# Patient Record
Sex: Female | Born: 1935 | Race: Black or African American | Hispanic: No | Smoking: Former smoker
Health system: Southern US, Community
[De-identification: ages and names within clinical notes are randomized; demographics above are authoritative.]

## PROBLEM LIST (undated history)

## (undated) DIAGNOSIS — D496 Neoplasm of unspecified behavior of brain: Secondary | ICD-10-CM

## (undated) DIAGNOSIS — K635 Polyp of colon: Secondary | ICD-10-CM

## (undated) DIAGNOSIS — J45909 Unspecified asthma, uncomplicated: Secondary | ICD-10-CM

## (undated) DIAGNOSIS — F0390 Unspecified dementia without behavioral disturbance: Secondary | ICD-10-CM

## (undated) DIAGNOSIS — M1990 Unspecified osteoarthritis, unspecified site: Secondary | ICD-10-CM

## (undated) DIAGNOSIS — K5900 Constipation, unspecified: Secondary | ICD-10-CM

## (undated) DIAGNOSIS — I10 Essential (primary) hypertension: Secondary | ICD-10-CM

## (undated) DIAGNOSIS — F1010 Alcohol abuse, uncomplicated: Secondary | ICD-10-CM

## (undated) DIAGNOSIS — Z531 Procedure and treatment not carried out because of patient's decision for reasons of belief and group pressure: Secondary | ICD-10-CM

## (undated) DIAGNOSIS — I671 Cerebral aneurysm, nonruptured: Secondary | ICD-10-CM

## (undated) DIAGNOSIS — IMO0001 Reserved for inherently not codable concepts without codable children: Secondary | ICD-10-CM

## (undated) HISTORY — DX: Unspecified osteoarthritis, unspecified site: M19.90

## (undated) HISTORY — PX: ABDOMINAL HYSTERECTOMY: SHX81

## (undated) HISTORY — DX: Constipation, unspecified: K59.00

## (undated) HISTORY — DX: Essential (primary) hypertension: I10

## (undated) HISTORY — DX: Unspecified dementia, unspecified severity, without behavioral disturbance, psychotic disturbance, mood disturbance, and anxiety: F03.90

## (undated) HISTORY — PX: BRAIN SURGERY: SHX531

## (undated) HISTORY — DX: Polyp of colon: K63.5

---

## 2008-02-14 ENCOUNTER — Ambulatory Visit: Payer: Self-pay | Admitting: Gastroenterology

## 2008-02-14 DIAGNOSIS — R933 Abnormal findings on diagnostic imaging of other parts of digestive tract: Secondary | ICD-10-CM | POA: Insufficient documentation

## 2008-02-14 DIAGNOSIS — D649 Anemia, unspecified: Secondary | ICD-10-CM | POA: Insufficient documentation

## 2008-02-19 ENCOUNTER — Encounter: Payer: Self-pay | Admitting: Gastroenterology

## 2008-02-19 ENCOUNTER — Ambulatory Visit: Payer: Self-pay | Admitting: Gastroenterology

## 2008-02-20 ENCOUNTER — Encounter: Payer: Self-pay | Admitting: Gastroenterology

## 2008-03-04 ENCOUNTER — Encounter: Payer: Self-pay | Admitting: Gastroenterology

## 2009-06-07 DIAGNOSIS — K635 Polyp of colon: Secondary | ICD-10-CM

## 2009-06-07 HISTORY — DX: Polyp of colon: K63.5

## 2011-02-05 ENCOUNTER — Encounter: Payer: Self-pay | Admitting: Gastroenterology

## 2012-02-25 ENCOUNTER — Encounter: Payer: Self-pay | Admitting: Gastroenterology

## 2013-09-06 ENCOUNTER — Emergency Department (HOSPITAL_COMMUNITY)
Admission: EM | Admit: 2013-09-06 | Discharge: 2013-09-06 | Disposition: A | Discharge status: Home / Self Care | Payer: Medicare Other | Attending: Emergency Medicine | Admitting: Emergency Medicine

## 2013-09-06 ENCOUNTER — Encounter (HOSPITAL_COMMUNITY): Payer: Self-pay | Admitting: Emergency Medicine

## 2013-09-06 ENCOUNTER — Emergency Department (HOSPITAL_COMMUNITY): Payer: Medicare Other

## 2013-09-06 DIAGNOSIS — Z86011 Personal history of benign neoplasm of the brain: Secondary | ICD-10-CM | POA: Insufficient documentation

## 2013-09-06 DIAGNOSIS — Z79899 Other long term (current) drug therapy: Secondary | ICD-10-CM | POA: Insufficient documentation

## 2013-09-06 DIAGNOSIS — Z8669 Personal history of other diseases of the nervous system and sense organs: Secondary | ICD-10-CM | POA: Insufficient documentation

## 2013-09-06 DIAGNOSIS — F10229 Alcohol dependence with intoxication, unspecified: Secondary | ICD-10-CM | POA: Insufficient documentation

## 2013-09-06 DIAGNOSIS — E876 Hypokalemia: Secondary | ICD-10-CM | POA: Insufficient documentation

## 2013-09-06 DIAGNOSIS — F17200 Nicotine dependence, unspecified, uncomplicated: Secondary | ICD-10-CM | POA: Insufficient documentation

## 2013-09-06 HISTORY — DX: Neoplasm of unspecified behavior of brain: D49.6

## 2013-09-06 HISTORY — DX: Alcohol abuse, uncomplicated: F10.10

## 2013-09-06 HISTORY — DX: Cerebral aneurysm, nonruptured: I67.1

## 2013-09-06 LAB — COMPREHENSIVE METABOLIC PANEL
ALBUMIN: 3.1 g/dL — AB (ref 3.5–5.2)
ALK PHOS: 94 U/L (ref 39–117)
ALT: 9 U/L (ref 0–35)
AST: 25 U/L (ref 0–37)
BUN: 8 mg/dL (ref 6–23)
CO2: 22 mEq/L (ref 19–32)
Calcium: 8.8 mg/dL (ref 8.4–10.5)
Chloride: 105 mEq/L (ref 96–112)
Creatinine, Ser: 0.69 mg/dL (ref 0.50–1.10)
GFR calc Af Amer: >90 mL/min (ref >90)
GFR calc non Af Amer: 81 mL/min — ABNORMAL LOW (ref >90)
Glucose, Bld: 118 mg/dL — ABNORMAL HIGH (ref 70–99)
POTASSIUM: 2.6 meq/L — AB (ref 3.7–5.3)
SODIUM: 149 meq/L — AB (ref 137–147)
TOTAL PROTEIN: 7.4 g/dL (ref 6.0–8.3)
Total Bilirubin: 0.4 mg/dL (ref 0.3–1.2)

## 2013-09-06 LAB — URINALYSIS, ROUTINE W REFLEX MICROSCOPIC
BILIRUBIN URINE: NEGATIVE
Glucose, UA: NEGATIVE mg/dL
Ketones, ur: NEGATIVE mg/dL
NITRITE: NEGATIVE
PH: 5.5 (ref 5.0–8.0)
Protein, ur: 100 mg/dL — AB
Urobilinogen, UA: 0.2 mg/dL (ref 0.0–1.0)

## 2013-09-06 LAB — URINE MICROSCOPIC-ADD ON

## 2013-09-06 LAB — CBC WITH DIFFERENTIAL/PLATELET
BASOS ABS: 0.1 10*3/uL (ref 0.0–0.1)
BASOS PCT: 1 % (ref 0–1)
EOS ABS: 0.4 10*3/uL (ref 0.0–0.7)
Eosinophils Relative: 8 % — ABNORMAL HIGH (ref 0–5)
HCT: 29.4 % — ABNORMAL LOW (ref 36.0–46.0)
Hemoglobin: 9.6 g/dL — ABNORMAL LOW (ref 12.0–15.0)
Lymphocytes Relative: 55 % — ABNORMAL HIGH (ref 12–46)
Lymphs Abs: 2.6 10*3/uL (ref 0.7–4.0)
MCH: 24.4 pg — AB (ref 26.0–34.0)
MCHC: 32.7 g/dL (ref 30.0–36.0)
MCV: 74.6 fL — ABNORMAL LOW (ref 78.0–100.0)
MONOS PCT: 9 % (ref 3–12)
Monocytes Absolute: 0.4 10*3/uL (ref 0.1–1.0)
NEUTROS PCT: 27 % — AB (ref 43–77)
Neutro Abs: 1.3 10*3/uL — ABNORMAL LOW (ref 1.7–7.7)
PLATELETS: 317 10*3/uL (ref 150–400)
RBC: 3.94 MIL/uL (ref 3.87–5.11)
RDW: 17.7 % — AB (ref 11.5–15.5)
WBC: 4.8 10*3/uL (ref 4.0–10.5)

## 2013-09-06 LAB — ETHANOL: ALCOHOL ETHYL (B): 319 mg/dL — AB (ref 0–11)

## 2013-09-06 MED ORDER — POTASSIUM CHLORIDE 10 MEQ/100ML IV SOLN: 10.0000 meq | INTRAVENOUS | Status: DC

## 2013-09-06 MED ORDER — POTASSIUM CHLORIDE CRYS ER 20 MEQ PO TBCR
40.0000 meq | EXTENDED_RELEASE_TABLET | Freq: Once | ORAL | Status: AC
  Administered 2013-09-06: 40 meq via ORAL
  Filled 2013-09-06: qty 2

## 2013-09-06 MED ORDER — SODIUM CHLORIDE 0.9 % IV BOLUS (SEPSIS): 1000.0000 mL | Freq: Once | INTRAVENOUS | Status: DC

## 2013-09-06 MED ORDER — POTASSIUM CHLORIDE CRYS ER 20 MEQ PO TBCR: 20.0000 meq | EXTENDED_RELEASE_TABLET | Freq: Every day | ORAL | Status: DC

## 2013-09-06 NOTE — ED Notes (Signed)
Critical Lab Value called to NF RN. Acuity changed to 2

## 2013-09-06 NOTE — ED Notes (Signed)
Pt. reports left sided headache onset yesterday , denies nausea or vomitting , no head injury or blurred vision . History of brain tumor , drank 1/2 pint of brandy today ( drinks everyday ) , disoriented to time / speech clear / no facial asymmetry / no arm drift.

## 2013-09-06 NOTE — ED Notes (Signed)
IV team paged to start IV.   

## 2013-09-06 NOTE — ED Notes (Signed)
Urine was clean catch

## 2013-09-06 NOTE — ED Provider Notes (Signed)
CSN: [id number]     Arrival date & time 09/06/13  1934 History   First MD Initiated Contact with Patient 09/06/13 2126     Chief Complaint  Patient presents with  . Headache     (Consider location/radiation/quality/duration/timing/severity/associated sxs/prior Treatment) HPI Comments: 80 y.o. AAF w/ PMHx of Brain tumor, Aneurysm s/p, EtOhism w/ cc: of HA. Pt comes in with daughter. Complaining of L sided HA for 2 days, now asymptomatic.. States its a dull pain on L side of head. Has hx of similar. Denies n/v, abd pain, chest pain, weakness, numbness. Has hx of aneurysm many years ago that was operated on. Also states she was in hospital admitted in [place] and was diagnosed with a "brain tumor" but was not to worry about it. Currently just moved here 1 month aago and not followed by anyone. Pt is an alcoholic and drank a pint and a half today.  Patient is a 80 y.o. female presenting with headaches. The history is provided by the patient and a relative.  Headache Pain location:  L temporal Quality:  Unable to specify Radiates to:  Does not radiate Pain severity now: none currently. Severity at highest:  Unable to specify Onset quality:  Gradual Duration:  2 days Timing:  Constant Progression:  Resolved Chronicity:  New Similar to prior headaches: yes   Context: not activity, not exposure to bright light, not eating, not loud noise and not straining   Relieved by:  Nothing Worsened by:  Nothing tried Ineffective treatments:  None tried Associated symptoms: no abdominal pain, no back pain, no congestion, no cough, no fever, no loss of balance, no nausea, no neck stiffness, no numbness, no photophobia, no seizures, no visual change, no vomiting and no weakness   Risk factors comment:  ? hx of brain tumor   Past Medical History  Diagnosis Date  . Alcohol abuse   . Brain tumor   . Brain aneurysm    History reviewed. No pertinent past surgical history. No family history on file. History   Substance Use Topics  . Smoking status: Never Smoker   . Smokeless tobacco: Current User    Types: Snuff  . Alcohol Use: Yes   OB History   Grav Para Term Preterm Abortions TAB SAB Ect Mult Living                 Review of Systems  Constitutional: Negative for fever, activity change and appetite change.  HENT: Negative for congestion and rhinorrhea.   Eyes: Negative for photophobia, discharge, redness and itching.  Respiratory: Negative for cough, shortness of breath and wheezing.   Cardiovascular: Negative for chest pain and palpitations.  Gastrointestinal: Negative for nausea, vomiting and abdominal pain.  Genitourinary: Negative for dysuria and decreased urine volume.  Musculoskeletal: Negative for back pain and neck stiffness.  Skin: Negative for rash.  Neurological: Positive for headaches. Negative for seizures, syncope, weakness, numbness and loss of balance.      Allergies  Fexofenadine  Home Medications   Current Outpatient Rx  Name  Route  Sig  Dispense  Refill  . acetaminophen (TYLENOL) 500 MG tablet   Oral   Take 500 mg by mouth daily as needed (pain).         Marland Kitchen docusate sodium (COLACE) 100 MG capsule   Oral   Take 100 mg by mouth daily as needed for mild constipation.         . magnesium hydroxide (MILK OF MAGNESIA) 400 MG/5ML suspension  Oral   Take by mouth daily as needed for mild constipation.         Marland Kitchen PRESCRIPTION MEDICATION   Oral   Take 1 tablet by mouth daily. Blood pressure medication         . PRESCRIPTION MEDICATION   Oral   Take 1 tablet by mouth daily. Heart medication         . potassium chloride SA (K-DUR,KLOR-CON) 20 MEQ tablet   Oral   Take 1 tablet (20 mEq total) by mouth daily.   30 tablet   0    BP 150/69  Pulse 90  Temp(Src) 99.1 F (37.3 C) (Oral)  Resp 16  SpO2 99% Physical Exam  Constitutional: She is oriented to person, place, and time. She appears well-developed and well-nourished. No distress.  Pt  appears intoxicated, very pleasant, joking.  HENT:  Head: Normocephalic and atraumatic.  Mouth/Throat: Oropharynx is clear and moist.  Eyes: Conjunctivae and EOM are normal. Pupils are equal, round, and reactive to light. Right eye exhibits no discharge. Left eye exhibits no discharge. No scleral icterus.  Neck: Normal range of motion. Neck supple.  Cardiovascular: Normal rate, regular rhythm and intact distal pulses.  Exam reveals no gallop and no friction rub.   No murmur heard. Pulmonary/Chest: Effort normal and breath sounds normal. No respiratory distress. She has no wheezes. She has no rales.  Abdominal: Soft. She exhibits no distension and no mass. There is no tenderness.  Musculoskeletal: Normal range of motion.  Neurological: She is alert and oriented to person, place, and time. No cranial nerve deficit. She exhibits normal muscle tone. Coordination normal.  5/5 strength in all exts, normal sensation in all exts, 2+ DTRs in patella and brachioradilias b/l, able to ambulate, mild ataxia on F2N b/l c/w EtOh intoxciation, CNs II-XII intcat  Skin: She is not diaphoretic.    ED Course  Procedures (including critical care time) Labs Review Labs Reviewed  CBC WITH DIFFERENTIAL - Abnormal; Notable for the following:    Hemoglobin 9.6 (*)    HCT 29.4 (*)    MCV 74.6 (*)    MCH 24.4 (*)    RDW 17.7 (*)    Neutrophils Relative % 27 (*)    Neutro Abs 1.3 (*)    Lymphocytes Relative 55 (*)    Eosinophils Relative 8 (*)    All other components within normal limits  COMPREHENSIVE METABOLIC PANEL - Abnormal; Notable for the following:    Sodium 149 (*)    Potassium 2.6 (*)    Glucose, Bld 118 (*)    Albumin 3.1 (*)    GFR calc non Af Amer 81 (*)    All other components within normal limits  ETHANOL - Abnormal; Notable for the following:    Alcohol, Ethyl (B) 319 (*)    All other components within normal limits  URINALYSIS, ROUTINE W REFLEX MICROSCOPIC - Abnormal; Notable for the  following:    APPearance CLOUDY (*)    Specific Gravity, Urine >1.030 (*)    Hgb urine dipstick TRACE (*)    Protein, ur 100 (*)    Leukocytes, UA TRACE (*)    All other components within normal limits  URINE MICROSCOPIC-ADD ON - Abnormal; Notable for the following:    Squamous Epithelial / LPF FEW (*)    Bacteria, UA FEW (*)    All other components within normal limits   Imaging Review Ct Head Wo Contrast  09/06/2013   CLINICAL DATA:  Left-sided  headache.  History of brain tumor.  EXAM: CT HEAD WITHOUT CONTRAST  TECHNIQUE: Contiguous axial images were obtained from the base of the skull through the vertex without intravenous contrast.  COMPARISON:  None available for comparison at time of study interpretation.  FINDINGS: No intraparenchymal hemorrhage, mass effect, midline shift. Status post left frontal craniectomy with apparent dural flap. The ventricles and sulci are overall normal for patient's age with mild disproportionate cerebellar volume loss as evidenced by prominent cerebellar folia. Patchy to confluent supratentorial white matter hypodensities.  No abnormal extra-axial fluid collections. Moderate to severe calcific atherosclerosis of the carotid siphons. Status post bilateral ocular lens implants.  Mild bony wall thickening of the maxillary sinuses suggest chronic sinusitis with pan paranasal sinus mucosal thickening. Remote left medial orbital blowout fracture. No skull fracture. Mastoid air cells are well aerated.  IMPRESSION: No acute intracranial process.  Status post left frontal craniectomy for probable extra-axial tumor resection.  Moderate global brain atrophy, with somewhat disproportionate cerebellar volume loss. Moderate to severe white matter changes suggest chronic small vessel ischemic disease.   Electronically Signed   By: Elon Alas   On: 09/06/2013 22:05     EKG Interpretation None      MDM   MDM 80 y.o. AAF w/ PMHx of EtOH abuse, ?brain tumor, hx of  aneurysms, comes in w/ HA. 2 days of L sided dull HA. None currently. Denies weaknes,s numbness, n/v/d, abd pain, chest pain, SOB. Pt AFVSS, intoxicated here, NAD otherwise. No neuro deficits. Labs show mild hypokalemia, dehydrated, hypernatremia. CT scan with post surgical changes. Pt's daughter states they jut moved from [place] 1 month ago and while up there had a hospital stay for a "brain tumor" that they decided to not do anything for. Plan to give fluids, and po K+. Pt alert, oriented, intoxicated, refusing IV. Pt states she feels fine and is ready to leave after CT scan. D/w CT with patient and daughter that shows no obvious lesion but MRI is test of choice. Pt hypokalemia likely incidental, dehydrated likely from EtOH. Pt refusing IV fluids, and daughter wants to bring patient home. Will give KDUr here and instruct to take K daily, rx given. Instructed to drink fluids. Given information for f/u w/ a PCP. Afebrile, no current ha, no neck pain making meningitis unlikely. No HA currently, making ICH unlikely, esp w/ negative Head CT. No obvious malignancy, no signs of increased ICP. Will dishcarge. Care of case d/w my attending.  Final diagnoses:  Hypokalemia    Discharged   Sol Passer, MD 09/06/13 2336

## 2013-09-06 NOTE — Discharge Instructions (Signed)
Hypokalemia Hypokalemia means that the amount of potassium in the blood is lower than normal.Potassium is a chemical, called an electrolyte, that helps regulate the amount of fluid in the body. It also stimulates muscle contraction and helps nerves function properly.Most of the body's potassium is inside of cells, and only a very small amount is in the blood. Because the amount in the blood is so small, minor changes can be life-threatening. CAUSES  Antibiotics.  Diarrhea or vomiting.  Using laxatives too much, which can cause diarrhea.  Chronic kidney disease.  Water pills (diuretics).  Eating disorders (bulimia).  Low magnesium level.  Sweating a lot. SIGNS AND SYMPTOMS  Weakness.  Constipation.  Fatigue.  Muscle cramps.  Mental confusion.  Skipped heartbeats or irregular heartbeat (palpitations).  Tingling or numbness. DIAGNOSIS  Your health care provider can diagnose hypokalemia with blood tests. In addition to checking your potassium level, your health care provider may also check other lab tests. TREATMENT Hypokalemia can be treated with potassium supplements taken by mouth or adjustments in your current medicines. If your potassium level is very low, you may need to get potassium through a vein (IV) and be monitored in the hospital. A diet high in potassium is also helpful. Foods high in potassium are:  Nuts, such as peanuts and pistachios.  Seeds, such as sunflower seeds and pumpkin seeds.  Peas, lentils, and lima beans.  Whole grain and bran cereals and breads.  Fresh fruit and vegetables, such as apricots, avocado, bananas, cantaloupe, kiwi, oranges, tomatoes, asparagus, and potatoes.  Orange and tomato juices.  Red meats.  Fruit yogurt. HOME CARE INSTRUCTIONS  Take all medicines as prescribed by your health care provider.  Maintain a healthy diet by including nutritious food, such as fruits, vegetables, nuts, whole grains, and lean meats.  If  you are taking a laxative, be sure to follow the directions on the label. SEEK MEDICAL CARE IF:  Your weakness gets worse.  You feel your heart pounding or racing.  You are vomiting or having diarrhea.  You are diabetic and having trouble keeping your blood glucose in the normal range. SEEK IMMEDIATE MEDICAL CARE IF:  You have chest pain, shortness of breath, or dizziness.  You are vomiting or having diarrhea for more than 2 days.  You faint. MAKE SURE YOU:   Understand these instructions.  Will watch your condition.  Will get help right away if you are not doing well or get worse. Document Released: 05/24/2005 Document Revised: 03/14/2013 Document Reviewed: 11/24/2012 [facility] Patient Information 2014 [place].   Emergency Department Resource Guide 1) Find a Doctor and Pay Out of Pocket Although you won't have to find out who is covered by your insurance plan, it is a good idea to ask around and get recommendations. You will then need to call the office and see if the doctor you have chosen will accept you as a new patient and what types of options they offer for patients who are self-pay. Some doctors offer discounts or will set up payment plans for their patients who do not have insurance, but you will need to ask so you aren't surprised when you get to your appointment.  2) Contact Your Local Health Department Not all health departments have doctors that can see patients for sick visits, but many do, so it is worth a call to see if yours does. If you don't know where your local health department is, you can check in your phone book. The CDC also  has a tool to help you locate your state's health department, and many state websites also have listings of all of their local health departments.  3) Find a [facility] If your illness is not likely to be very severe or complicated, you may want to try a walk in clinic. These are popping up all over the country in  pharmacies, drugstores, and shopping centers. They're usually staffed by nurse practitioners or physician assistants that have been trained to treat common illnesses and complaints. They're usually fairly quick and inexpensive. However, if you have serious medical issues or chronic medical problems, these are probably not your best option.  No Primary Care Doctor: - Call Health Connect at  [phone] - they can help you locate a primary care doctor that  accepts your insurance, provides certain services, etc. - Physician Referral Service- [phone]  Chronic Pain Problems: Organization         Address  Phone   Notes  [facility]  [phone] Patients need to be referred by their primary care doctor.   Medication Assistance: Organization         Address  Phone   Notes  [facility] Medication [facility] [address] [place], [place] [address] [phone] --Must be a resident of [facility] -- Must have NO insurance coverage whatsoever (no Medicaid/ Medicare, etc.) -- The pt. MUST have a primary care doctor that directs their care regularly and follows them in the community   MedAssist  [phone]   [employer]  [phone]    Agencies that provide inexpensive medical care: Organization         Address  Phone   Notes  [place]  [phone]   [facility]    [phone]   [facility] [place], [place] [address] [phone]   [place] [address] [place] [phone]   Planned Parenthood    [phone]   [facility]    [phone]   [place] [address], [place] Phone:  [phone], Fax:  [phone] Hours of Operation:  9 am - 6 pm, M-F.  Also accepts Medicaid/Medicare and self-pay.  [facility] for [place], [address],  [place] Phone: [phone], Fax: [phone]. Hours of Operation:  8:30 am - 5:30 pm, M-F.  Also accepts Medicaid and self-pay.  [facility] [place] [address] [place] Phone: [phone]   [place], [place], [place] [phone], Ext. [phone] Mondays & Thursdays: 7-9 AM.  First 15 patients are seen on a first come, first serve basis.    [place] Providers:  Organization         Address  Phone   Notes  [facility] [address], [place] [phone] Also accepts self-pay patients.  [facility] [address], [place]  [phone]   [place], [address], [place] [phone]   [facility] [address], [place] [phone]   Lucianne Lei [address], [place]   [phone] Only accepts [place] Access [place] patients after they have their name applied to their card.   Self-Pay (no insurance) in  [facility]:  Organization         Address  Phone   Notes  Sickle Cell Patients, [facility] [place] [phone]   [facility] [place] [phone]   [facility] [place]  [place], [address], [place] [phone]   [facility]/Dr. Osei-Bonsu  [address], [place] or [address], [place] [phone] Phone number for both [place] and [place] locations is the same.  [facility] [address], [place] [phone]   [facility] [address], [place] or [address] [phone]   [facility] [address] [place] [phone], phone; [phone], fax Sees patients 1st and 3rd Saturday of every month.  Must not  qualify for public or private insurance (i.e. Medicaid, Medicare, [place] Health Choice, Veterans' Benefits)  Household income should be no more than 200% of the poverty level The clinic cannot treat you if you are pregnant or think you are pregnant  Sexually transmitted diseases are not treated at the clinic.    Dental Care: Organization         Address  Phone  Notes  [facility] Department of [facility] [place] [phone] Accepts children up to age 37 who are enrolled in [place] or [place]; pregnant women with a Medicaid card; and children who have applied for Medicaid or [place] Health Choice, but were declined, whose parents can pay a reduced fee at time of service.  [facility] of [facility]  [address], [place] [phone] Accepts children up to age 81 who are enrolled in [place] or [place]; pregnant women with a Medicaid card; and children who have applied for Medicaid or  Health Choice, but were declined, whose parents can pay a reduced fee at time of service.  [place] Adult Dental Access PROGRAM  [place] [phone] Patients are seen by appointment only. Walk-ins are not accepted. [place] will see patients 57 years of age and older. Monday - Tuesday (8am-5pm) Most Wednesdays (8:30-5pm) $30 per visit, cash only  [facility] Adult Dental Access PROGRAM  [address], [facility] [phone] Patients are seen by appointment only. Walk-ins are not accepted. [place] will see patients 47 years of age and older. One Wednesday Evening (Monthly: Volunteer Based).  $30 per visit, cash only  [place]  [phone] for adults; Children under age 50, call Graduate Pediatric Dentistry at [phone]. Children aged 45-14, please call [phone] to request a pediatric application.  Dental services are provided  in all areas of dental care including fillings, crowns and bridges, complete and partial dentures, implants, gum treatment, root canals, and extractions. Preventive care is also provided. Treatment is provided to both adults and children. Patients are selected via a lottery and there is often a waiting list.   [facility] [address] [place]  [phone] [email or web address]   [facility] [address] [place], [place] [phone], Ext. [phone] Second and Fourth Thursday of each month, opens at 6:30 AM; Clinic ends at 9 AM.  Patients are seen on a first-come first-served basis, and a limited number are seen during each  clinic.   [facility]  [address] Hillard Danker [place], [place] [phone]   Eligibility Requirements You must have lived in [place], [place], or [place] counties for at least the last three months.   You cannot be eligible for state or federal sponsored [employer], including [employer], [place], or [employer].   You generally cannot be eligible for healthcare insurance through your employer.    How to apply: Eligibility screenings are held every Tuesday and Wednesday afternoon from 1:00 pm until 4:00 pm. You do not need an appointment for the interview!  [facility] [address], [place], [place]   [place]  [place] Department  [place]  [phone]    Behavioral Health Resources in the Community: Intensive Outpatient Programs Organization         Address  Phone  Notes  [place] Carlock. [address] [place], [place] [phone]   [facility]patient [address], [place], [place]   ADS: Alcohol & Drug Svcs [address] [place], [place]   [place] [address],  [place], [place] or [phone]   Substance Abuse Resources Organization         Address  Phone  Notes  Alcohol and Drug Services  [phone]   [place]  [phone]   [place]   Chinita Pester  [phone]   Residential & Outpatient Substance Abuse Program  [phone]   Psychological Services Organization         Address  Phone  Notes  [facility] [place]  [place]  [phone]   [place] [address] [place] or [phone]    Mobile Crisis Teams Organization         Address  Phone  Notes  [facility], Mobile Crisis Care Unit  [phone]   Assertive Psychotherapeutic Services  [address] [place], [employer]   [place] [address] [place] [phone]    Self-Help/Support Groups Organization         Address  Phone             Notes  [place]. of [place] - variety of support groups  [place] Call for more information  Narcotics Anonymous (NA), Caring Services [address], [employer]   2 meetings at this location   Special educational needs teacher         Address  Phone  Notes  ASAP Residential Treatment [place],    [place]  [phone]   [facility]  [address] [place] [id number], [place], [place]   [place], [place] [phone] Admissions: 8am-3pm M-F  Incentives Substance [place] [address]    [place], [place] [phone]   [facility] [address] Jadene Pierini [place], [place]   [facility] [address],  [place], [place]   [facility] Programs - Intensive Outpatient [address], [place], [place]   [facility] ([place].) [place].,  [place], [place] or  [phone]   [facility] (RTS) [address], [place], [place] Accepts Medicaid  [facility] [place] [address]  [place]  [phone] Substance Abuse/Addiction Treatment   [facility] Organization         Address  Phone  Notes  [facility]  [phone]   Domenic Schwab, PhD [address] Arlis Porta [place], [place]   [phone] or [phone]   [place], [place] [phone]   [address], [place], [place] [phone] Insurance/Medicaid/sponsorship through [facility] and Families [address], [address] [place]                                    [place], [place] [phone] [place] [address], [place] [phone]    Dr. Adele Schilder  [phone]   [facility] Dept. 1) [address], [place] 2) [place] 3)  [place] [address], [place] [phone]  [phone]   [place] [phone] or [phone] (After Hours)

## 2013-09-06 NOTE — ED Notes (Signed)
Change of plans, pt does not wish to have an IV, MD aware and OK with change of plans.

## 2013-09-10 NOTE — ED Provider Notes (Signed)
I saw and evaluated the patient, reviewed the resident's note and I agree with the findings and plan.   EKG Interpretation None      Laura May is a 80 y.o. female hx of ETOH abuse, ? Brain tube and hx of aneurysms here with HA. Dull headache for 2 days, still drinking alcohol daily. Nonfocal neuro exam. CT head showed no obvious mass or bleed. K 2.6, likely from alcohol use. Ordered IV and PO potassium but she refused IV. She is slightly intoxicated but daughter is here with patient. Given PO potassium and will d/c home on K 20 meq daily. Recommend recheck K in a week and stop drinking alcohol. I offered admission for K replacement but she refused    Wandra Arthurs, MD 09/10/13 0730

## 2013-09-29 ENCOUNTER — Ambulatory Visit (INDEPENDENT_AMBULATORY_CARE_PROVIDER_SITE_OTHER): Payer: Medicare Other | Admitting: Family Medicine

## 2013-09-29 ENCOUNTER — Ambulatory Visit: Payer: Medicare Other

## 2013-09-29 VITALS — BP 180/92 | HR 114 | Temp 99.5°F | Resp 16 | Ht <= 58 in | Wt 103.0 lb

## 2013-09-29 DIAGNOSIS — M25539 Pain in unspecified wrist: Secondary | ICD-10-CM

## 2013-09-29 DIAGNOSIS — S60229A Contusion of unspecified hand, initial encounter: Secondary | ICD-10-CM

## 2013-09-29 DIAGNOSIS — M79609 Pain in unspecified limb: Secondary | ICD-10-CM

## 2013-09-29 DIAGNOSIS — S60221A Contusion of right hand, initial encounter: Secondary | ICD-10-CM

## 2013-09-29 DIAGNOSIS — M25531 Pain in right wrist: Secondary | ICD-10-CM

## 2013-09-29 DIAGNOSIS — M79643 Pain in unspecified hand: Secondary | ICD-10-CM

## 2013-09-29 NOTE — Patient Instructions (Signed)
1. Take Tylenol extra strength 500mg -- 2 tablets every 6-8 hours.  2.  Ice hand for 15-20 minutes three times daily. 3. Wear sling to elevate hand for the next 72 hours. 4. If R hand is not much better in three days, return to office.

## 2013-09-29 NOTE — Progress Notes (Addendum)
This chart was scribed for Laura Honour, MD by Laura May, ED Scribe. This patient was seen in room 4 and the patient's care was started at 7:48 PM. Subjective:    Patient ID: Laura May, female    DOB: 07-30-1931, 80 y.o.   MRN: [id number]  09/29/2013  Wrist Pain   HPI HPI Comments: Laura May is a 80 y.o. female who presents with her daughter to [facility] complaining of an injury that occurred this morning to her R wrist/hand. Pt states that she was walking up the stairs when she hit her right wrist on the rail of the stairs. She is complaining of associated swelling and pain. Pt's range of motion in her right hand has been limited secondary to pain. She is currently unable to move her fingers or elevate her arm due to pain. Pt states that the pain started worsening after her shower at 7AM. He denies any numbness or tingling in the hand. Denies fingers turning blue.   Review of Systems  Constitutional: Negative for fever, chills, diaphoresis, appetite change and fatigue.  Eyes: Negative for visual disturbance.  Musculoskeletal: Positive for arthralgias (right wrist), joint swelling (right wrist) and myalgias.  Skin: Negative for color change, pallor, rash and wound.  Neurological: Negative for weakness and numbness.  Hematological: Does not bruise/bleed easily.    Past Medical History  Diagnosis Date  . Alcohol abuse   . Brain tumor   . Brain aneurysm   . Arthritis   . Hypertension    Allergies  Allergen Reactions  . Fexofenadine Other (See Comments)    Unknown reaction   Current Outpatient Prescriptions  Medication Sig Dispense Refill  . docusate sodium (COLACE) 100 MG capsule Take 100 mg by mouth daily as needed for mild constipation.      Marland Kitchen losartan (COZAAR) 50 MG tablet Take 50 mg by mouth daily.      . magnesium hydroxide (MILK OF MAGNESIA) 400 MG/5ML suspension Take by mouth daily as needed for mild constipation.      . potassium  chloride SA (K-DUR,KLOR-CON) 20 MEQ tablet Take 1 tablet (20 mEq total) by mouth daily.  30 tablet  0  . acetaminophen (TYLENOL) 500 MG tablet Take 500 mg by mouth daily as needed (pain).      Marland Kitchen PRESCRIPTION MEDICATION Take 1 tablet by mouth daily. Blood pressure medication      . PRESCRIPTION MEDICATION Take 1 tablet by mouth daily. Heart medication       No current facility-administered medications for this visit.       Objective:    BP 180/92  Pulse 114  Temp(Src) 99.5 F (37.5 C) (Oral)  Resp 16  Ht 4\' 7"  (1.397 m)  Wt 103 lb (46.72 kg)  BMI 23.94 kg/m2  SpO2 97%  Physical Exam  Nursing note and vitals reviewed. Constitutional: She appears well-developed and well-nourished. No distress.  HENT:  Head: Normocephalic and atraumatic.  Eyes: Conjunctivae and EOM are normal. Pupils are equal, round, and reactive to light. Right eye exhibits no discharge. Left eye exhibits no discharge.  Cardiovascular: Exam reveals no gallop.   Pulses:      Radial pulses are 2+ on the right side, and 2+ on the left side.  Capillary refill < 3 seconds.  Pulmonary/Chest: Effort normal. No respiratory distress.  Musculoskeletal: She exhibits no edema.       Right shoulder: Normal.       Right elbow: Normal.No radial  head, no medial epicondyle, no lateral epicondyle and no olecranon process tenderness noted.       Right wrist: She exhibits decreased range of motion, tenderness, bony tenderness and swelling. She exhibits no crepitus, no deformity and no laceration.       Right hand: She exhibits decreased range of motion, tenderness, bony tenderness and swelling. She exhibits normal two-point discrimination, normal capillary refill, no deformity and no laceration. Normal sensation noted. Decreased strength noted.  R WRIST/HAND:  +Swelling and tenderness to the right distal forearm to the wrist into the right hand.  Diffuse swelling of distal forearm, wrist, hand, fingers diffusely.  Pain with palpation  along wrist diffusely, hand.  Mild TTP diffusely fingers.  Neurological: She is alert.  Skin: Skin is warm and dry. She is not diaphoretic.  Psychiatric: She has a normal mood and affect. Her behavior is normal. Thought content normal.    [facility] reading (PRIMARY) by  Dr. [place] WRIST AND HAND: diffuse degenerative changes; NAD.  TYLENOL 500MG  TWO TABLETS ADMINISTERED PO DURING VISIT.    Assessment & Plan:   1. Hand pain   2. Wrist pain, right   3. Contusion of right hand    1.  R hand swelling/contusion:  New.  No evidence of acute fracture; recommend rest, elevation, icing.  Sling placed in office to assist with elevation of hand. If no improvement in pain and swelling in upcoming 48 hours, RTC for reevaluation.  Continue Tylenol ES for pain.  Diffuse degenerative changes in hand and wrist.  Meds ordered this encounter  Medications  . losartan (COZAAR) 50 MG tablet    Sig: Take 50 mg by mouth daily.    No Follow-up on file.   I personally performed the services described in this documentation, which was scribed in my presence. The recorded information has been reviewed and is accurate.  Laura May, M.D.  [facility] [address] [place], [place]  [address] [phone] phone [phone] fax

## 2013-10-14 ENCOUNTER — Ambulatory Visit: Payer: Medicare Other

## 2013-10-14 ENCOUNTER — Ambulatory Visit (INDEPENDENT_AMBULATORY_CARE_PROVIDER_SITE_OTHER): Payer: Medicare Other | Admitting: Family Medicine

## 2013-10-14 VITALS — BP 140/80 | HR 78 | Temp 98.7°F | Resp 16 | Ht <= 58 in | Wt 103.4 lb

## 2013-10-14 DIAGNOSIS — M25519 Pain in unspecified shoulder: Secondary | ICD-10-CM

## 2013-10-14 DIAGNOSIS — M25511 Pain in right shoulder: Secondary | ICD-10-CM

## 2013-10-14 DIAGNOSIS — G4700 Insomnia, unspecified: Secondary | ICD-10-CM

## 2013-10-14 DIAGNOSIS — K219 Gastro-esophageal reflux disease without esophagitis: Secondary | ICD-10-CM

## 2013-10-14 DIAGNOSIS — F1020 Alcohol dependence, uncomplicated: Secondary | ICD-10-CM | POA: Insufficient documentation

## 2013-10-14 DIAGNOSIS — F0390 Unspecified dementia without behavioral disturbance: Secondary | ICD-10-CM

## 2013-10-14 DIAGNOSIS — I10 Essential (primary) hypertension: Secondary | ICD-10-CM | POA: Insufficient documentation

## 2013-10-14 DIAGNOSIS — E876 Hypokalemia: Secondary | ICD-10-CM

## 2013-10-14 DIAGNOSIS — M19019 Primary osteoarthritis, unspecified shoulder: Secondary | ICD-10-CM

## 2013-10-14 DIAGNOSIS — D649 Anemia, unspecified: Secondary | ICD-10-CM

## 2013-10-14 LAB — IRON AND TIBC
%SAT: 6 % — ABNORMAL LOW (ref 20–55)
IRON: 23 ug/dL — AB (ref 42–145)
TIBC: 366 ug/dL (ref 250–470)
UIBC: 343 ug/dL (ref 125–400)

## 2013-10-14 LAB — COMPREHENSIVE METABOLIC PANEL
ALK PHOS: 45 U/L (ref 39–117)
ALT: <8 U/L (ref 0–35)
AST: 14 U/L (ref 0–37)
Albumin: 3.5 g/dL (ref 3.5–5.2)
BILIRUBIN TOTAL: 0.3 mg/dL (ref 0.2–1.2)
BUN: 7 mg/dL (ref 6–23)
CO2: 26 meq/L (ref 19–32)
Calcium: 9.3 mg/dL (ref 8.4–10.5)
Chloride: 105 mEq/L (ref 96–112)
Creat: 0.67 mg/dL (ref 0.50–1.10)
Glucose, Bld: 88 mg/dL (ref 70–99)
Potassium: 3.2 mEq/L — ABNORMAL LOW (ref 3.5–5.3)
Sodium: 143 mEq/L (ref 135–145)
TOTAL PROTEIN: 6.5 g/dL (ref 6.0–8.3)

## 2013-10-14 LAB — POCT CBC
Granulocyte percent: 70 %G (ref 37–80)
HEMATOCRIT: 30.9 % — AB (ref 37.7–47.9)
HEMOGLOBIN: 9.9 g/dL — AB (ref 12.2–16.2)
LYMPH, POC: 1.4 (ref 0.6–3.4)
MCH: 22.7 pg — AB (ref 27–31.2)
MCHC: 32 g/dL (ref 31.8–35.4)
MCV: 70.8 fL — AB (ref 80–97)
MID (cbc): 0.5 (ref 0–0.9)
MPV: 7.9 fL (ref 0–99.8)
POC Granulocyte: 4.5 (ref 2–6.9)
POC LYMPH %: 22.6 % (ref 10–50)
POC MID %: 7.4 %M (ref 0–12)
Platelet Count, POC: 662 10*3/uL — AB (ref 142–424)
RBC: 4.36 M/uL (ref 4.04–5.48)
RDW, POC: 17.3 %
WBC: 6.4 10*3/uL (ref 4.6–10.2)

## 2013-10-14 LAB — MAGNESIUM: MAGNESIUM: 1.3 mg/dL — AB (ref 1.5–2.5)

## 2013-10-14 MED ORDER — LOSARTAN POTASSIUM 50 MG PO TABS: 50.0000 mg | ORAL_TABLET | Freq: Every day | ORAL | Status: DC

## 2013-10-14 MED ORDER — MIRTAZAPINE 15 MG PO TABS: 15.0000 mg | ORAL_TABLET | Freq: Every day | ORAL | Status: DC

## 2013-10-14 MED ORDER — PANTOPRAZOLE SODIUM 40 MG PO TBEC: 40.0000 mg | DELAYED_RELEASE_TABLET | Freq: Every day | ORAL | Status: DC

## 2013-10-14 MED ORDER — MELOXICAM 15 MG PO TABS: 15.0000 mg | ORAL_TABLET | Freq: Every day | ORAL | Status: DC

## 2013-10-14 NOTE — Patient Instructions (Signed)
1.  Start multivitamin for seniors one tablet daily. 2.  Start Tylenol 500mg  one tablet every eight hours as needed for pain.

## 2013-10-14 NOTE — Progress Notes (Signed)
Subjective:  This chart was scribed for Reginia Forts, MD by Donato Schultz, Medical Scribe. This patient was seen in Room 10 and the patient's care was started at 8:09 AM.   Patient ID: Laura May, female    DOB: 01-Jan-1932, 80 y.o.   MRN: [id number]  Arthritis She reports no joint swelling. Pertinent negatives include no diarrhea or rash.   HPI Comments: Laura May is a 80 y.o. female with a history of constipation and anemia who presents to the [facility] complaining of worsening arthritic pain located in her shoulders bilaterally and lower back pain that started two weeks ago.  She states that she will experience this pain everyday.  The patient states that the pain she experiences will inhibit her from sleeping.  She lists mild neck pain as an associated symptom.  She states that she will take two OTC pain relief medications (Naproxen) a day with no relief to her pain.  The patient's daughter states that she fell when she came to [place] due to her drinking but has not fallen since then.    She states that she has been anemic for "quite some time" and takes iron for her symptoms.  She states that she has a bowel movement everyday.  The patient denies hematochezia and melena as associated symptoms.  The patient states that she does not have a history of stomach ulcers or heartburn.  The patient states that she wears Depends as she will leak urine throughout the day.  The patient states that she will take a multivitamin everyday.  Colonoscopy in 2009.  She states that she had surgery to remove a blood clot on her brain 8 years ago.  She states that she has a history of hysterectomy and oophorectomy after experiencing menorrhagia.  She states that her last colonoscopy was three years ago which revealed polyps and a hemorrhoid.  Her daughter states that she had the polyps removed and the hemorrhoids treated.   She states that her last mammogram was a year ago and was  normal.    The patient states that she was placed in a nursing home for a month after her potassium levels were low and she was experiencing confusion.  The doctors at the time diagnosed her with dementia. Previous MD recommended that patient not live alone or cook; she has a history of leaving the stove on while cooking.   The patient states that she was widowed when she was 80 years old.  The patient has two daughters, two grandchildren, and seven great grandchildren.  She states that she moved to [place] on March 7th, 2015 from [place] and lives with her daughter.  The patient states that she quit smoking 15 years ago but she currently dips snuff.  The patient states that she used to drink a half a pint of brandy everyday but she has not had an alcoholic beverage since she last came to [facility] to treat right hand pain.  The patient states that she does not drive.  She states that she is able to dress herself but does not do a majority of the cooking because she will forget to turn the stove off.  The patient's daughter states that she notices the patient's dementia symptoms worsen on rainy days and she will frequently ask the same questions over repeatedly and ask to see her dead son.    Upon review of chart, ED visit in 08/2013 for headache.  Reported  brain tumor that was inoperable due to age and dementia.  CT head negative; ED physician recommended MRI to further evaluate.  Previous surgery of brain noted on ED for "clot on the brain".     Past Medical History  Diagnosis Date  . Alcohol abuse   . Brain tumor   . Brain aneurysm   . Arthritis   . Hypertension   . Constipation     last colonoscopy 2011  . Colon polyps 06/07/2009  . Dementia    Past Surgical History  Procedure Laterality Date  . Brain surgery    . Abdominal hysterectomy      DUB; removed B ovaries.    Past Surgical History  Procedure Laterality Date  . Brain surgery    . Abdominal hysterectomy      DUB; removed B  ovaries.   Family History  Problem Relation Age of Onset  . Cancer Mother   . Heart disease Father    History   Social History  . Marital Status: Widowed    Spouse Name: N/A    Number of Children: N/A  . Years of Education: N/A   Occupational History  . Not on file.   Social History Main Topics  . Smoking status: Never Smoker   . Smokeless tobacco: Current User    Types: Snuff  . Alcohol Use: Yes  . Drug Use: No  . Sexual Activity: Not Currently   Other Topics Concern  . Not on file   Social History Narrative   Marital status: widowed since age 50; second husband.      Children: two children; 2 grandchildren; 7 gg      Lives: with daughter; moved from [place].      Employment: retired; previous Land work.      Tobacco: quit 15 years ago; dips snuff.      Alcohol:  Daily alcohol brandy 1/2 pint-1 pint; years.      ADLS:  No driving; never drove. Rare cooking; daughter cooks.     Allergies  Allergen Reactions  . Fexofenadine Other (See Comments)    Unknown reaction    Review of Systems  Constitutional: Positive for appetite change (decreased).  Gastrointestinal: Negative for nausea, vomiting, abdominal pain, diarrhea and constipation.  Musculoskeletal: Positive for arthralgias, arthritis, back pain and neck pain. Negative for joint swelling.  Skin: Negative for rash.  Neurological: Negative for weakness.  Psychiatric/Behavioral: Positive for sleep disturbance.       Memory loss.  All other systems reviewed and are negative.    Objective:  Physical Exam  Nursing note and vitals reviewed. Constitutional: She is oriented to person, place, and time. She appears well-developed and well-nourished. No distress.  HENT:  Head: Normocephalic and atraumatic.  Right Ear: Tympanic membrane, external ear and ear canal normal.  Left Ear: Tympanic membrane, external ear and ear canal normal.  Nose: Nose normal.  Mouth/Throat: Uvula is midline, oropharynx is clear and  moist and mucous membranes are normal. No oropharyngeal exudate, posterior oropharyngeal edema or posterior oropharyngeal erythema.  Eyes: Conjunctivae and EOM are normal. Pupils are equal, round, and reactive to light.  Neck: Normal range of motion. Neck supple. No thyromegaly present.  Cardiovascular: Normal rate, regular rhythm and normal heart sounds.  Exam reveals no gallop and no friction rub.   No murmur heard. Pulmonary/Chest: Effort normal and breath sounds normal. No respiratory distress. She has no wheezes. She has no rales.  Abdominal: Soft. Bowel sounds are normal. She exhibits no  distension. There is no tenderness. There is no rebound and no guarding.  Musculoskeletal:       Right shoulder: She exhibits decreased range of motion.       Left shoulder: She exhibits decreased range of motion.       Cervical back: She exhibits normal range of motion.       Lumbar back: She exhibits normal range of motion.  Able to actively elevate shoulders to 120 degrees.  Full range of motion cervical spine without pain or limitation.  Full range of motion lumbar spine.   Lymphadenopathy:    She has no cervical adenopathy.  Neurological: She is alert and oriented to person, place, and time. No cranial nerve deficit. She exhibits normal muscle tone. Coordination normal.  Skin: Skin is warm and dry. She is not diaphoretic.  Psychiatric: She has a normal mood and affect. Her behavior is normal.    Results for orders placed in visit on 10/14/13  POCT CBC      Result Value Ref Range   WBC 6.4  4.6 - 10.2 K/uL   Lymph, poc 1.4  0.6 - 3.4   POC LYMPH PERCENT 22.6  10 - 50 %L   MID (cbc) 0.5  0 - 0.9   POC MID % 7.4  0 - 12 %M   POC Granulocyte 4.5  2 - 6.9   Granulocyte percent 70.0  37 - 80 %G   RBC 4.36  4.04 - 5.48 M/uL   Hemoglobin 9.9 (*) 12.2 - 16.2 g/dL   HCT, POC 30.9 (*) 37.7 - 47.9 %   MCV 70.8 (*) 80 - 97 fL   MCH, POC 22.7 (*) 27 - 31.2 pg   MCHC 32.0  31.8 - 35.4 g/dL   RDW, POC  17.3     Platelet Count, POC 662 (*) 142 - 424 K/uL   MPV 7.9  0 - 99.8 fL   [facility] reading (PRIMARY) by  Dr. Tamala Julian.  R SHOULDER:  Diffuse degenerative changes R shoulder; NAD   BP 140/80  Pulse 78  Temp(Src) 98.7 F (37.1 C) (Oral)  Resp 16  Ht 4\' 7"  (1.397 m)  Wt 103 lb 6.4 oz (46.902 kg)  BMI 24.03 kg/m2  SpO2 96% Assessment & Plan:   Right shoulder pain - Plan: DG Shoulder Right, Magnesium  Anemia - Plan: POCT CBC, Iron and TIBC, Magnesium  Hypokalemia - Plan: Comprehensive metabolic panel, Magnesium  Unspecified essential hypertension - Plan: Comprehensive metabolic panel, Magnesium  Osteoarthritis, shoulder  GERD (gastroesophageal reflux disease)  Insomnia  Alcoholism  Dementia  1. R shoulder pain/OA shoulders:  New.  Rx for Meloxicam 15mg  daily provided; stop Naproxen.  Recommend Tylenol 500mg  one po tid for pain. 2.  Anemia: chronic per pt; obtain iron studies; likely secondary to bone marrow suppression from chronic alcoholism; colonoscopy UTD.  Recommend MVI daily.   3.  Hypokalemia: chronic issue for patient; likely due to chronic alcoholism and poor po intake; repeat today; has been taking daily supplement since recent ED visit; obtain Mg as well. 4.  HTN: controlled; obtain labs; refill of Losartan sent to [facility]. 5. GERD: controlled; continue Protonix; refill provided. 6.  Insomnia:  New.  Secondary to cessation of alcohol and OA pain; rx for Remeron 15mg  qhs; also suffering with decreased appetite. 7. Alcoholism: chronic with recent cessation; encourage continued cessation.  Likely cause of various health issues. 8. Dementia: stable. Likely secondary to alcoholism and vascular dementia.  S/p CT head in  ED; at next visit, refer to neurology to follow-up on previous brain surgery, question of brain tumor (per ED note), dementia.  Will warrant MRI; obtain records from [place].  Meds ordered this encounter  Medications  . DISCONTD: pantoprazole (PROTONIX) 40 MG  tablet    Sig: Take 40 mg by mouth daily.  . meloxicam (MOBIC) 15 MG tablet    Sig: Take 1 tablet (15 mg total) by mouth daily.    Dispense:  30 tablet    Refill:  2  . mirtazapine (REMERON) 15 MG tablet    Sig: Take 1 tablet (15 mg total) by mouth at bedtime.    Dispense:  30 tablet    Refill:  3  . losartan (COZAAR) 50 MG tablet    Sig: Take 1 tablet (50 mg total) by mouth daily.    Dispense:  90 tablet    Refill:  3  . pantoprazole (PROTONIX) 40 MG tablet    Sig: Take 1 tablet (40 mg total) by mouth daily.    Dispense:  90 tablet    Refill:  3   I personally performed the services described in this documentation, which was scribed in my presence.  The recorded information has been reviewed and is accurate.  Reginia Forts, M.D.  [facility] [address] [place], [place]  [address] [phone] phone [phone] fax

## 2013-10-18 ENCOUNTER — Telehealth: Payer: Self-pay

## 2013-10-18 MED ORDER — POTASSIUM CHLORIDE CRYS ER 20 MEQ PO TBCR: 20.0000 meq | EXTENDED_RELEASE_TABLET | Freq: Every day | ORAL | Status: DC

## 2013-10-18 NOTE — Telephone Encounter (Signed)
RF req from [facility] for potassium, HCTZ and amlodipine. HCTZ and amlodipine are not on pt's med list at 10/14/13 OV. Called pt's daughter (d/t pt's mild dementia) and discussed medications at length. She reported that the nursing home had pt on some medications, but went through all of pt's current medications and neither amlodipine or HCTZ are current meds. I sent denial of these two and sent in RF of potassium.

## 2014-01-16 ENCOUNTER — Other Ambulatory Visit: Payer: Self-pay | Admitting: Family Medicine

## 2014-04-17 ENCOUNTER — Telehealth: Payer: Self-pay

## 2014-04-17 MED ORDER — PANTOPRAZOLE SODIUM 40 MG PO TBEC: 40.0000 mg | DELAYED_RELEASE_TABLET | Freq: Every day | ORAL | Status: DC

## 2014-04-17 NOTE — Telephone Encounter (Signed)
Sent remaining RFs to [facility] and notified pt.

## 2014-04-17 NOTE — Telephone Encounter (Signed)
Patient needs to transfer her script pantoprazole (PROTONIX) 40 MG tablet  to [facility] on [address]   She is completely out.  Pharmacist instructed her to call her for a script.   [phone]

## 2014-05-15 ENCOUNTER — Emergency Department (HOSPITAL_COMMUNITY): Payer: Medicare Other

## 2014-05-15 ENCOUNTER — Encounter (HOSPITAL_COMMUNITY): Payer: Self-pay | Admitting: Emergency Medicine

## 2014-05-15 ENCOUNTER — Emergency Department (HOSPITAL_COMMUNITY)
Admission: EM | Admit: 2014-05-15 | Discharge: 2014-05-16 | Disposition: A | Discharge status: Home / Self Care | Payer: Medicare Other | Attending: Emergency Medicine | Admitting: Emergency Medicine

## 2014-05-15 DIAGNOSIS — Z79899 Other long term (current) drug therapy: Secondary | ICD-10-CM | POA: Diagnosis not present

## 2014-05-15 DIAGNOSIS — Y998 Other external cause status: Secondary | ICD-10-CM | POA: Diagnosis not present

## 2014-05-15 DIAGNOSIS — K5900 Constipation, unspecified: Secondary | ICD-10-CM | POA: Diagnosis not present

## 2014-05-15 DIAGNOSIS — I10 Essential (primary) hypertension: Secondary | ICD-10-CM | POA: Diagnosis not present

## 2014-05-15 DIAGNOSIS — F0390 Unspecified dementia without behavioral disturbance: Secondary | ICD-10-CM | POA: Diagnosis not present

## 2014-05-15 DIAGNOSIS — Z86011 Personal history of benign neoplasm of the brain: Secondary | ICD-10-CM | POA: Insufficient documentation

## 2014-05-15 DIAGNOSIS — Y92002 Bathroom of unspecified non-institutional (private) residence single-family (private) house as the place of occurrence of the external cause: Secondary | ICD-10-CM | POA: Diagnosis not present

## 2014-05-15 DIAGNOSIS — Z86010 Personal history of colonic polyps: Secondary | ICD-10-CM | POA: Diagnosis not present

## 2014-05-15 DIAGNOSIS — M1990 Unspecified osteoarthritis, unspecified site: Secondary | ICD-10-CM | POA: Diagnosis not present

## 2014-05-15 DIAGNOSIS — S63501A Unspecified sprain of right wrist, initial encounter: Secondary | ICD-10-CM | POA: Insufficient documentation

## 2014-05-15 DIAGNOSIS — W182XXA Fall in (into) shower or empty bathtub, initial encounter: Secondary | ICD-10-CM | POA: Diagnosis not present

## 2014-05-15 DIAGNOSIS — Z791 Long term (current) use of non-steroidal anti-inflammatories (NSAID): Secondary | ICD-10-CM | POA: Insufficient documentation

## 2014-05-15 DIAGNOSIS — T1490XA Injury, unspecified, initial encounter: Secondary | ICD-10-CM

## 2014-05-15 DIAGNOSIS — Y9389 Activity, other specified: Secondary | ICD-10-CM | POA: Diagnosis not present

## 2014-05-15 DIAGNOSIS — S6991XA Unspecified injury of right wrist, hand and finger(s), initial encounter: Secondary | ICD-10-CM | POA: Diagnosis present

## 2014-05-15 NOTE — ED Notes (Signed)
Ice pack provided

## 2014-05-15 NOTE — ED Notes (Signed)
Pt from home c/o right hand pain from falling in the tub in Monday. Right hand is red and swollen.

## 2014-05-16 MED ORDER — TRAMADOL HCL 50 MG PO TABS: 50.0000 mg | ORAL_TABLET | Freq: Four times a day (QID) | ORAL | Status: DC | PRN

## 2014-05-16 MED ORDER — TRAMADOL HCL 50 MG PO TABS
50.0000 mg | ORAL_TABLET | Freq: Once | ORAL | Status: AC
  Administered 2014-05-16: 50 mg via ORAL
  Filled 2014-05-16: qty 1

## 2014-05-16 NOTE — Discharge Instructions (Signed)
Cryotherapy Cryotherapy is when you put ice on your injury. Ice helps lessen pain and puffiness (swelling) after an injury. Ice works the best when you start using it in the first 24 to 48 hours after an injury. HOME CARE  Put a dry or damp towel between the ice pack and your skin.  You may press gently on the ice pack.  Leave the ice on for no more than 10 to 20 minutes at a time.  Check your skin after 5 minutes to make sure your skin is okay.  Rest at least 20 minutes between ice pack uses.  Stop using ice when your skin loses feeling (numbness).  Do not use ice on someone who cannot tell you when it hurts. This includes small children and people with memory problems (dementia). GET HELP RIGHT AWAY IF:  You have white spots on your skin.  Your skin turns blue or pale.  Your skin feels waxy or hard.  Your puffiness gets worse. MAKE SURE YOU:   Understand these instructions.  Will watch your condition.  Will get help right away if you are not doing well or get worse. Document Released: 11/10/2007 Document Revised: 08/16/2011 Document Reviewed: 01/14/2011 [facility] Patient Information 2015 [place], [place]. This information is not intended to replace advice given to you by your health care provider. Make sure you discuss any questions you have with your health care provider. Wrist Pain Wrist injuries are frequent in adults and children. A sprain is an injury to the ligaments that hold your bones together. A strain is an injury to muscle or muscle cord-like structures (tendons) from stretching or pulling. Generally, when wrists are moderately tender to touch following a fall or injury, a break in the bone (fracture) may be present. Most wrist sprains or strains are better in 3 to 5 days, but complete healing may take several weeks. HOME CARE INSTRUCTIONS   Put ice on the injured area.  Put ice in a plastic bag.  Place a towel between your skin and the bag.  Leave the ice on  for 15-20 minutes, 3-4 times a day, for the first 2 days, or as directed by your health care provider.  Keep your arm raised above the level of your heart whenever possible to reduce swelling and pain.  Rest the injured area for at least 48 hours or as directed by your health care provider.  If a splint or elastic bandage has been applied, use it for as long as directed by your health care provider or until seen by a health care provider for a follow-up exam.  Only take over-the-counter or prescription medicines for pain, discomfort, or fever as directed by your health care provider.  Keep all follow-up appointments. You may need to follow up with a specialist or have follow-up X-rays. Improvement in pain level is not a guarantee that you did not fracture a bone in your wrist. The only way to determine whether or not you have a broken bone is by X-ray. SEEK IMMEDIATE MEDICAL CARE IF:   Your fingers are swollen, very red, white, or cold and blue.  Your fingers are numb or tingling.  You have increasing pain.  You have difficulty moving your fingers. MAKE SURE YOU:   Understand these instructions.  Will watch your condition.  Will get help right away if you are not doing well or get worse. Document Released: 03/03/2005 Document Revised: 05/29/2013 Document Reviewed: 07/15/2010 [facility] Patient Information 2015 [place], [place]. This information is  not intended to replace advice given to you by your health care provider. Make sure you discuss any questions you have with your health care provider.

## 2014-05-16 NOTE — ED Provider Notes (Signed)
CSN: [id number]     Arrival date & time 05/15/14  2321 History   First MD Initiated Contact with Patient 05/15/14 2328     Chief Complaint  Patient presents with  . right hand injury      (Consider location/radiation/quality/duration/timing/severity/associated sxs/prior Treatment) Patient is a 80 y.o. female presenting with wrist pain. The history is provided by the patient. No language interpreter was used.  Wrist Pain This is a new problem. The current episode started in the past 7 days. Pertinent negatives include no abdominal pain, chest pain, chills, fever or headaches. Associated symptoms comments: She fell after slipping on soap while in the shower 3 days ago and has continued right wrist pain and swelling. She denies other injury, specifically, no head injury, neck/chest/abdominal pain, hip or lower extremity pain or difficulty ambulating. .    Past Medical History  Diagnosis Date  . Alcohol abuse   . Brain tumor   . Brain aneurysm   . Arthritis   . Hypertension   . Constipation     last colonoscopy 2011  . Colon polyps 06/07/2009  . Dementia    Past Surgical History  Procedure Laterality Date  . Brain surgery    . Abdominal hysterectomy      DUB; removed B ovaries.   Family History  Problem Relation Age of Onset  . Cancer Mother   . Heart disease Father    History  Substance Use Topics  . Smoking status: Never Smoker   . Smokeless tobacco: Current User    Types: Snuff  . Alcohol Use: Yes   OB History    No data available     Review of Systems  Constitutional: Negative for fever and chills.  HENT: Negative.   Respiratory: Negative.   Cardiovascular: Negative.  Negative for chest pain.  Gastrointestinal: Negative.  Negative for abdominal pain.  Genitourinary: Negative.  Negative for dysuria.  Musculoskeletal:       See HPI.  Skin: Negative.  Negative for wound.  Neurological: Negative.  Negative for headaches.      Allergies  Fexofenadine  Home  Medications   Prior to Admission medications   Medication Sig Start Date End Date Taking? Authorizing Provider  acetaminophen (TYLENOL) 500 MG tablet Take 500 mg by mouth daily as needed (pain).    Historical Provider, MD  docusate sodium (COLACE) 100 MG capsule Take 100 mg by mouth daily as needed for mild constipation.    Historical Provider, MD  losartan (COZAAR) 50 MG tablet Take 1 tablet (50 mg total) by mouth daily. 10/14/13   [name], MD  magnesium hydroxide (MILK OF MAGNESIA) 400 MG/5ML suspension Take by mouth daily as needed for mild constipation.    Historical Provider, MD  meloxicam (MOBIC) 15 MG tablet TAKE ONE TABLET BY MOUTH ONCE DAILY. 01/17/14   [name], MD  mirtazapine (REMERON) 15 MG tablet Take 1 tablet (15 mg total) by mouth at bedtime. 10/14/13   [name], MD  pantoprazole (PROTONIX) 40 MG tablet Take 1 tablet (40 mg total) by mouth daily. 04/17/14   [name], MD  potassium chloride SA (K-DUR,KLOR-CON) 20 MEQ tablet Take 1 tablet (20 mEq total) by mouth daily. 10/18/13   [name], MD   BP 162/82 mmHg  Pulse 98  Temp(Src) 99.4 F (37.4 C) (Oral)  Resp 16  SpO2 95% Physical Exam  Constitutional: She is oriented to person, place, and time. She appears well-developed and well-nourished.  Neck:  Normal range of motion.  Cardiovascular: Intact distal pulses.   Pulmonary/Chest: Effort normal.  Musculoskeletal:  Right wrist moderately swollen from dorsal wrist to dorsal hand. No significant bony deformities. Tender to swollen area. FROM all joint. Non-tender forearm and elbow.   Neurological: She is alert and oriented to person, place, and time.  Skin: Skin is warm and dry.    ED Course  Procedures (including critical care time) Labs Review Labs Reviewed - No data to display  Imaging Review Dg Hand Complete Right  05/16/2014   CLINICAL DATA:  RIGHT hand pain, fell in tub on a bar soap 2 days ago, hand redness and swelling, pain at thumb  and wrist  EXAM: RIGHT HAND - COMPLETE 3+ VIEW  COMPARISON:  09/29/2013  FINDINGS: Osseous demineralization.  Scattered narrowing of interphalangeal joints.  Additional narrowing at third and fourth MCP joints.  Angular calcific density is seen at the base of the proximal phalanx of the thumb, seen only on single view, but unchanged since 09/29/2013.  No definite acute fracture, dislocation or bone destruction.  Chondrocalcinosis at the triangular fibrocartilage complex and at dorsum of carpus.  Scattered atherosclerotic calcifications.  IMPRESSION: No definite acute bony abnormalities.  Scattered degenerative changes with question CPPD.   Electronically Signed   By: Lavonia Dana M.D.   On: 05/16/2014 00:15     EKG Interpretation None      MDM   Final diagnoses:  Injury    1. Wrist sprain  Imaging negative for bony injury. Wrist splint provided. Refer to orthopedics if pain persists.     Dewaine Oats, PA-C 05/16/14 0028  Wynetta Fines, MD 05/16/14 [phone]

## 2014-05-20 ENCOUNTER — Telehealth: Payer: Self-pay | Admitting: *Deleted

## 2014-05-20 ENCOUNTER — Telehealth: Payer: Self-pay

## 2014-05-20 NOTE — Telephone Encounter (Signed)
Patients daughter Vaughan Basta is calling to ask on behalf of her mother to get something called in for her mothers possible gout. She has not had any fall injuries that daughter knows about. She suspects it is gout- from research online, patient can not bare any weight on it and it is very swollen. She would like something called from Dr. Tamala Julian- who she had seen last this year. I told patients options of coming in for a office visit because we have never diagnosed for gout or treated leg before- per daughter. Patient says her mothers hand had been swollen before and received something from emergency room and that treated it. He hand is fine but is hoping to get something for her mothers leg. Please advise.  Call Humphrey at: [place] is: Public house manager.

## 2014-05-21 ENCOUNTER — Emergency Department (HOSPITAL_COMMUNITY): Payer: Medicare Other

## 2014-05-21 ENCOUNTER — Inpatient Hospital Stay (HOSPITAL_COMMUNITY)
Admission: EM | Admit: 2014-05-21 | Discharge: 2014-05-27 | DRG: 602 | Disposition: A | Discharge status: Skilled Nursing Facility | Payer: Medicare Other | Attending: Internal Medicine | Admitting: Internal Medicine

## 2014-05-21 ENCOUNTER — Encounter (HOSPITAL_COMMUNITY): Payer: Self-pay | Admitting: Emergency Medicine

## 2014-05-21 DIAGNOSIS — D509 Iron deficiency anemia, unspecified: Secondary | ICD-10-CM | POA: Diagnosis present

## 2014-05-21 DIAGNOSIS — Z79899 Other long term (current) drug therapy: Secondary | ICD-10-CM

## 2014-05-21 DIAGNOSIS — G9340 Encephalopathy, unspecified: Secondary | ICD-10-CM | POA: Diagnosis present

## 2014-05-21 DIAGNOSIS — E876 Hypokalemia: Secondary | ICD-10-CM | POA: Diagnosis present

## 2014-05-21 DIAGNOSIS — W182XXA Fall in (into) shower or empty bathtub, initial encounter: Secondary | ICD-10-CM | POA: Diagnosis present

## 2014-05-21 DIAGNOSIS — M1990 Unspecified osteoarthritis, unspecified site: Secondary | ICD-10-CM | POA: Diagnosis present

## 2014-05-21 DIAGNOSIS — R0602 Shortness of breath: Secondary | ICD-10-CM

## 2014-05-21 DIAGNOSIS — I10 Essential (primary) hypertension: Secondary | ICD-10-CM | POA: Diagnosis present

## 2014-05-21 DIAGNOSIS — E871 Hypo-osmolality and hyponatremia: Secondary | ICD-10-CM | POA: Diagnosis present

## 2014-05-21 DIAGNOSIS — F17220 Nicotine dependence, chewing tobacco, uncomplicated: Secondary | ICD-10-CM | POA: Diagnosis present

## 2014-05-21 DIAGNOSIS — K219 Gastro-esophageal reflux disease without esophagitis: Secondary | ICD-10-CM | POA: Diagnosis present

## 2014-05-21 DIAGNOSIS — L03115 Cellulitis of right lower limb: Principal | ICD-10-CM | POA: Diagnosis present

## 2014-05-21 DIAGNOSIS — R0902 Hypoxemia: Secondary | ICD-10-CM | POA: Diagnosis present

## 2014-05-21 DIAGNOSIS — F0390 Unspecified dementia without behavioral disturbance: Secondary | ICD-10-CM | POA: Diagnosis present

## 2014-05-21 DIAGNOSIS — Z6829 Body mass index (BMI) 29.0-29.9, adult: Secondary | ICD-10-CM | POA: Diagnosis not present

## 2014-05-21 DIAGNOSIS — F1020 Alcohol dependence, uncomplicated: Secondary | ICD-10-CM | POA: Diagnosis present

## 2014-05-21 DIAGNOSIS — N390 Urinary tract infection, site not specified: Secondary | ICD-10-CM | POA: Diagnosis present

## 2014-05-21 DIAGNOSIS — M25569 Pain in unspecified knee: Secondary | ICD-10-CM

## 2014-05-21 DIAGNOSIS — D649 Anemia, unspecified: Secondary | ICD-10-CM | POA: Diagnosis present

## 2014-05-21 DIAGNOSIS — S82141A Displaced bicondylar fracture of right tibia, initial encounter for closed fracture: Secondary | ICD-10-CM | POA: Diagnosis present

## 2014-05-21 DIAGNOSIS — E870 Hyperosmolality and hypernatremia: Secondary | ICD-10-CM | POA: Diagnosis not present

## 2014-05-21 DIAGNOSIS — Y92002 Bathroom of unspecified non-institutional (private) residence single-family (private) house as the place of occurrence of the external cause: Secondary | ICD-10-CM | POA: Diagnosis not present

## 2014-05-21 DIAGNOSIS — S82146A Nondisplaced bicondylar fracture of unspecified tibia, initial encounter for closed fracture: Secondary | ICD-10-CM | POA: Diagnosis present

## 2014-05-21 DIAGNOSIS — M79671 Pain in right foot: Secondary | ICD-10-CM | POA: Diagnosis not present

## 2014-05-21 DIAGNOSIS — E43 Unspecified severe protein-calorie malnutrition: Secondary | ICD-10-CM | POA: Diagnosis present

## 2014-05-21 DIAGNOSIS — R4182 Altered mental status, unspecified: Secondary | ICD-10-CM

## 2014-05-21 DIAGNOSIS — B9620 Unspecified Escherichia coli [E. coli] as the cause of diseases classified elsewhere: Secondary | ICD-10-CM | POA: Diagnosis present

## 2014-05-21 LAB — BASIC METABOLIC PANEL
ANION GAP: 18 — AB (ref 5–15)
BUN: 11 mg/dL (ref 6–23)
CALCIUM: 10.1 mg/dL (ref 8.4–10.5)
CO2: 29 mEq/L (ref 19–32)
CREATININE: 0.66 mg/dL (ref 0.50–1.10)
Chloride: 96 mEq/L (ref 96–112)
GFR calc non Af Amer: 81 mL/min — ABNORMAL LOW (ref >90)
Glucose, Bld: 90 mg/dL (ref 70–99)
Potassium: 2.6 mEq/L — CL (ref 3.7–5.3)
Sodium: 143 mEq/L (ref 137–147)

## 2014-05-21 LAB — IRON AND TIBC
Iron: <10 ug/dL — ABNORMAL LOW (ref 42–135)
UIBC: 265 ug/dL (ref 125–400)

## 2014-05-21 LAB — CBC WITH DIFFERENTIAL/PLATELET
BASOS ABS: 0 10*3/uL (ref 0.0–0.1)
Basophils Relative: 0 % (ref 0–1)
Eosinophils Absolute: 0 10*3/uL (ref 0.0–0.7)
Eosinophils Relative: 0 % (ref 0–5)
HEMATOCRIT: 31.7 % — AB (ref 36.0–46.0)
Hemoglobin: 9.7 g/dL — ABNORMAL LOW (ref 12.0–15.0)
LYMPHS ABS: 1.1 10*3/uL (ref 0.7–4.0)
Lymphocytes Relative: 10 % — ABNORMAL LOW (ref 12–46)
MCH: 21.8 pg — ABNORMAL LOW (ref 26.0–34.0)
MCHC: 30.6 g/dL (ref 30.0–36.0)
MCV: 71.2 fL — AB (ref 78.0–100.0)
Monocytes Absolute: 2.1 10*3/uL — ABNORMAL HIGH (ref 0.1–1.0)
Monocytes Relative: 19 % — ABNORMAL HIGH (ref 3–12)
NEUTROS ABS: 7.9 10*3/uL — AB (ref 1.7–7.7)
Neutrophils Relative %: 71 % (ref 43–77)
Platelets: 621 10*3/uL — ABNORMAL HIGH (ref 150–400)
RBC: 4.45 MIL/uL (ref 3.87–5.11)
RDW: 16.7 % — ABNORMAL HIGH (ref 11.5–15.5)
WBC: 11.1 10*3/uL — AB (ref 4.0–10.5)

## 2014-05-21 LAB — MAGNESIUM: MAGNESIUM: 1.5 mg/dL (ref 1.5–2.5)

## 2014-05-21 LAB — TSH: TSH: 1.3 u[IU]/mL (ref 0.350–4.500)

## 2014-05-21 LAB — VITAMIN B12: Vitamin B-12: 527 pg/mL (ref 211–911)

## 2014-05-21 MED ORDER — POTASSIUM CHLORIDE CRYS ER 20 MEQ PO TBCR
40.0000 meq | EXTENDED_RELEASE_TABLET | Freq: Once | ORAL | Status: DC
  Filled 2014-05-21: qty 2

## 2014-05-21 MED ORDER — ACETAMINOPHEN 650 MG RE SUPP: 650.0000 mg | Freq: Four times a day (QID) | RECTAL | Status: DC | PRN

## 2014-05-21 MED ORDER — PANTOPRAZOLE SODIUM 40 MG PO TBEC
40.0000 mg | DELAYED_RELEASE_TABLET | Freq: Every day | ORAL | Status: DC
  Administered 2014-05-23 – 2014-05-27 (×5): 40 mg via ORAL
  Filled 2014-05-21 (×6): qty 1

## 2014-05-21 MED ORDER — VITAMINS A & D EX OINT
TOPICAL_OINTMENT | CUTANEOUS | Status: AC
  Administered 2014-05-21: 1
  Filled 2014-05-21: qty 5

## 2014-05-21 MED ORDER — MAGNESIUM HYDROXIDE 400 MG/5ML PO SUSP: 15.0000 mL | Freq: Every day | ORAL | Status: DC | PRN

## 2014-05-21 MED ORDER — THIAMINE HCL 100 MG/ML IJ SOLN
100.0000 mg | Freq: Every day | INTRAMUSCULAR | Status: DC
  Filled 2014-05-21 (×2): qty 1

## 2014-05-21 MED ORDER — CLINDAMYCIN PHOSPHATE 600 MG/50ML IV SOLN
600.0000 mg | Freq: Once | INTRAVENOUS | Status: AC
  Administered 2014-05-21: 600 mg via INTRAVENOUS
  Filled 2014-05-21: qty 50

## 2014-05-21 MED ORDER — MIRTAZAPINE 15 MG PO TABS
15.0000 mg | ORAL_TABLET | Freq: Every day | ORAL | Status: DC
  Administered 2014-05-21 – 2014-05-26 (×5): 15 mg via ORAL
  Filled 2014-05-21 (×8): qty 1

## 2014-05-21 MED ORDER — FOLIC ACID 1 MG PO TABS
1.0000 mg | ORAL_TABLET | Freq: Every day | ORAL | Status: DC
  Administered 2014-05-21 – 2014-05-27 (×6): 1 mg via ORAL
  Filled 2014-05-21 (×7): qty 1

## 2014-05-21 MED ORDER — ACETAMINOPHEN 325 MG PO TABS: 650.0000 mg | ORAL_TABLET | Freq: Four times a day (QID) | ORAL | Status: DC | PRN

## 2014-05-21 MED ORDER — HYDRALAZINE HCL 20 MG/ML IJ SOLN
10.0000 mg | Freq: Four times a day (QID) | INTRAMUSCULAR | Status: DC | PRN
  Administered 2014-05-22: 10 mg via INTRAVENOUS
  Filled 2014-05-21: qty 1

## 2014-05-21 MED ORDER — HYDROCODONE-ACETAMINOPHEN 5-325 MG PO TABS: 1.0000 | ORAL_TABLET | ORAL | Status: DC | PRN

## 2014-05-21 MED ORDER — POTASSIUM CHLORIDE CRYS ER 20 MEQ PO TBCR
40.0000 meq | EXTENDED_RELEASE_TABLET | Freq: Once | ORAL | Status: AC
  Administered 2014-05-21: 40 meq via ORAL
  Filled 2014-05-21: qty 2

## 2014-05-21 MED ORDER — ACETAMINOPHEN 325 MG PO TABS
650.0000 mg | ORAL_TABLET | Freq: Four times a day (QID) | ORAL | Status: DC | PRN
  Administered 2014-05-21: 650 mg via ORAL
  Filled 2014-05-21: qty 2

## 2014-05-21 MED ORDER — ENOXAPARIN SODIUM 40 MG/0.4ML ~~LOC~~ SOLN
40.0000 mg | SUBCUTANEOUS | Status: DC
  Administered 2014-05-21 – 2014-05-26 (×6): 40 mg via SUBCUTANEOUS
  Filled 2014-05-21 (×7): qty 0.4

## 2014-05-21 MED ORDER — MORPHINE SULFATE 2 MG/ML IJ SOLN
2.0000 mg | INTRAMUSCULAR | Status: DC | PRN
  Administered 2014-05-21 – 2014-05-26 (×8): 2 mg via INTRAVENOUS
  Filled 2014-05-21 (×8): qty 1

## 2014-05-21 MED ORDER — VITAMIN B-1 100 MG PO TABS
100.0000 mg | ORAL_TABLET | Freq: Every day | ORAL | Status: DC
  Administered 2014-05-21: 100 mg via ORAL
  Filled 2014-05-21 (×3): qty 1

## 2014-05-21 MED ORDER — SODIUM CHLORIDE 0.9 % IV SOLN
INTRAVENOUS | Status: DC
  Administered 2014-05-21 – 2014-05-23 (×4): via INTRAVENOUS
  Filled 2014-05-21 (×5): qty 1000

## 2014-05-21 MED ORDER — SENNA 8.6 MG PO TABS
1.0000 | ORAL_TABLET | Freq: Two times a day (BID) | ORAL | Status: DC
Start: 2014-05-21 — End: 2014-05-27
  Administered 2014-05-21 – 2014-05-27 (×6): 8.6 mg via ORAL
  Filled 2014-05-21 (×6): qty 1

## 2014-05-21 MED ORDER — LORAZEPAM 1 MG PO TABS: 1.0000 mg | ORAL_TABLET | Freq: Four times a day (QID) | ORAL | Status: DC | PRN

## 2014-05-21 MED ORDER — MORPHINE SULFATE 2 MG/ML IJ SOLN: 2.0000 mg | INTRAMUSCULAR | Status: DC | PRN

## 2014-05-21 MED ORDER — LOSARTAN POTASSIUM 50 MG PO TABS
50.0000 mg | ORAL_TABLET | Freq: Every day | ORAL | Status: DC
  Administered 2014-05-21 – 2014-05-27 (×7): 50 mg via ORAL
  Filled 2014-05-21 (×7): qty 1

## 2014-05-21 MED ORDER — POTASSIUM CHLORIDE 10 MEQ/100ML IV SOLN
10.0000 meq | INTRAVENOUS | Status: AC
  Administered 2014-05-21: 10 meq via INTRAVENOUS
  Filled 2014-05-21: qty 100

## 2014-05-21 MED ORDER — SODIUM CHLORIDE 0.9 % IV SOLN
INTRAVENOUS | Status: DC
  Filled 2014-05-21: qty 1000

## 2014-05-21 MED ORDER — POTASSIUM CHLORIDE CRYS ER 20 MEQ PO TBCR
40.0000 meq | EXTENDED_RELEASE_TABLET | Freq: Every day | ORAL | Status: DC
  Administered 2014-05-23 – 2014-05-27 (×5): 40 meq via ORAL
  Filled 2014-05-21 (×6): qty 2

## 2014-05-21 MED ORDER — CLINDAMYCIN PHOSPHATE 600 MG/50ML IV SOLN
600.0000 mg | Freq: Three times a day (TID) | INTRAVENOUS | Status: DC
  Administered 2014-05-21 – 2014-05-22 (×3): 600 mg via INTRAVENOUS
  Filled 2014-05-21 (×5): qty 50

## 2014-05-21 MED ORDER — CLINDAMYCIN PHOSPHATE 600 MG/50ML IV SOLN
600.0000 mg | Freq: Three times a day (TID) | INTRAVENOUS | Status: DC
  Filled 2014-05-21: qty 50

## 2014-05-21 MED ORDER — LORAZEPAM 2 MG/ML IJ SOLN
1.0000 mg | Freq: Four times a day (QID) | INTRAMUSCULAR | Status: DC | PRN
  Administered 2014-05-22 (×2): 1 mg via INTRAVENOUS
  Filled 2014-05-21 (×2): qty 1

## 2014-05-21 MED ORDER — ADULT MULTIVITAMIN W/MINERALS CH
1.0000 | ORAL_TABLET | Freq: Every day | ORAL | Status: DC
  Administered 2014-05-21 – 2014-05-27 (×6): 1 via ORAL
  Filled 2014-05-21 (×7): qty 1

## 2014-05-21 NOTE — Telephone Encounter (Signed)
Daughter Vaughan Basta called - upset she did not get a call back last night.  She will have to take her mother by ambulance to ER if she cannot get gout medicine ASAP.  Gordon

## 2014-05-21 NOTE — Telephone Encounter (Signed)
Noted and agree; needs evaluation somewhere.  Cannot treat over the phone.

## 2014-05-21 NOTE — ED Notes (Signed)
Pt's contact---- Vaughan Basta (daughter): tel# [phone]

## 2014-05-21 NOTE — ED Notes (Signed)
Bed: HO88 Expected date:  Expected time:  Means of arrival:  Comments: EMS- foot pain

## 2014-05-21 NOTE — Telephone Encounter (Signed)
Pt is unable to weight bear or ambulate. Calf is swollen and red. It is warm to touch.  Advised daughter to bring pt in. She is unable to transport pt. Advised to call 911 and bring her to ED. Laura May states she is going to call now.

## 2014-05-21 NOTE — Progress Notes (Signed)
  CARE MANAGEMENT ED NOTE 05/21/2014  Patient:  Laura May, Laura May   Account Number:  [id number]  Date Initiated:  05/21/2014  Documentation initiated by:  Livia Snellen  Subjective/Objective Assessment:   Patient presents to Ed with right foot pain post fall at home.     Subjective/Objective Assessment Detail:   Patient with pmhx alcohol abuse, brain tumor, HTN, dementia.  Xray right knee: Evidence of a fracture of the lateral aspect of the medial tibial  plateau. Moderate joint effusion. Multilevel osteoarthritic change.  Meniscal calcification may be seen with osteoarthritis but also may  be indicative of calcium pyrophosphate deposition disease which may  present clinically as pseudogout. There is atherosclerotic  calcification present.     Action/Plan:   Orthopedic consult,  IV antibiotics   Action/Plan Detail:   Anticipated DC Date:       Status Recommendation to Physician:   Result of Recommendation:    Other ED Services  Consult [facility]  CM consult  Other    Choice offered to / List presented to:  C-4 Adult Children          Status of service:  Completed, signed off  ED Comments:   ED Comments Detail:  EDCM spoke to patient and her daughter Vaughan Basta at bedside. Patient lives with her daughter Vaughan Basta.  Patient's daughter reports patient has a walker, bedside commode elevated toilet seat, and shower chair at home.  Patient's daughter reports the patient is usually very independent at home up until now.  Patient does not have home health services currently and never had home health in the past per patient's daughter.  Patient's daughter reports she is interested in finding someone who will have give the patient a bath at home.  Patient with [facility] Medicare and Medicaid insurnace.  EDCM explained to patient's daughter that [facility] sometimes does not pay for a nursing aide.  EDCM provided patient's daughter with list of private duty nursing agencies and list  of home health agencies in [place].  Patient's daughter thankful for resources.  No further EDCM needs at this time.

## 2014-05-21 NOTE — ED Notes (Signed)
Pt states Wednesday of last week, slipped and fell in shower had wrist checked and denied any other pain. starting Saturday starting having swelling to right foot and couldn't walk on it Sunday, woke up this morning to a swollen right foot. swollen and warm to touch but able to move.

## 2014-05-21 NOTE — H&P (Addendum)
[facility] History and Physical  Laura May IOE:[id number] DOB: [birth year] DOA: 05/21/2014  Referring physician: Dr Tamera Punt  PCP: [facility]  Chief Complaint:  Fall with pain over right knee and leg  HPI:  80 year old female with history of hypertension, GERD, active alcohol use brought to the hospital with severe pain in her right knee and foot. Patient had a fall in her shower about a week back and landed on her right knee and her wrist at that time. She came to the ED due to pain and tenderness in her wrist and had an x-ray which was negative for fracture. Patient was discharged home with some pain medications and felt fine however for the past few days she has noticed increase redness and swelling in her right foot along with swelling in her right knee being unable to be at any weight. She denies any pain in her hip. She reports drinking about a pint of liquor every day and her last drink was 4 days back. She denies any shakes or tremors. She denies passing out and she had the fall.  Patient denies headache, dizziness, fever, chills, nausea , vomiting, chest pain, palpitations, SOB, abdominal pain, bowel or urinary symptoms. Denies change in weight or appetite.  Course in the ED  Patient on exam was found to have cellulitis of the right foot. Her blood pressure was mildly elevated. Blood work showed WC 11.1. Hemoglobin of 9.7, hematocrit of 31.7, platelets of 621, potassium of 2.6, normal renal function. EKG showed PACs with borderline QTC prolongation. X-ray of the right foot was negative for any abnormality. An x-ray of the right knee showed fracture of the lateral aspect of the medial tibial plateau with moderate joint effusion. Also showed multilevel osteoarthritic changes.  Patient given IV clindamycin and Hospitalist admission requested. Orthopedic surgery consulted by ED physician. (Dr. Maureen Ralphs will see the patient.)   Review of Systems:  Constitutional: Denies  fever, chills, diaphoresis, appetite change and fatigue.  HEENT: Denies, eye pain,  ear pain, congestion, sore throat, r trouble swallowing, neck pain,  Respiratory: Denies SOB, DOE, cough, chest tightness,  and wheezing.   Cardiovascular: Denies chest pain, palpitations and leg swelling.  Gastrointestinal: Denies nausea, vomiting, abdominal pain, diarrhea,  blood in stool  Genitourinary: Denies dysuria,  hematuria, flank pain and difficulty urinating.  Endocrine: Denies polyuria, polydipsia. Musculoskeletal: Pain and swelling in the right knee and ankle with redness. Unable to be in weight on the right leg. Denies myalgias, back pain, Skin: Denies  rash and wound.  Neurological: Denies dizziness,  syncope, weakness, light-headedness, numbness and headaches.  Psychiatric/Behavioral: Denies confusion. Past Medical History  Diagnosis Date  . Alcohol abuse   . Brain tumor   . Brain aneurysm   . Arthritis   . Hypertension   . Constipation     last colonoscopy 2011  . Colon polyps 06/07/2009  . Dementia    Past Surgical History  Procedure Laterality Date  . Brain surgery    . Abdominal hysterectomy      DUB; removed B ovaries.   Social History:  reports that she has never smoked. Her smokeless tobacco use includes Snuff. She reports that she drinks alcohol. She reports that she does not use illicit drugs.  Allergies  Allergen Reactions  . Fexofenadine Other (See Comments)    Unknown reaction    Family History  Problem Relation Age of Onset  . Cancer Mother   . Heart disease Father     Prior  to Admission medications   Medication Sig Start Date End Date Taking? Authorizing Provider  acetaminophen (TYLENOL) 500 MG tablet Take 500 mg by mouth daily as needed for moderate pain.    Yes Historical Provider, MD  losartan (COZAAR) 50 MG tablet Take 1 tablet (50 mg total) by mouth daily. 10/14/13  Yes [name], MD  pantoprazole (PROTONIX) 40 MG tablet Take 1 tablet (40 mg total) by  mouth daily. 04/17/14  Yes [name], MD  traMADol (ULTRAM) 50 MG tablet Take 1 tablet (50 mg total) by mouth every 6 (six) hours as needed. Patient taking differently: Take 50 mg by mouth every 6 (six) hours as needed for moderate pain.  05/16/14  Yes Shari A Upstill, PA-C  docusate sodium (COLACE) 100 MG capsule Take 100 mg by mouth daily as needed for mild constipation.    Historical Provider, MD  magnesium hydroxide (MILK OF MAGNESIA) 400 MG/5ML suspension Take by mouth daily as needed for mild constipation.    Historical Provider, MD  meloxicam (MOBIC) 15 MG tablet TAKE ONE TABLET BY MOUTH ONCE DAILY. Patient not taking: Reported on 05/21/2014 01/17/14   [name], MD  mirtazapine (REMERON) 15 MG tablet Take 1 tablet (15 mg total) by mouth at bedtime. Patient not taking: Reported on 05/21/2014 10/14/13   [name], MD  potassium chloride SA (K-DUR,KLOR-CON) 20 MEQ tablet Take 1 tablet (20 mEq total) by mouth daily. Patient not taking: Reported on 05/21/2014 10/18/13   [name], MD     Physical Exam:  Filed Vitals:   05/21/14 1230 05/21/14 1513 05/21/14 1600  BP: 180/63 163/72 174/81  Pulse: 94 93 97  Temp: 99.2 F (37.3 C)    TempSrc: Oral    Resp: 16 18   SpO2: 97% 95% 97%    Constitutional: Vital signs reviewed.  Elderly female lying in bed in no acute distress HEENT: no pallor, no icterus, moist oral mucosa, no cervical lymphadenopathy Cardiovascular: S1 and S2 irregular, no murmurs rub or gallop Chest: CTAB, no wheezes, rales, or rhonchi Abdominal: Soft. Non-tender, non-distended, bowel sounds are normal,  Ext: Swelling with erythema and tenderness over right ankle, marked swelling with tenderness over right knee , markedly limited flexion due to pain.  Neurological: Alert and oriented  Labs on Admission:  Basic Metabolic Panel:  Recent Labs Lab 05/21/14 1358  NA 143  K 2.6*  CL 96  CO2 29  GLUCOSE 90  BUN 11  CREATININE 0.66  CALCIUM 10.1    Liver Function Tests: No results for input(s): AST, ALT, ALKPHOS, BILITOT, PROT, ALBUMIN in the last 168 hours. No results for input(s): LIPASE, AMYLASE in the last 168 hours. No results for input(s): AMMONIA in the last 168 hours. CBC:  Recent Labs Lab 05/21/14 1358  WBC 11.1*  NEUTROABS 7.9*  HGB 9.7*  HCT 31.7*  MCV 71.2*  PLT 621*   Cardiac Enzymes: No results for input(s): CKTOTAL, CKMB, CKMBINDEX, TROPONINI in the last 168 hours. BNP: Invalid input(s): POCBNP CBG: No results for input(s): GLUCAP in the last 168 hours.  Radiological Exams on Admission: Dg Knee Complete 4 Views Right  05/21/2014   CLINICAL DATA:  Fall 2 days prior in shower.  Pain and swelling  EXAM: RIGHT KNEE - COMPLETE 4+ VIEW  COMPARISON:  None.  FINDINGS: Frontal, lateral, and bilateral oblique views were obtained. There is a moderate joint effusion. There is an apparent fracture along the lateral aspect of the medial tibial plateau,  apparent only on the frontal view. This fracture does not appear to be significantly displaced on current study. No other fracture apparent. No dislocation. There is moderate narrowing medially and in the patellofemoral joint region. There is spurring medially and in the patellofemoral joint region. There are foci of meniscal calcification. Calcification is noted in the medial aspect of the joint which probably represents residua of old trauma. There are foci of arterial vascular calcification.  IMPRESSION: Evidence of a fracture of the lateral aspect of the medial tibial plateau. Moderate joint effusion. Multilevel osteoarthritic change. Meniscal calcification may be seen with osteoarthritis but also may be indicative of calcium pyrophosphate deposition disease which may present clinically as pseudogout. There is atherosclerotic calcification present.   Electronically Signed   By: Lowella Grip M.D.   On: 05/21/2014 14:48   Dg Foot Complete Right  05/21/2014   CLINICAL  DATA:  Status post fall 1 week ago with right foot pain. Initial encounter. Difficulty walking.  EXAM: RIGHT FOOT COMPLETE - 3+ VIEW  COMPARISON:  None.  FINDINGS: No acute bony or joint abnormality is identified. Notable degenerative change is seen. First MTP osteoarthritis is noted. Vascular calcifications are identified.  IMPRESSION: No acute abnormality or finding to explain the patient's symptoms.   Electronically Signed   By: Inge Rise M.D.   On: 05/21/2014 13:38    EKG: Normal sinus rhythm with PACs, prolonged QTC of 494  Assessment/Plan  Principal Problem:   Cellulitis of right foot Admit to medical floor. Empiric IV clindamycin. Monitor WBC and temperature curve. -Pain control with when necessary Tylenol and Vicodin. Keep leg elevated.  Active Problems:   Hypokalemia Replenish with by mouth and IV KCl. Check magnesium level  Recheck labs in am     Fracture of right tibial plateau Patient has swelling of the right knee with moderate effusion. Pain control with when necessary Vicodin and morphine. Orthopedics (Dr Maureen Ralphs) consulted ED physician. -PT eval. -Patient does not have any cardiac history, and if undergoing surgery for her tibial fracture will be intermediate risk procedure. Her EKG is unremarkable. She does not need further cardiac workup for preoperative clearance. Also does not need perioperative beta blocker.    Anemia Possibly iron deficiency. Check iron panel, TSH and B12.    Essential hypertension Resume home blood pressure medication    GERD (gastroesophageal reflux disease) Continue PPI    Alcoholism Drinks one pint liquor daily. . Last drink was 4 days back. No sign of withdrawal. Monitor on CIWA. Added thiamine, folate and multivitamin. Nutrition consult.    Diet:cardiac  DVT prophylaxis: sq lovenox   Code Status: Full code Family Communication: discussed with daughter at bedside Disposition Plan: Inpatient  Louellen Molder [facility] Pager [phone]  Total time spent on admission :70 minutes  If 7PM-7AM, please contact night-coverage [email or web address] Password [email or web address] 05/21/2014, 4:40 PM

## 2014-05-21 NOTE — ED Provider Notes (Signed)
CSN: [id number]     Arrival date & time 05/21/14  1230 History   First MD Initiated Contact with Patient 05/21/14 1314     Chief Complaint  Patient presents with  . Foot Pain     (Consider location/radiation/quality/duration/timing/severity/associated sxs/prior Treatment) HPI Comments: Patient with a history of alcohol abuse and hypertension presents with right foot pain. She had a slip and fall in a shower about a week ago and had tenderness and her wrist at that time. There is no evidence of fracture. Her wrist is done better although over the last 2 days she's had increasing redness and swelling in her right foot. She's also been complaining of pain in her right knee. She's been unable to bear weight on the right leg. She denies any pain in the hip. She states she hasn't had anything to drink in the last 4 days. She normally drinks a pint a day. She denies any tremors. She denies any wounds or other injuries to the foot.  Patient is a 80 y.o. female presenting with lower extremity pain.  Foot Pain Pertinent negatives include no chest pain, no abdominal pain, no headaches and no shortness of breath.    Past Medical History  Diagnosis Date  . Alcohol abuse   . Brain tumor   . Brain aneurysm   . Arthritis   . Hypertension   . Constipation     last colonoscopy 2011  . Colon polyps 06/07/2009  . Dementia    Past Surgical History  Procedure Laterality Date  . Brain surgery    . Abdominal hysterectomy      DUB; removed B ovaries.   Family History  Problem Relation Age of Onset  . Cancer Mother   . Heart disease Father    History  Substance Use Topics  . Smoking status: Never Smoker   . Smokeless tobacco: Current User    Types: Snuff  . Alcohol Use: Yes     Comment: Pint of bourbon per day - none for 4 days - 05/21/14   OB History    No data available     Review of Systems  Constitutional: Negative for fever, chills, diaphoresis and fatigue.  HENT: Negative for  congestion, rhinorrhea and sneezing.   Eyes: Negative.   Respiratory: Negative for cough, chest tightness and shortness of breath.   Cardiovascular: Negative for chest pain and leg swelling.  Gastrointestinal: Negative for nausea, vomiting, abdominal pain, diarrhea and blood in stool.  Genitourinary: Negative for frequency, hematuria, flank pain and difficulty urinating.  Musculoskeletal: Positive for arthralgias. Negative for back pain.  Skin: Negative for rash.  Neurological: Negative for dizziness, speech difficulty, weakness, numbness and headaches.      Allergies  Fexofenadine  Home Medications   Prior to Admission medications   Medication Sig Start Date End Date Taking? Authorizing Provider  acetaminophen (TYLENOL) 500 MG tablet Take 500 mg by mouth daily as needed for moderate pain.    Yes Historical Provider, MD  losartan (COZAAR) 50 MG tablet Take 1 tablet (50 mg total) by mouth daily. 10/14/13  Yes [name], MD  pantoprazole (PROTONIX) 40 MG tablet Take 1 tablet (40 mg total) by mouth daily. 04/17/14  Yes [name], MD  traMADol (ULTRAM) 50 MG tablet Take 1 tablet (50 mg total) by mouth every 6 (six) hours as needed. Patient taking differently: Take 50 mg by mouth every 6 (six) hours as needed for moderate pain.  05/16/14  Yes Dewaine Oats,  PA-C  docusate sodium (COLACE) 100 MG capsule Take 100 mg by mouth daily as needed for mild constipation.    Historical Provider, MD  magnesium hydroxide (MILK OF MAGNESIA) 400 MG/5ML suspension Take by mouth daily as needed for mild constipation.    Historical Provider, MD  meloxicam (MOBIC) 15 MG tablet TAKE ONE TABLET BY MOUTH ONCE DAILY. Patient not taking: Reported on 05/21/2014 01/17/14   [name], MD  mirtazapine (REMERON) 15 MG tablet Take 1 tablet (15 mg total) by mouth at bedtime. Patient not taking: Reported on 05/21/2014 10/14/13   [name], MD  potassium chloride SA (K-DUR,KLOR-CON) 20 MEQ tablet Take 1  tablet (20 mEq total) by mouth daily. Patient not taking: Reported on 05/21/2014 10/18/13   [name], MD   BP 163/72 mmHg  Pulse 93  Temp(Src) 99.2 F (37.3 C) (Oral)  Resp 18  SpO2 95% Physical Exam  Constitutional: She is oriented to person, place, and time. She appears well-developed and well-nourished.  HENT:  Head: Normocephalic and atraumatic.  Eyes: Pupils are equal, round, and reactive to light.  Neck: Normal range of motion. Neck supple.  Cardiovascular: Normal rate, regular rhythm and normal heart sounds.   Pulmonary/Chest: Effort normal and breath sounds normal. No respiratory distress. She has no wheezes. She has no rales. She exhibits no tenderness.  Abdominal: Soft. Bowel sounds are normal. There is no tenderness. There is no rebound and no guarding.  Musculoskeletal: Normal range of motion. She exhibits no edema.  Patient has redness and swelling diffusely to the right foot. There is diffuse pain on palpation the foot. There is no significant pain to the ankle or lower leg. There is diffuse pain and swelling to the right knee but no signs of overlying cellulitis in this area as noted. There is no wounds around the foot. There is no pain on palpation or range of motion of the right hip. Pedal pulses are intact.  Lymphadenopathy:    She has no cervical adenopathy.  Neurological: She is alert and oriented to person, place, and time.  Skin: Skin is warm and dry. No rash noted.  Psychiatric: She has a normal mood and affect.    ED Course  Procedures (including critical care time) Labs Review Labs Reviewed  CBC WITH DIFFERENTIAL - Abnormal; Notable for the following:    WBC 11.1 (*)    Hemoglobin 9.7 (*)    HCT 31.7 (*)    MCV 71.2 (*)    MCH 21.8 (*)    RDW 16.7 (*)    Platelets 621 (*)    Lymphocytes Relative 10 (*)    Monocytes Relative 19 (*)    Neutro Abs 7.9 (*)    Monocytes Absolute 2.1 (*)    All other components within normal limits  BASIC METABOLIC  PANEL - Abnormal; Notable for the following:    Potassium 2.6 (*)    GFR calc non Af Amer 81 (*)    Anion gap 18 (*)    All other components within normal limits    Imaging Review Dg Knee Complete 4 Views Right  05/21/2014   CLINICAL DATA:  Fall 2 days prior in shower.  Pain and swelling  EXAM: RIGHT KNEE - COMPLETE 4+ VIEW  COMPARISON:  None.  FINDINGS: Frontal, lateral, and bilateral oblique views were obtained. There is a moderate joint effusion. There is an apparent fracture along the lateral aspect of the medial tibial plateau, apparent only on the frontal view.  This fracture does not appear to be significantly displaced on current study. No other fracture apparent. No dislocation. There is moderate narrowing medially and in the patellofemoral joint region. There is spurring medially and in the patellofemoral joint region. There are foci of meniscal calcification. Calcification is noted in the medial aspect of the joint which probably represents residua of old trauma. There are foci of arterial vascular calcification.  IMPRESSION: Evidence of a fracture of the lateral aspect of the medial tibial plateau. Moderate joint effusion. Multilevel osteoarthritic change. Meniscal calcification may be seen with osteoarthritis but also may be indicative of calcium pyrophosphate deposition disease which may present clinically as pseudogout. There is atherosclerotic calcification present.   Electronically Signed   By: Lowella Grip M.D.   On: 05/21/2014 14:48   Dg Foot Complete Right  05/21/2014   CLINICAL DATA:  Status post fall 1 week ago with right foot pain. Initial encounter. Difficulty walking.  EXAM: RIGHT FOOT COMPLETE - 3+ VIEW  COMPARISON:  None.  FINDINGS: No acute bony or joint abnormality is identified. Notable degenerative change is seen. First MTP osteoarthritis is noted. Vascular calcifications are identified.  IMPRESSION: No acute abnormality or finding to explain the patient's symptoms.    Electronically Signed   By: Inge Rise M.D.   On: 05/21/2014 13:38     EKG Interpretation None      MDM   Final diagnoses:  Knee pain    Patient has evident cellulitis of the right foot. I don't see any evidence of underlying bony injury. I spoke with the hospitalist who will admit the patient. She has a markedly low potassium and was given potassium replacement in the ED. Her anemia appears at baseline. She does have evidence of a tibial plateau fracture and I will consult with orthopedics regarding this. It appears to be nondisplaced  15:43  Will place pt in knee immobilizer.  Dr. Maureen Ralphs to see pt.  Malvin Johns, MD 05/21/14 [phone]

## 2014-05-21 NOTE — ED Notes (Signed)
Patient transported to X-ray 

## 2014-05-21 NOTE — Consult Note (Signed)
Reason for Consult:Right tibial plateau fracture Referring Physician: Dr. [name] is an 80 y.o. female.  HPI: Laura May is an 80 yo female who had a fall at home approximately 2 days ago landing on her left knee with immediate pain and swelling but with ability to bear weight. Pain has persisted and today had significant right foot swelling and inability to bear weight . Taken to ED for evaluation and admitted to medical service for cellulitis foot and also discovered to have a non-displaced tibial plateau fracture.  Past Medical History  Diagnosis Date  . Alcohol abuse   . Brain tumor   . Brain aneurysm   . Arthritis   . Hypertension   . Constipation     last colonoscopy 2011  . Colon polyps 06/07/2009  . Dementia     Past Surgical History  Procedure Laterality Date  . Brain surgery    . Abdominal hysterectomy      DUB; removed B ovaries.    Family History  Problem Relation Age of Onset  . Cancer Mother   . Heart disease Father     Social History:  reports that she has never smoked. Her smokeless tobacco use includes Snuff. She reports that she drinks alcohol. She reports that she does not use illicit drugs.  Allergies:  Allergies  Allergen Reactions  . Fexofenadine Other (See Comments)    Unknown reaction    Medications: I have reviewed the patient's current medications.  Results for orders placed or performed during the hospital encounter of 05/21/14 (from the past 48 hour(s))  CBC with Differential     Status: Abnormal   Collection Time: 05/21/14  1:58 PM  Result Value Ref Range   WBC 11.1 (H) 4.0 - 10.5 K/uL   RBC 4.45 3.87 - 5.11 MIL/uL   Hemoglobin 9.7 (L) 12.0 - 15.0 g/dL   HCT 31.7 (L) 36.0 - 46.0 %   MCV 71.2 (L) 78.0 - 100.0 fL   MCH 21.8 (L) 26.0 - 34.0 pg   MCHC 30.6 30.0 - 36.0 g/dL   RDW 16.7 (H) 11.5 - 15.5 %   Platelets 621 (H) 150 - 400 K/uL   Neutrophils Relative % 71 43 - 77 %   Lymphocytes Relative 10 (L) 12 - 46 %   Monocytes Relative 19 (H) 3 - 12 %   Eosinophils Relative 0 0 - 5 %   Basophils Relative 0 0 - 1 %   Neutro Abs 7.9 (H) 1.7 - 7.7 K/uL   Lymphs Abs 1.1 0.7 - 4.0 K/uL   Monocytes Absolute 2.1 (H) 0.1 - 1.0 K/uL   Eosinophils Absolute 0.0 0.0 - 0.7 K/uL   Basophils Absolute 0.0 0.0 - 0.1 K/uL   Smear Review MORPHOLOGY UNREMARKABLE   Basic metabolic panel     Status: Abnormal   Collection Time: 05/21/14  1:58 PM  Result Value Ref Range   Sodium 143 137 - 147 mEq/L   Potassium 2.6 (LL) 3.7 - 5.3 mEq/L    Comment: CRITICAL RESULT CALLED TO, READ BACK BY AND VERIFIED WITHVennie Homans RN [id number] 05/21/14 A NAVARRO    Chloride 96 96 - 112 mEq/L   CO2 29 19 - 32 mEq/L   Glucose, Bld 90 70 - 99 mg/dL   BUN 11 6 - 23 mg/dL   Creatinine, Ser 0.66 0.50 - 1.10 mg/dL   Calcium 10.1 8.4 - 10.5 mg/dL   GFR calc non Af Amer 81 (L) >90 mL/min  GFR calc Af Amer >90 >90 mL/min    Comment: (NOTE) The eGFR has been calculated using the CKD EPI equation. This calculation has not been validated in all clinical situations. eGFR's persistently <90 mL/min signify possible Chronic Kidney Disease.    Anion gap 18 (H) 5 - 15  Magnesium     Status: None   Collection Time: 05/21/14  5:33 PM  Result Value Ref Range   Magnesium 1.5 1.5 - 2.5 mg/dL  Iron and TIBC     Status: Abnormal   Collection Time: 05/21/14  5:44 PM  Result Value Ref Range   Iron <10 (L) 42 - 135 ug/dL   TIBC Not calculated due to Iron <10. 250 - 470 ug/dL   Saturation Ratios Not calculated due to Iron <10. 20 - 55 %   UIBC 265 125 - 400 ug/dL    Comment: Performed at [facility]  TSH     Status: None   Collection Time: 05/21/14  5:44 PM  Result Value Ref Range   TSH 1.300 0.350 - 4.500 uIU/mL    Comment: Performed at [facility]    Dg Knee Complete 4 Views Right  05/21/2014   CLINICAL DATA:  Fall 2 days prior in shower.  Pain and swelling  EXAM: RIGHT KNEE - COMPLETE 4+ VIEW  COMPARISON:  None.  FINDINGS: Frontal,  lateral, and bilateral oblique views were obtained. There is a moderate joint effusion. There is an apparent fracture along the lateral aspect of the medial tibial plateau, apparent only on the frontal view. This fracture does not appear to be significantly displaced on current study. No other fracture apparent. No dislocation. There is moderate narrowing medially and in the patellofemoral joint region. There is spurring medially and in the patellofemoral joint region. There are foci of meniscal calcification. Calcification is noted in the medial aspect of the joint which probably represents residua of old trauma. There are foci of arterial vascular calcification.  IMPRESSION: Evidence of a fracture of the lateral aspect of the medial tibial plateau. Moderate joint effusion. Multilevel osteoarthritic change. Meniscal calcification may be seen with osteoarthritis but also may be indicative of calcium pyrophosphate deposition disease which may present clinically as pseudogout. There is atherosclerotic calcification present.   Electronically Signed   By: Lowella Grip M.D.   On: 05/21/2014 14:48   Dg Foot Complete Right  05/21/2014   CLINICAL DATA:  Status post fall 1 week ago with right foot pain. Initial encounter. Difficulty walking.  EXAM: RIGHT FOOT COMPLETE - 3+ VIEW  COMPARISON:  None.  FINDINGS: No acute bony or joint abnormality is identified. Notable degenerative change is seen. First MTP osteoarthritis is noted. Vascular calcifications are identified.  IMPRESSION: No acute abnormality or finding to explain the patient's symptoms.   Electronically Signed   By: Inge Rise M.D.   On: 05/21/2014 13:38    ROS Blood pressure 125/72, pulse 106, temperature 99.2 F (37.3 C), temperature source Oral, resp. rate 20, SpO2 97 %. Physical Exam Physical Examination: General appearance - alert, well appearing, and in no distress Mental status - alert, oriented to person, place, and time Neurological -  alert, oriented, normal speech, no focal findings or movement disorder noted Musculoskeletal -  Right knee moderate effusion, pain on attempted range of motion; compartments soft; no deformity; tender proximal tibia; no instabilityPulses and sensation intact; motor intact RLE  Radiograph- non-displaced, incomplete tibial plateau fracture adjacent to tibial spine    Assessment/Plan: Tibial plateau  fracture- non displaced and incomplete. Treat non-operatively. Touch Down Weight Bearing RLE. Knee immobilizer for comfort. OK for PT to do range of motion exercises  Deetta Siegmann V 05/21/2014, 10:45 PM

## 2014-05-22 ENCOUNTER — Inpatient Hospital Stay (HOSPITAL_COMMUNITY): Payer: Medicare Other

## 2014-05-22 DIAGNOSIS — F1020 Alcohol dependence, uncomplicated: Secondary | ICD-10-CM

## 2014-05-22 DIAGNOSIS — D509 Iron deficiency anemia, unspecified: Secondary | ICD-10-CM

## 2014-05-22 DIAGNOSIS — N3000 Acute cystitis without hematuria: Secondary | ICD-10-CM

## 2014-05-22 DIAGNOSIS — N390 Urinary tract infection, site not specified: Secondary | ICD-10-CM | POA: Diagnosis present

## 2014-05-22 LAB — BASIC METABOLIC PANEL
Anion gap: 19 — ABNORMAL HIGH (ref 5–15)
BUN: 14 mg/dL (ref 6–23)
CO2: 20 mEq/L (ref 19–32)
CREATININE: 1.24 mg/dL — AB (ref 0.50–1.10)
Calcium: 9.1 mg/dL (ref 8.4–10.5)
Chloride: 102 mEq/L (ref 96–112)
GFR calc Af Amer: 46 mL/min — ABNORMAL LOW (ref >90)
GFR, EST NON AFRICAN AMERICAN: 40 mL/min — AB (ref >90)
GLUCOSE: 182 mg/dL — AB (ref 70–99)
POTASSIUM: 3.5 meq/L — AB (ref 3.7–5.3)
Sodium: 141 mEq/L (ref 137–147)

## 2014-05-22 LAB — URINALYSIS, ROUTINE W REFLEX MICROSCOPIC
Bilirubin Urine: NEGATIVE
Glucose, UA: NEGATIVE mg/dL
Hgb urine dipstick: NEGATIVE
Ketones, ur: NEGATIVE mg/dL
NITRITE: NEGATIVE
PH: 6 (ref 5.0–8.0)
Protein, ur: 30 mg/dL — AB
SPECIFIC GRAVITY, URINE: 1.01 (ref 1.005–1.030)
UROBILINOGEN UA: 0.2 mg/dL (ref 0.0–1.0)

## 2014-05-22 LAB — CBC
HEMATOCRIT: 26.8 % — AB (ref 36.0–46.0)
HEMOGLOBIN: 8.1 g/dL — AB (ref 12.0–15.0)
MCH: 21.3 pg — ABNORMAL LOW (ref 26.0–34.0)
MCHC: 30.2 g/dL (ref 30.0–36.0)
MCV: 70.3 fL — AB (ref 78.0–100.0)
Platelets: 488 10*3/uL — ABNORMAL HIGH (ref 150–400)
RBC: 3.81 MIL/uL — ABNORMAL LOW (ref 3.87–5.11)
RDW: 16.7 % — ABNORMAL HIGH (ref 11.5–15.5)
WBC: 11.6 10*3/uL — ABNORMAL HIGH (ref 4.0–10.5)

## 2014-05-22 LAB — BLOOD GAS, ARTERIAL
Acid-base deficit: 6.5 mmol/L — ABNORMAL HIGH (ref 0.0–2.0)
BICARBONATE: 18.4 meq/L — AB (ref 20.0–24.0)
Drawn by: 232811
O2 Content: 5 L/min
O2 Saturation: 97.7 %
PH ART: 7.306 — AB (ref 7.350–7.450)
PO2 ART: 121 mmHg — AB (ref 80.0–100.0)
Patient temperature: 100.5
TCO2: 17.7 mmol/L (ref 0–100)
pCO2 arterial: 38.6 mmHg (ref 35.0–45.0)

## 2014-05-22 LAB — URINE MICROSCOPIC-ADD ON

## 2014-05-22 LAB — MAGNESIUM: Magnesium: 1.3 mg/dL — ABNORMAL LOW (ref 1.5–2.5)

## 2014-05-22 MED ORDER — LEVALBUTEROL HCL 0.63 MG/3ML IN NEBU: 0.6300 mg | INHALATION_SOLUTION | Freq: Four times a day (QID) | RESPIRATORY_TRACT | Status: DC | PRN

## 2014-05-22 MED ORDER — SODIUM CHLORIDE 0.9 % IV SOLN
510.0000 mg | Freq: Once | INTRAVENOUS | Status: AC
  Administered 2014-05-23: 510 mg via INTRAVENOUS
  Filled 2014-05-22: qty 17

## 2014-05-22 MED ORDER — THIAMINE HCL 100 MG/ML IJ SOLN
100.0000 mg | Freq: Every day | INTRAMUSCULAR | Status: DC
  Filled 2014-05-22 (×2): qty 1

## 2014-05-22 MED ORDER — HALOPERIDOL LACTATE 5 MG/ML IJ SOLN
2.0000 mg | Freq: Once | INTRAMUSCULAR | Status: AC
  Administered 2014-05-22: 2 mg via INTRAVENOUS
  Filled 2014-05-22: qty 1

## 2014-05-22 MED ORDER — LORAZEPAM 2 MG/ML IJ SOLN
0.0000 mg | Freq: Two times a day (BID) | INTRAMUSCULAR | Status: AC
  Administered 2014-05-25 – 2014-05-26 (×2): 1 mg via INTRAVENOUS
  Filled 2014-05-22 (×2): qty 1

## 2014-05-22 MED ORDER — MAGNESIUM SULFATE 4 GM/100ML IV SOLN
4.0000 g | Freq: Once | INTRAVENOUS | Status: AC
  Administered 2014-05-22: 4 g via INTRAVENOUS
  Filled 2014-05-22: qty 100

## 2014-05-22 MED ORDER — LORAZEPAM 1 MG PO TABS
1.0000 mg | ORAL_TABLET | Freq: Four times a day (QID) | ORAL | Status: AC | PRN
  Administered 2014-05-23 – 2014-05-24 (×2): 1 mg via ORAL
  Filled 2014-05-22 (×2): qty 1

## 2014-05-22 MED ORDER — CEFTRIAXONE SODIUM IN DEXTROSE 20 MG/ML IV SOLN
1.0000 g | INTRAVENOUS | Status: DC
  Administered 2014-05-22 – 2014-05-23 (×2): 1 g via INTRAVENOUS
  Filled 2014-05-22 (×3): qty 50

## 2014-05-22 MED ORDER — LORAZEPAM 2 MG/ML IJ SOLN: 1.0000 mg | Freq: Four times a day (QID) | INTRAMUSCULAR | Status: AC | PRN

## 2014-05-22 MED ORDER — VITAMIN B-1 100 MG PO TABS
100.0000 mg | ORAL_TABLET | Freq: Every day | ORAL | Status: DC
  Administered 2014-05-23 – 2014-05-27 (×5): 100 mg via ORAL
  Filled 2014-05-22 (×6): qty 1

## 2014-05-22 MED ORDER — FOLIC ACID 1 MG PO TABS
1.0000 mg | ORAL_TABLET | Freq: Every day | ORAL | Status: DC
  Filled 2014-05-22 (×2): qty 1

## 2014-05-22 MED ORDER — ADULT MULTIVITAMIN W/MINERALS CH
1.0000 | ORAL_TABLET | Freq: Every day | ORAL | Status: DC
  Filled 2014-05-22 (×2): qty 1

## 2014-05-22 MED ORDER — LEVALBUTEROL HCL 0.63 MG/3ML IN NEBU
0.6300 mg | INHALATION_SOLUTION | Freq: Once | RESPIRATORY_TRACT | Status: AC
  Administered 2014-05-22: 0.63 mg via RESPIRATORY_TRACT
  Filled 2014-05-22: qty 3

## 2014-05-22 MED ORDER — ENSURE COMPLETE PO LIQD
237.0000 mL | Freq: Two times a day (BID) | ORAL | Status: DC
  Administered 2014-05-23 – 2014-05-27 (×4): 237 mL via ORAL

## 2014-05-22 MED ORDER — LORAZEPAM 2 MG/ML IJ SOLN
0.0000 mg | Freq: Four times a day (QID) | INTRAMUSCULAR | Status: AC
  Administered 2014-05-22 – 2014-05-24 (×6): 2 mg via INTRAVENOUS
  Filled 2014-05-22 (×6): qty 1

## 2014-05-22 NOTE — Progress Notes (Signed)
MEDICATION RELATED CONSULT NOTE - INITIAL   Pharmacy Consult for IV iron Indication: Iron deficiency anemia  Allergies  Allergen Reactions  . Fexofenadine Other (See Comments)    Unknown reaction    Patient Measurements:    Vital Signs: Temp: 98.4 F (36.9 C) (12/16 1325) Temp Source: Oral (12/16 1325) BP: 139/66 mmHg (12/16 1325) Pulse Rate: 106 (12/16 1325) Intake/Output from previous day: 12/15 0701 - 12/16 0700 In: 1048.8 [I.V.:948.8; IV Piggyback:100] Out: 400 [Urine:400] Intake/Output from this shift:    Labs:  Recent Labs  05/21/14 1358 05/21/14 1733 05/22/14 0510  WBC 11.1*  --  11.6*  HGB 9.7*  --  8.1*  HCT 31.7*  --  26.8*  PLT 621*  --  488*  CREATININE 0.66  --  1.24*  MG  --  1.5 1.3*   CrCl cannot be calculated (Unknown ideal weight.).   Microbiology: No results found for this or any previous visit (from the past 720 hour(s)).  Medical History: Past Medical History  Diagnosis Date  . Alcohol abuse   . Brain tumor   . Brain aneurysm   . Arthritis   . Hypertension   . Constipation     last colonoscopy 2011  . Colon polyps 06/07/2009  . Dementia     Medications:  Scheduled:  . cefTRIAXone (ROCEPHIN)  IV  1 g Intravenous Q24H  . enoxaparin (LOVENOX) injection  40 mg Subcutaneous Q24H  . feeding supplement (ENSURE COMPLETE)  237 mL Oral BID BM  . folic acid  1 mg Oral Daily  . folic acid  1 mg Oral Daily  . LORazepam  0-4 mg Intravenous Q6H   Followed by  . [START ON 05/24/2014] LORazepam  0-4 mg Intravenous Q12H  . losartan  50 mg Oral Daily  . magnesium sulfate 1 - 4 g bolus IVPB  4 g Intravenous Once  . mirtazapine  15 mg Oral QHS  . multivitamin with minerals  1 tablet Oral Daily  . multivitamin with minerals  1 tablet Oral Daily  . pantoprazole  40 mg Oral Daily  . potassium chloride SA  40 mEq Oral Daily  . senna  1 tablet Oral BID  . thiamine  100 mg Oral Daily   Or  . thiamine  100 mg Intravenous Daily  . thiamine  100  mg Oral Daily   Infusions:  . sodium chloride 0.9 % 1,000 mL with potassium chloride 40 mEq infusion 75 mL/hr at 05/22/14 2052   PRN: acetaminophen **OR** acetaminophen, hydrALAZINE, HYDROcodone-acetaminophen, levalbuterol, LORazepam **OR** LORazepam, magnesium hydroxide, morphine injection  Assessment: 80 year old female with history of hypertension, GERD, active alcohol use brought to the hospital with severe pain in her right knee and foot s/p fall in the shower.  She is currently being treated for tibial fracture, cellulitis of the right foot and possible UTI.  Pharmacy is consulted to dose IV iron for severe iron deficiency anemia.  Anemia profile on 12/15: Iron < 10 UIBC 265 TIBC not calculated Sat ratios not calculated  CBC today 12/16: Hgb 8.1 Hct: 26.8  Goal of Therapy:  Iron repletion  Plan:   Feraheme 510mg  IVPB x 1 dose tonight  Monitor for signs/symptoms of hypersensitivity reactions  May consider repeat dose in 3 to 8 days  Peggyann Juba, PharmD, BCPS Pager: [phone] 05/22/2014,9:11 PM

## 2014-05-22 NOTE — Progress Notes (Signed)
INITIAL NUTRITION ASSESSMENT  DOCUMENTATION CODES Per approved criteria  -Severe malnutrition in the context of chronic illness  Pt meets criteria for severe MALNUTRITION in the context of chronic illness as evidenced by severe muscle wasting, likely < 75% PO intake for > one month.   INTERVENTION: -Recommend Ensure Complete po BID, each supplement provides 350 kcal and 13 grams of protein -Recommend updated weight (current weight from 10/2013) -Consider liberalizing diet to encourage PO intake -RD to continue to monitor  NUTRITION DIAGNOSIS: Malnutrition related to chronic illness as evidenced by severe muscle wasting, likely PO intake < 75% for > one month  Goal: Pt to meet >/= 90% of their estimated nutrition needs    Monitor:  Total protein/energy intake, labs, weights, supplement tolerance  Reason for Assessment: Consult to Assess  80 y.o. female  Admitting Dx: Cellulitis of right foot  ASSESSMENT: 80 year old female with history of hypertension, GERD, active alcohol use brought to the hospital with severe pain in her right knee and foot. Patient had a fall in her shower about a week back and landed on her right knee and her wrist at that time. She came to the ED due to pain and tenderness in her wrist and had an x-ray which was negative for fracture. Patient was discharged home with some pain medications and felt fine however for the past few days she has noticed increase redness and swelling in her right foot along with swelling in her right knee being unable to be at any weight  -Pt confused, restless, and trying to get out of bed during time of RD assessment. Unable to provide sufficient food/nutrition; confirmed poor appetite and use of Ensure/Boost supplements -MD noted pt with 1 pint liquor use daily, liking contributing to sub-optimal nutrition intake.  Continue with multivitamin supplement -Current PO intake 0%, declined breakfast when RD offered meal ordering  assistance -Mg/K low; being replaced -NT noted pt restless all morning and had dyspnea episode earlier this morning; staff continues to monitor -Nutrition Focused Physical Exam:  Subcutaneous Fat:  Orbital Region: WDL Upper Arm Region: WDL Thoracic and Lumbar Region: n/a  Muscle:  Temple Region: moderate wasting Clavicle Bone Region: WDL Clavicle and Acromion Bone Region: WDL Scapular Bone Region: WDL Dorsal Hand: WDL Patellar Region: severe wasting Anterior Thigh Region: severe wasting Posterior Calf Region: severe wasting  Edema: None noted     Height: Ht Readings from Last 1 Encounters:  10/14/13 4\' 7"  (1.397 m)    Weight: Wt Readings from Last 1 Encounters:  10/14/13 103 lb 6.4 oz (46.902 kg)    Ideal Body Weight: 87.5 lb  % Ideal Body Weight: 118%  Wt Readings from Last 10 Encounters:  10/14/13 103 lb 6.4 oz (46.902 kg)  09/29/13 103 lb (46.72 kg)  02/14/08 105 lb (47.628 kg)    Usual Body Weight: ~100-105 lb per medical records  % Usual Body Weight: 100% (no new weigt)  BMI:  There is no weight on file to calculate BMI.  Estimated Nutritional Needs: Kcal: 1400-1600 Protein: 55-70 gram Fluid: >/= 1500 ml daily  Skin: cellulitis on right foot  Diet Order: Diet 2 gram sodium  EDUCATION NEEDS: -No education needs identified at this time   Intake/Output Summary (Last 24 hours) at 05/22/14 1010 Last data filed at 05/22/14 0539  Gross per 24 hour  Intake 1048.75 ml  Output    400 ml  Net 648.75 ml    Last BM: PTA   Labs:   Recent Labs Lab  05/21/14 1358 05/21/14 1733 05/22/14 0510  NA 143  --  141  K 2.6*  --  3.5*  CL 96  --  102  CO2 29  --  20  BUN 11  --  14  CREATININE 0.66  --  1.24*  CALCIUM 10.1  --  9.1  MG  --  1.5 1.3*  GLUCOSE 90  --  182*    CBG (last 3)  No results for input(s): GLUCAP in the last 72 hours.  Scheduled Meds: . clindamycin (CLEOCIN) IV  600 mg Intravenous 3 times per day  . enoxaparin  (LOVENOX) injection  40 mg Subcutaneous Q24H  . folic acid  1 mg Oral Daily  . losartan  50 mg Oral Daily  . mirtazapine  15 mg Oral QHS  . multivitamin with minerals  1 tablet Oral Daily  . pantoprazole  40 mg Oral Daily  . potassium chloride SA  40 mEq Oral Daily  . senna  1 tablet Oral BID  . thiamine  100 mg Oral Daily   Or  . thiamine  100 mg Intravenous Daily    Continuous Infusions: . sodium chloride 0.9 % 1,000 mL with potassium chloride 40 mEq infusion 75 mL/hr at 05/22/14 0536    Past Medical History  Diagnosis Date  . Alcohol abuse   . Brain tumor   . Brain aneurysm   . Arthritis   . Hypertension   . Constipation     last colonoscopy 2011  . Colon polyps 06/07/2009  . Dementia     Past Surgical History  Procedure Laterality Date  . Brain surgery    . Abdominal hysterectomy      DUB; removed B ovaries.    Atlee Abide MS RD LDN Clinical Dietitian [phone]

## 2014-05-22 NOTE — Progress Notes (Signed)
PT Cancellation Note  Patient Details Name: Laura May MRN: [id number] DOB: 1931-12-13   Cancelled Treatment:    Reason Eval/Treat Not Completed: Patient declined, no reason specified Per staff, pt attempting to get OOB this morning however upon talking with pt, she declines mobility at this time.  Will check back as schedule permits.   Pike Scantlebury,KATHrine E 05/22/2014, 2:39 PM Carmelia Bake, PT, DPT 05/22/2014 Pager: [phone]

## 2014-05-22 NOTE — Progress Notes (Signed)
[facility] PROGRESS NOTE  Laura May NWG:[id number] DOB: [birth year] DOA: 05/21/2014 PCP: No PCP Per Patient  Assessment/Plan: #1 right foot cellulitis Clinical improvement. Patient on IV clindamycin. Patient currently afebrile. Patient with a leukocytosis. Change IV clindamycin to IV Rocephin. Follow.  #2 probable urinary tract infection Urine cultures pending. Will place on IV Rocephin.  #3 severe deficiency anemia H&H stable. Will give a dose of IV iron. Will need oral iron supplementation. Follow H&H. Outpatient follow-up.  #4 tibial plateau fracture nondisplaced Patient has been seen about the pedis and recommended nonoperative conservative treatment. Knee immobilizer. Touchdown weightbearing right lower extremity. Outpatient follow-up.  #5 hypokalemia Magnesium level at 1.3. Replete magnesium. Replete potassium.  #6 hypertension Stable. Continue Cozaar.  #7 history of alcohol abuse Will place on the Ativan  CIWA withdrawal protocol. Thiamine. Multivitamin. Folic acid.  #8 GERD PPI  #9 Prophylaxis PPI for GI, Lovenox for DVT   Code Status: Full Family Communication: Updated patient no family at bedside. Disposition Plan: Home when medically stable.   Consultants:  Orthopedics. Dr.Aluisio 05/21/2014  Procedures:  Chest x-ray 05/22/2014  X-ray of the right knee 05/21/2014  X-ray of the right foot 05/21/2014  Antibiotics:  IV clindamycin 05/21/2014>>>> 05/22/2014  IV Rocephin 05/21/2014  HPI/Subjective: Patient sleeping however arousable. Patient denies any pain. Events overnight noted.  Objective: Filed Vitals:   05/22/14 0935  BP: 144/65  Pulse: 108  Temp: 99.8 F (37.7 C)  Resp: 20    Intake/Output Summary (Last 24 hours) at 05/22/14 1353 Last data filed at 05/22/14 0539  Gross per 24 hour  Intake 1048.75 ml  Output    400 ml  Net 648.75 ml   There were no vitals filed for this visit.  Exam:   General:   NAD  Cardiovascular: RRR  Respiratory: CTAB. No wheezing, no crackles, rhonchi  Abdomen: Soft, nontender, nondistended, positive bowel sounds.  Musculoskeletal: No clubbing cyanosis or edema. Right lower extremity knee mobilizer. Right ankle with some slight erythema and some slight warmth.  Data Reviewed: Basic Metabolic Panel:  Recent Labs Lab 05/21/14 1358 05/21/14 1733 05/22/14 0510  NA 143  --  141  K 2.6*  --  3.5*  CL 96  --  102  CO2 29  --  20  GLUCOSE 90  --  182*  BUN 11  --  14  CREATININE 0.66  --  1.24*  CALCIUM 10.1  --  9.1  MG  --  1.5 1.3*   Liver Function Tests: No results for input(s): AST, ALT, ALKPHOS, BILITOT, PROT, ALBUMIN in the last 168 hours. No results for input(s): LIPASE, AMYLASE in the last 168 hours. No results for input(s): AMMONIA in the last 168 hours. CBC:  Recent Labs Lab 05/21/14 1358 05/22/14 0510  WBC 11.1* 11.6*  NEUTROABS 7.9*  --   HGB 9.7* 8.1*  HCT 31.7* 26.8*  MCV 71.2* 70.3*  PLT 621* 488*   Cardiac Enzymes: No results for input(s): CKTOTAL, CKMB, CKMBINDEX, TROPONINI in the last 168 hours. BNP (last 3 results) No results for input(s): PROBNP in the last 8760 hours. CBG: No results for input(s): GLUCAP in the last 168 hours.  No results found for this or any previous visit (from the past 240 hour(s)).   Studies: Dg Chest Port 1 View  05/22/2014   CLINICAL DATA:  Increasing shortness of breath with hypoxia  EXAM: PORTABLE CHEST - 1 VIEW  COMPARISON:  None.  FINDINGS: No cardiomegaly. Negative aortic and hilar contours for  age. There is interstitial coarsening but no edema or pneumonia. Equivocal, trace right pleural effusion. No pneumothorax.  IMPRESSION: No edema or pneumonia.   Electronically Signed   By: Jorje Guild M.D.   On: 05/22/2014 05:38   Dg Knee Complete 4 Views Right  05/21/2014   CLINICAL DATA:  Fall 2 days prior in shower.  Pain and swelling  EXAM: RIGHT KNEE - COMPLETE 4+ VIEW  COMPARISON:   None.  FINDINGS: Frontal, lateral, and bilateral oblique views were obtained. There is a moderate joint effusion. There is an apparent fracture along the lateral aspect of the medial tibial plateau, apparent only on the frontal view. This fracture does not appear to be significantly displaced on current study. No other fracture apparent. No dislocation. There is moderate narrowing medially and in the patellofemoral joint region. There is spurring medially and in the patellofemoral joint region. There are foci of meniscal calcification. Calcification is noted in the medial aspect of the joint which probably represents residua of old trauma. There are foci of arterial vascular calcification.  IMPRESSION: Evidence of a fracture of the lateral aspect of the medial tibial plateau. Moderate joint effusion. Multilevel osteoarthritic change. Meniscal calcification may be seen with osteoarthritis but also may be indicative of calcium pyrophosphate deposition disease which may present clinically as pseudogout. There is atherosclerotic calcification present.   Electronically Signed   By: Lowella Grip M.D.   On: 05/21/2014 14:48   Dg Foot Complete Right  05/21/2014   CLINICAL DATA:  Status post fall 1 week ago with right foot pain. Initial encounter. Difficulty walking.  EXAM: RIGHT FOOT COMPLETE - 3+ VIEW  COMPARISON:  None.  FINDINGS: No acute bony or joint abnormality is identified. Notable degenerative change is seen. First MTP osteoarthritis is noted. Vascular calcifications are identified.  IMPRESSION: No acute abnormality or finding to explain the patient's symptoms.   Electronically Signed   By: Inge Rise M.D.   On: 05/21/2014 13:38    Scheduled Meds: . cefTRIAXone (ROCEPHIN)  IV  1 g Intravenous Q24H  . clindamycin (CLEOCIN) IV  600 mg Intravenous 3 times per day  . enoxaparin (LOVENOX) injection  40 mg Subcutaneous Q24H  . feeding supplement (ENSURE COMPLETE)  237 mL Oral BID BM  . folic acid   1 mg Oral Daily  . losartan  50 mg Oral Daily  . mirtazapine  15 mg Oral QHS  . multivitamin with minerals  1 tablet Oral Daily  . pantoprazole  40 mg Oral Daily  . potassium chloride SA  40 mEq Oral Daily  . senna  1 tablet Oral BID  . thiamine  100 mg Oral Daily   Continuous Infusions: . sodium chloride 0.9 % 1,000 mL with potassium chloride 40 mEq infusion 75 mL/hr at 05/22/14 0536    Principal Problem:   Cellulitis of right foot Active Problems:   Anemia   Hypokalemia   Essential hypertension   GERD (gastroesophageal reflux disease)   Alcoholism   Fracture of right tibial plateau   UTI (urinary tract infection)    Time spent: 18 minutes    THOMPSON,DANIEL M.D. [facility] Pager [phone](. If 7PM-7AM, please contact night-coverage at [email or web address], password [facility] 05/22/2014, 1:53 PM  LOS: 1 day

## 2014-05-22 NOTE — Progress Notes (Signed)
Patient attempting to crawl out of bed, noted increased shortness of breath, with audible wheezing, breath sounds per auscultation wheezing with little to no air movement through out all lobes. SpO2 74 on room  Air, oxygen per nasal canal  at 2 liters and rapid response called, K Schorr PA-C notified, blood pressure elevated see medicated recorded.

## 2014-05-22 NOTE — Progress Notes (Signed)
Follow-up:  Notified by RN that pt noted attempting to get OOB. When staff responded pt noted w/ increased WOB and audible wheezing. Sats noted in 70's briefly. Pt was placed on 5L [place] and sats quickly returned to 100%. RRT and RR RN paged and responded to bedside. Xopenex neb was initiated. Pt had an similar episode earlier this morning around midnight when she became quite agitated and attempted to get OOB. RN reported noted mild wheezing at that time that seemed to improve after pt settled back to sleep.  Pt has been very confused and at times restless and agitated. Small dose of IV Haldol given at approx 0130.  Stat PCXR requested. NP to bedside. At bedside pt noted w/ moderately increased WOB (RR-28-34). BBS diminished w/ decreased air movement and expiratory wheezes. BP-127/87, P-120's (has been tachycardic in low 100's) and currently has low grade fever of 100.5. Pt denies CP. After neb pt w/ greatly improved WOB and 02 sats remains 100% now on 2L [place]. ABG reveals pH-7.3, pC02-38.6, p02-121 and bicarb of 18.4. PCXR reveals no edema or pneumonia. At the time of my departure pt resting quietly in NAD.  Assessment/Plan: 1. Dypsnea w/ hypoxia:  Etiology unclear. No documented h/o asthma, COPD or other chronic pulmonary disease. MD notes indicate pt is non-smoker.  ABG/CXR unremarkable. Resolved w/ neb and 2L . Will schedule PRN Xopenex nebs.  2. Fever: Low grade since admission. Likely driven by cellulitis. Continue Clindamycin. Tylenol as indicated.  3. Tachycardia: Likely multifactorial given infectious process, low grade temp, agitation/restlessness, ?ETOH withdrawal. Continue CIWA.  Pt stabilized in room. Will continue to monitor closely on med-surg w/ low threshold to transfer to higher level of care if she deteriorates.   Jeryl Columbia, NP-C [facility] Pager [phone]

## 2014-05-23 ENCOUNTER — Inpatient Hospital Stay (HOSPITAL_COMMUNITY): Payer: Medicare Other

## 2014-05-23 DIAGNOSIS — E43 Unspecified severe protein-calorie malnutrition: Secondary | ICD-10-CM | POA: Diagnosis present

## 2014-05-23 DIAGNOSIS — E870 Hyperosmolality and hypernatremia: Secondary | ICD-10-CM | POA: Diagnosis not present

## 2014-05-23 DIAGNOSIS — I10 Essential (primary) hypertension: Secondary | ICD-10-CM

## 2014-05-23 LAB — BASIC METABOLIC PANEL
ANION GAP: 20 — AB (ref 5–15)
Anion gap: 16 — ABNORMAL HIGH (ref 5–15)
BUN: 18 mg/dL (ref 6–23)
BUN: 16 mg/dL (ref 6–23)
CALCIUM: 9.5 mg/dL (ref 8.4–10.5)
CO2: 19 mEq/L (ref 19–32)
CO2: 21 mEq/L (ref 19–32)
Calcium: 9.6 mg/dL (ref 8.4–10.5)
Chloride: 110 mEq/L (ref 96–112)
Chloride: 109 mEq/L (ref 96–112)
Creatinine, Ser: 1.21 mg/dL — ABNORMAL HIGH (ref 0.50–1.10)
Creatinine, Ser: 1.05 mg/dL (ref 0.50–1.10)
GFR calc Af Amer: 57 mL/min — ABNORMAL LOW (ref >90)
GFR, EST AFRICAN AMERICAN: 48 mL/min — AB (ref >90)
GFR, EST NON AFRICAN AMERICAN: 41 mL/min — AB (ref >90)
GFR, EST NON AFRICAN AMERICAN: 49 mL/min — AB (ref >90)
Glucose, Bld: 100 mg/dL — ABNORMAL HIGH (ref 70–99)
Glucose, Bld: 102 mg/dL — ABNORMAL HIGH (ref 70–99)
POTASSIUM: 4.5 meq/L (ref 3.7–5.3)
Potassium: 4.4 mEq/L (ref 3.7–5.3)
SODIUM: 149 meq/L — AB (ref 137–147)
SODIUM: 146 meq/L (ref 137–147)

## 2014-05-23 LAB — CBC
HCT: 28.1 % — ABNORMAL LOW (ref 36.0–46.0)
Hemoglobin: 8.5 g/dL — ABNORMAL LOW (ref 12.0–15.0)
MCH: 21.5 pg — AB (ref 26.0–34.0)
MCHC: 30.2 g/dL (ref 30.0–36.0)
MCV: 71 fL — ABNORMAL LOW (ref 78.0–100.0)
PLATELETS: 514 10*3/uL — AB (ref 150–400)
RBC: 3.96 MIL/uL (ref 3.87–5.11)
RDW: 17 % — ABNORMAL HIGH (ref 11.5–15.5)
WBC: 8.3 10*3/uL (ref 4.0–10.5)

## 2014-05-23 LAB — MAGNESIUM: Magnesium: 3.1 mg/dL — ABNORMAL HIGH (ref 1.5–2.5)

## 2014-05-23 MED ORDER — DEXTROSE 5 % IV SOLN
INTRAVENOUS | Status: DC
  Filled 2014-05-23: qty 1000

## 2014-05-23 MED ORDER — DEXTROSE 5 % IV SOLN
INTRAVENOUS | Status: DC
  Administered 2014-05-23 (×2): via INTRAVENOUS

## 2014-05-23 NOTE — Evaluation (Signed)
Physical Therapy Evaluation Patient Details Name: Laura May MRN: [id number] DOB: 1931-12-27 Today's Date: 05/23/2014   History of Present Illness  Pt with R tibial plateau fracture after falling at home.  Pt with hx of ETOH abuse contributing to falls.  Clinical Impression  Pt admitted with above diagnosis. Pt currently with functional limitations due to the deficits listed below (see PT Problem List). Pt will benefit from skilled PT to increase their independence and safety with mobility to allow discharge to the venue listed below.  Due to pt's decreased cognitive status and TDWB status and inability to maintain at this time, recommend SNF for further rehab before returning home so she can achieve highest functional level.     Follow Up Recommendations SNF    Equipment Recommendations  None recommended by PT    Recommendations for Other Services       Precautions / Restrictions Precautions Required Braces or Orthoses: Knee Immobilizer - Right Knee Immobilizer - Right:  (ok to do ROM exercises) Restrictions Weight Bearing Restrictions: Yes RLE Weight Bearing: Touchdown weight bearing      Mobility  Bed Mobility Overal bed mobility: Needs Assistance Bed Mobility: Supine to Sit     Supine to sit: Mod assist        Transfers Overall transfer level: Needs assistance   Transfers: Sit to/from Stand;Stand Pivot Transfers Sit to Stand: +2 physical assistance;Max assist Stand pivot transfers: Max assist;+2 physical assistance       General transfer comment: SPT with use of RW with pt unable to maintain TDWB.  Would probably do better with straight SPT of 2 without AD. Flexed posture.  Ambulation/Gait Ambulation/Gait assistance: Total assist              Stairs            Wheelchair Mobility    Modified Rankin (Stroke Patients Only)       Balance Overall balance assessment: Needs assistance           Standing balance-Leahy Scale: Zero                               Pertinent Vitals/Pain Pain Assessment: Faces Faces Pain Scale: Hurts little more Pain Location: R shoulder Pain Descriptors / Indicators: Grimacing Pain Intervention(s): Monitored during session;Repositioned    Home Living Family/patient expects to be discharged to:: Skilled nursing facility                 Additional Comments: Per notes, pt lives with daughter.  Pt states she has no children and lives alone except for her dog "Pluto".    Prior Function Level of Independence: Independent with assistive device(s)         Comments: Pt reports she ambulated with RW.  Questionable historian.     Hand Dominance        Extremity/Trunk Assessment   Upper Extremity Assessment: Defer to OT evaluation           Lower Extremity Assessment: RLE deficits/detail RLE Deficits / Details: Limited AROM, AAROM 50% of normal range       Communication   Communication: Other (comment) (mumbling, hard to understand)  Cognition Arousal/Alertness: Awake/alert   Overall Cognitive Status: Impaired/Different from baseline Area of Impairment: Safety/judgement;Problem solving;Orientation;Memory;Following commands Orientation Level: Person;Disoriented to   Memory: Decreased short-term memory Following Commands: Follows one step commands inconsistently Safety/Judgement: Decreased awareness of safety   Problem Solving: Slow processing General  Comments: Pt with decrease processing and easily distracted by bed pad, gait belt, etc. Pt was re-oriented twice during session and unable to  recall after 2 mins where she is.    General Comments      Exercises General Exercises - Lower Extremity Ankle Circles/Pumps: AROM;Both;10 reps Heel Slides: AAROM;Right;10 reps      Assessment/Plan    PT Assessment Patient needs continued PT services  PT Diagnosis Difficulty walking   PT Problem List Decreased strength;Decreased range of motion;Decreased  activity tolerance;Decreased mobility;Decreased balance;Decreased coordination;Decreased cognition  PT Treatment Interventions Gait training;Functional mobility training;Therapeutic activities;Therapeutic exercise;DME instruction;Balance training   PT Goals (Current goals can be found in the Care Plan section) Acute Rehab PT Goals Patient Stated Goal: none stated PT Goal Formulation: Patient unable to participate in goal setting Time For Goal Achievement: 06/06/14 Potential to Achieve Goals: Good    Frequency Min 3X/week   Barriers to discharge        Co-evaluation               End of Session Equipment Utilized During Treatment: Gait belt;Right knee immobilizer Activity Tolerance: Patient tolerated treatment well Patient left: in chair;with chair alarm set;Other (comment) (with OT) Nurse Communication: Mobility status         Time: 6384-5364 PT Time Calculation (min) (ACUTE ONLY): 28 min   Charges:   PT Evaluation $Initial PT Evaluation Tier I: 1 Procedure PT Treatments $Therapeutic Activity: 8-22 mins   PT G Codes:          Marajade Lei LUBECK 05/23/2014, 10:41 AM

## 2014-05-23 NOTE — Progress Notes (Signed)
Occupational Therapy Evaluation Patient Details Name: Laura May MRN: [id number] DOB: [birth year] Today's Date: 05/23/2014    History of Present Illness Pt with R tibial plateau fracture after falling at home.  Pt with hx of ETOH abuse contributing to falls.   Clinical Impression   Patient presents to OT requiring extensive assistance needed with ADLs and mobility. Patient will benefit from skilled OT to maximize independence and safety with ADLs.    Follow Up Recommendations  SNF;Supervision/Assistance - 24 hour    Equipment Recommendations  Other (comment) (to be determined at next venue of care)    Recommendations for Other Services       Precautions / Restrictions Precautions Precautions: Fall Required Braces or Orthoses: Knee Immobilizer - Right Knee Immobilizer - Right: On at all times Restrictions Weight Bearing Restrictions: Yes RLE Weight Bearing: Touchdown weight bearing      Mobility Bed Mobility Overal bed mobility: Needs Assistance Bed Mobility: Supine to Sit     Supine to sit: Mod assist        Transfers Overall transfer level: Needs assistance   Transfers: Sit to/from Stand;Stand Pivot Transfers Sit to Stand: +2 physical assistance;Max assist Stand pivot transfers: Max assist;+2 physical assistance       General transfer comment: SPT with use of RW with pt unable to maintain TDWB.  Would probably do better with straight SPT of 2 without AD. Flexed posture.    Balance Overall balance assessment: Needs assistance           Standing balance-Leahy Scale: Zero                              ADL Overall ADL's : Needs assistance/impaired Eating/Feeding: Maximal assistance;Sitting Eating/Feeding Details (indicate cue type and reason): able to bring cup to mouth but then cannot process how to tip cup up to get sip in her mouth Grooming: Wash/dry hands;Wash/dry face;Set up;Sitting   Upper Body Bathing: Maximal assistance    Lower Body Bathing: Maximal assistance;Total assistance                       Functional mobility during ADLs: +2 for physical assistance;+2 for safety/equipment;Rolling walker;Cueing for sequencing;Cueing for safety;Maximal assistance General ADL Comments: Patient in process of getting to EOB with physical therapist and was picking at bed linens, gown. Poor attention to task, confused. Transfer bed to recliner with max A +2 and patient not able to follow TDWB status. She was incontinent of urine during the transfer. Patient able to wash face/hands but was falling asleep during task and needed cues to stay awake and on task. Patient had difficulty drinking from a cup. She could get cup to mouth but then could not problem solve how to tip it up to take a sip.      Vision                     Perception     Praxis      Pertinent Vitals/Pain Pain Assessment: Faces Faces Pain Scale: Hurts little more Pain Location: R shoulder Pain Descriptors / Indicators: Grimacing Pain Intervention(s): Monitored during session;Limited activity within patient's tolerance     Hand Dominance Right   Extremity/Trunk Assessment Upper Extremity Assessment Upper Extremity Assessment: Generalized weakness;Difficult to assess due to impaired cognition   Lower Extremity Assessment Lower Extremity Assessment: Defer to PT evaluation RLE Deficits / Details: Limited AROM, AAROM 50% of  normal range       Communication Communication Communication: Other (comment) (mumbles, difficult to understand)   Cognition Arousal/Alertness: Lethargic Behavior During Therapy: Flat affect Overall Cognitive Status: Impaired/Different from baseline Area of Impairment: Safety/judgement;Problem solving;Orientation;Memory;Following commands Orientation Level: Person;Disoriented to   Memory: Decreased short-term memory Following Commands: Follows one step commands inconsistently Safety/Judgement: Decreased  awareness of safety   Problem Solving: Slow processing General Comments: Pt with decrease processing and easily distracted by bed pad, gait belt, etc. Pt was re-oriented twice during session and unable to  recall after 2 mins where she is.   General Comments       Exercises       Shoulder Instructions      Home Living Family/patient expects to be discharged to:: Skilled nursing facility Living Arrangements: Children                               Additional Comments: Per notes, pt lives with daughter.  Pt states she has no children and lives alone except for her dog "Pluto".      Prior Functioning/Environment Level of Independence: Independent with assistive device(s)        Comments: Pt reports she ambulated with RW.  Questionable historian.    OT Diagnosis: Generalized weakness;Acute pain;Cognitive deficits   OT Problem List: Decreased strength;Decreased range of motion;Decreased activity tolerance;Impaired balance (sitting and/or standing);Decreased cognition;Decreased safety awareness;Decreased knowledge of use of DME or AE;Decreased knowledge of precautions;Pain   OT Treatment/Interventions: Self-care/ADL training;DME and/or AE instruction;Therapeutic activities;Patient/family education    OT Goals(Current goals can be found in the care plan section) Acute Rehab OT Goals Patient Stated Goal: none stated OT Goal Formulation: With patient Time For Goal Achievement: 06/06/14 Potential to Achieve Goals: [place]  OT Frequency: Min 2X/week   Barriers to D/C:            Co-evaluation PT/OT/SLP Co-Evaluation/Treatment: Yes Reason for Co-Treatment: For patient/therapist safety;Complexity of the patient's impairments (multi-system involvement) PT goals addressed during session: Mobility/safety with mobility OT goals addressed during session: ADL's and self-care;Proper use of [employer] equipment and DME      End of Session Equipment Utilized During Treatment:  Rolling walker;Gait belt;Right knee immobilizer  Activity Tolerance: Patient tolerated treatment well Patient left: in chair;with call bell/phone within reach;with chair alarm set   Time: 2951-8841 OT Time Calculation (min): 20 min Charges:  OT General Charges $OT Visit: 1 Procedure OT Evaluation $Initial OT Evaluation Tier I: 1 Procedure OT Treatments $Self Care/Home Management : 8-22 mins G-Codes:    Rey Dansby A 2014-06-03, 11:00 AM

## 2014-05-23 NOTE — Progress Notes (Signed)
[facility] PROGRESS NOTE  Laura May TFT:[id number] DOB: 10-01-1931 DOA: 05/21/2014 PCP: No PCP Per Patient  Assessment/Plan: #1 right foot cellulitis Clinical improvement. Patient on IV Rocephin.  Patient currently afebrile. Leukocytosis trending down. Follow.  #2 probable urinary tract infection Urine cultures with 50,000 colonies of Ecoli. Continue IV Rocephin.  #3 severe deficiency anemia H&H stable. Will need oral iron supplementation. Follow H&H. Outpatient follow-up.  #4 tibial plateau fracture nondisplaced Patient has been seen about the pedis and recommended nonoperative conservative treatment. Knee immobilizer. Touchdown weightbearing right lower extremity. Outpatient follow-up.  #5 hypokalemia/Hypernatremia Magnesium level repleted. Potassium repleted. Change IVF to D5W.  #6 hypertension Stable. Continue Cozaar.  #7 history of alcohol abuse Continue Ativan  CIWA withdrawal protocol. Thiamine. Multivitamin. Folic acid.  #8 GERD PPI  #9 Prophylaxis PPI for GI, Lovenox for DVT   Code Status: Full Family Communication: Updated patient no family at bedside. Disposition Plan: Home when medically stable.   Consultants:  Orthopedics. Dr.Aluisio 05/21/2014  Procedures:  Chest x-ray 05/22/2014  X-ray of the right knee 05/21/2014  X-ray of the right foot 05/21/2014  Antibiotics:  IV clindamycin 05/21/2014>>>> 05/22/2014  IV Rocephin 05/21/2014  HPI/Subjective: Patient sleeping.  Objective: Filed Vitals:   05/23/14 1446  BP: 147/85  Pulse: 104  Temp: 99.3 F (37.4 C)  Resp: 18    Intake/Output Summary (Last 24 hours) at 05/23/14 1726 Last data filed at 05/23/14 1300  Gross per 24 hour  Intake    120 ml  Output      0 ml  Net    120 ml   Filed Weights   05/23/14 1100  Weight: 51.1 kg (112 lb 10.5 oz)    Exam:   General:  NAD. Asleep.  Cardiovascular: RRR  Respiratory: CTAB. No wheezing, no crackles, rhonchi  Abdomen:  Soft, nontender, nondistended, positive bowel sounds.  Musculoskeletal: No clubbing cyanosis or edema. Right lower extremity knee mobilizer. Right ankle with some slight erythema and some slight warmth.  Data Reviewed: Basic Metabolic Panel:  Recent Labs Lab 05/21/14 1358 05/21/14 1733 05/22/14 0510 05/23/14 0445 05/23/14 1449  NA 143  --  141 149* 146  K 2.6*  --  3.5* 4.5 4.4  CL 96  --  102 110 109  CO2 29  --  20 19 21   GLUCOSE 90  --  182* 100* 102*  BUN 11  --  14 18 16   CREATININE 0.66  --  1.24* 1.21* 1.05  CALCIUM 10.1  --  9.1 9.6 9.5  MG  --  1.5 1.3* 3.1*  --    Liver Function Tests: No results for input(s): AST, ALT, ALKPHOS, BILITOT, PROT, ALBUMIN in the last 168 hours. No results for input(s): LIPASE, AMYLASE in the last 168 hours. No results for input(s): AMMONIA in the last 168 hours. CBC:  Recent Labs Lab 05/21/14 1358 05/22/14 0510 05/23/14 0445  WBC 11.1* 11.6* 8.3  NEUTROABS 7.9*  --   --   HGB 9.7* 8.1* 8.5*  HCT 31.7* 26.8* 28.1*  MCV 71.2* 70.3* 71.0*  PLT 621* 488* 514*   Cardiac Enzymes: No results for input(s): CKTOTAL, CKMB, CKMBINDEX, TROPONINI in the last 168 hours. BNP (last 3 results) No results for input(s): PROBNP in the last 8760 hours. CBG: No results for input(s): GLUCAP in the last 168 hours.  Recent Results (from the past 240 hour(s))  Culture, Urine     Status: None (Preliminary result)   Collection Time: 05/22/14 10:11 AM  Result  Value Ref Range Status   Specimen Description URINE, RANDOM  Final   Special Requests NONE  Final   Culture  Setup Time   Final    05/22/2014 14:44 Performed at [place]   Final    50,000 COLONIES/ML Performed at [facility]    Culture   Final    ESCHERICHIA COLI Performed at [facility]    Report Status PENDING  Incomplete     Studies: Dg Chest Port 1 View  05/22/2014   CLINICAL DATA:  Increasing shortness of breath with hypoxia   EXAM: PORTABLE CHEST - 1 VIEW  COMPARISON:  None.  FINDINGS: No cardiomegaly. Negative aortic and hilar contours for age. There is interstitial coarsening but no edema or pneumonia. Equivocal, trace right pleural effusion. No pneumothorax.  IMPRESSION: No edema or pneumonia.   Electronically Signed   By: Jorje Guild M.D.   On: 05/22/2014 05:38    Scheduled Meds: . cefTRIAXone (ROCEPHIN)  IV  1 g Intravenous Q24H  . enoxaparin (LOVENOX) injection  40 mg Subcutaneous Q24H  . feeding supplement (ENSURE COMPLETE)  237 mL Oral BID BM  . folic acid  1 mg Oral Daily  . LORazepam  0-4 mg Intravenous Q6H   Followed by  . [START ON 05/24/2014] LORazepam  0-4 mg Intravenous Q12H  . losartan  50 mg Oral Daily  . mirtazapine  15 mg Oral QHS  . multivitamin with minerals  1 tablet Oral Daily  . pantoprazole  40 mg Oral Daily  . potassium chloride SA  40 mEq Oral Daily  . senna  1 tablet Oral BID  . thiamine  100 mg Oral Daily   Continuous Infusions: . dextrose 75 mL/hr at 05/23/14 0830    Principal Problem:   Cellulitis of right foot Active Problems:   Anemia   Hypokalemia   Essential hypertension   GERD (gastroesophageal reflux disease)   Alcoholism   Fracture of right tibial plateau   UTI (urinary tract infection)   Protein-calorie malnutrition, severe   Hypernatremia    Time spent: 97 minutes    THOMPSON,DANIEL M.D. [facility] Pager [phone](. If 7PM-7AM, please contact night-coverage at [email or web address], password [facility] 05/23/2014, 5:26 PM  LOS: 2 days

## 2014-05-23 NOTE — Progress Notes (Signed)
Clinical Social Work Department BRIEF PSYCHOSOCIAL ASSESSMENT 05/23/2014  Patient:  Laura May, Laura May     Account Number:  [id number]     Admit date:  05/21/2014  Clinical Social Worker:  Maryln Manuel  Date/Time:  05/23/2014 03:30 PM  Referred by:  Physician  Date Referred:  05/23/2014 Referred for  SNF Placement   Other Referral:   Interview type:  Patient Other interview type:   and patient daughter via telephone    PSYCHOSOCIAL DATA Living Status:  FAMILY Admitted from facility:   Level of care:   Primary support name:  Laura May/daughter/[phone] Primary support relationship to patient:  CHILD, ADULT Degree of support available:   adequate    CURRENT CONCERNS Current Concerns  Post-Acute Placement   Other Concerns:    SOCIAL WORK ASSESSMENT / PLAN CSW received referral for New SNF.    CSW met with pt at bedside. CSW introduced self and explained role. Pt alert and oriented, but pt speech slurred and pt difficult to understand. Pt provided permission for CSW to contact pt daughter via telephone. CSW contacted pt daughter, Laura Basta via telephone. Pt daughter stated that pt lives with pt daughter. Pt daughter shared that pt was fairly independent prior to admission and pt room is upstairs at pt daughters home. CSW discussed recommendation for ST SNF upon discharge. CSW clarified pt daughter's questions regarding insurance coverage. Pt daughter agreeable to SNF and is hopeful for [facility] as it is close to pt daughters home. CSW expressed understanding and explained process of SNF search.    CSW completed FL2 and initiated SNF search to [facility]. CSW contacted [facility] and notified facility of pt daughter interest in facility. [employer] stated that they would review pt information.    CSW to follow up with pt daughter regarding SNF bed offers. CSW to continue to follow to provide support and assist with pt  disposition needs.   Assessment/plan status:  Psychosocial Support/Ongoing Assessment of Needs Other assessment/ plan:   discharge planning   Information/referral to community resources:   [facility] search    PATIENT'S/FAMILY'S RESPONSE TO PLAN OF CARE: Pt alert and oriented x 4, but pt speech is incomprehesible and very few words are understood. Pt daughter supportive and expressed that she cannot come to the hospital until in the evening. Pt daughter feels SNF will be best plan given the amount of care that pt is requiring at this time. Pt daughter hopeful for [facility].    Alison Murray, MSW, [place] Work [phone]

## 2014-05-23 NOTE — Care Management Note (Signed)
CARE MANAGEMENT NOTE 05/23/2014  Patient:  Laura May, Laura May   Account Number:  [id number]  Date Initiated:  05/23/2014  Documentation initiated by:  Marney Doctor  Subjective/Objective Assessment:   80 yo admitted with cellulitis of the R foot. PMH of hypertension, GERD, active alcohol abuse     Action/Plan:   From home with daughter   Anticipated DC Date:  05/25/2014   Anticipated DC Plan:  [place]  In-house referral  Clinical Social Worker      DC Planning Services  CM consult      Choice offered to / List presented to:             Status of service:  In process, will continue to follow Medicare Important Message given?   (If response is "NO", the following Medicare IM given date fields will be blank) Date Medicare IM given:   Medicare IM given by:   Date Additional Medicare IM given:   Additional Medicare IM given by:    Discharge Disposition:    Per UR Regulation:  Reviewed for med. necessity/level of care/duration of stay  If discussed at [place] of Stay Meetings, dates discussed:    Comments:  05/23/14 Marney Doctor RN,BSN,NCM [phone] Chart reviewed. PT is recommending SNF.  CM will continue to follow and assist as needed.

## 2014-05-23 NOTE — Progress Notes (Addendum)
Clinical Social Work Department CLINICAL SOCIAL WORK PLACEMENT NOTE 05/23/2014  Patient:  Laura May, Laura May  Account Number:  [id number] Admit date:  05/21/2014  Clinical Social Worker:  Maryln Manuel  Date/time:  05/23/2014 03:45 PM  Clinical Social Work is seeking post-discharge placement for this patient at the following level of care:   SKILLED NURSING   (*CSW will update this form in Epic as items are completed)   05/23/2014  Patient/family provided with [place] Department of Clinical Social Work's list of facilities offering this level of care within the geographic area requested by the patient (or if unable, by the patient's family).  05/23/2014  Patient/family informed of their freedom to choose among providers that offer the needed level of care, that participate in Medicare, Medicaid or managed care program needed by the patient, have an available bed and are willing to accept the patient.  05/23/2014  Patient/family informed of [facility]' ownership interest in [facility], as well as of the fact that they are under no obligation to receive care at this facility.  PASARR submitted to EDS on 05/23/2014 PASARR number received on 05/23/2014  FL2 transmitted to all facilities in geographic area requested by pt/family on  05/23/2014 FL2 transmitted to all facilities within larger geographic area on   Patient informed that his/her managed care company has contracts with or will negotiate with  certain facilities, including the following:     Patient/family informed of bed offers received:  05/24/2014 Patient chooses bed at [facility] Physician recommends and patient chooses bed at    Patient to be transferred to  on  [facility] on 05/27/2014 Patient to be transferred to facility by ambulance Corey Harold) Patient and family notified of transfer on 05/27/2014 Name of family member notified:  Pt notified at bedside, pt daughter, Vaughan Basta  notified via telephone  The following physician request were entered in Epic:   Additional Comments:   Alison Murray, MSW, [place] Work [phone]

## 2014-05-24 ENCOUNTER — Inpatient Hospital Stay (HOSPITAL_COMMUNITY): Payer: Medicare Other

## 2014-05-24 DIAGNOSIS — G9340 Encephalopathy, unspecified: Secondary | ICD-10-CM | POA: Diagnosis not present

## 2014-05-24 LAB — CBC
HCT: 26.9 % — ABNORMAL LOW (ref 36.0–46.0)
HEMOGLOBIN: 8.4 g/dL — AB (ref 12.0–15.0)
MCH: 21.8 pg — ABNORMAL LOW (ref 26.0–34.0)
MCHC: 31.2 g/dL (ref 30.0–36.0)
MCV: 69.7 fL — ABNORMAL LOW (ref 78.0–100.0)
PLATELETS: 585 10*3/uL — AB (ref 150–400)
RBC: 3.86 MIL/uL — AB (ref 3.87–5.11)
RDW: 16.7 % — ABNORMAL HIGH (ref 11.5–15.5)
WBC: 9.4 10*3/uL (ref 4.0–10.5)

## 2014-05-24 LAB — BASIC METABOLIC PANEL
ANION GAP: 13 (ref 5–15)
BUN: 13 mg/dL (ref 6–23)
CO2: 24 mEq/L (ref 19–32)
Calcium: 9.3 mg/dL (ref 8.4–10.5)
Chloride: 105 mEq/L (ref 96–112)
Creatinine, Ser: 0.95 mg/dL (ref 0.50–1.10)
GFR calc Af Amer: 64 mL/min — ABNORMAL LOW (ref >90)
GFR, EST NON AFRICAN AMERICAN: 55 mL/min — AB (ref >90)
GLUCOSE: 105 mg/dL — AB (ref 70–99)
Potassium: 4 mEq/L (ref 3.7–5.3)
SODIUM: 142 meq/L (ref 137–147)

## 2014-05-24 LAB — RPR

## 2014-05-24 LAB — FOLATE RBC: RBC Folate: 970 ng/mL — ABNORMAL HIGH (ref >280)

## 2014-05-24 LAB — HIV ANTIBODY (ROUTINE TESTING W REFLEX): HIV 1&2 Ab, 4th Generation: NONREACTIVE

## 2014-05-24 LAB — VITAMIN B12: VITAMIN B 12: 390 pg/mL (ref 211–911)

## 2014-05-24 MED ORDER — CEPHALEXIN 250 MG/5ML PO SUSR
500.0000 mg | Freq: Three times a day (TID) | ORAL | Status: DC
  Administered 2014-05-24 (×2): 500 mg via ORAL
  Filled 2014-05-24 (×3): qty 10

## 2014-05-24 NOTE — Progress Notes (Signed)
[facility] PROGRESS NOTE  Laura May VQM:[id number] DOB: 08/01/1931 DOA: 05/21/2014 PCP: No PCP Per Patient  Assessment/Plan: #1 right foot cellulitis Clinical improvement. Patient currently afebrile. Leukocytosis trending down. Change IV Rocephin to oral Keflex.  #2 acute encephalopathy Likely secondary to Ativan. Decrease Ativan dose by half. CT of the head negative. MRI of the head negative. RPR nonreactive. HIV nonreactive. RBC folate is 970. B-12 levels at 390. Monitor follow.  #3 Bacteruria/ probable urinary tract infection Urine cultures with 50,000 colonies of Ecoli.   #4 severe deficiency anemia H&H stable. Will need oral iron supplementation. Follow H&H. Outpatient follow-up.  #5 tibial plateau fracture nondisplaced Patient has been seen by orthopedics and recommended nonoperative conservative treatment. Knee immobilizer. Touchdown weightbearing right lower extremity. Outpatient follow-up.  #6 hypokalemia/Hypernatremia Magnesium level repleted. Potassium repleted. Continue IVF.  #7 hypertension Stable. Continue Cozaar.  #8 history of alcohol abuse Continue Ativan  CIWA withdrawal protocol, and decrease dose by 1/2. Thiamine. Multivitamin. Folic acid.  #9 GERD PPI  #10 Prophylaxis PPI for GI, Lovenox for DVT   Code Status: Full Family Communication: Updated patient no family at bedside. Disposition Plan: SNF when medically stable.   Consultants:  Orthopedics. Dr.Aluisio 05/21/2014  Procedures:  Chest x-ray 05/22/2014  X-ray of the right knee 05/21/2014  X-ray of the right foot 05/21/2014  CT head 05/23/14  MRI 05/24/14  Antibiotics:  IV clindamycin 05/21/2014>>>> 05/22/2014  IV Rocephin 05/21/2014 >>> 05/24/2014  Oral Keflex 05/24/2014  HPI/Subjective: Patient a little more alert today.  Objective: Filed Vitals:   05/24/14 1458  BP: 154/89  Pulse: 99  Temp: 98.7 F (37.1 C)  Resp: 20    Intake/Output Summary (Last 24  hours) at 05/24/14 1734 Last data filed at 05/23/14 2044  Gross per 24 hour  Intake      0 ml  Output      0 ml  Net      0 ml   Filed Weights   05/23/14 1100  Weight: 51.1 kg (112 lb 10.5 oz)    Exam:   General:  NAD. Asleep.  Cardiovascular: RRR  Respiratory: CTAB. No wheezing, no crackles, rhonchi  Abdomen: Soft, nontender, nondistended, positive bowel sounds.  Musculoskeletal: No clubbing cyanosis or edema. Right lower extremity knee mobilizer. Right ankle with some slight erythema and some slight warmth.  Data Reviewed: Basic Metabolic Panel:  Recent Labs Lab 05/21/14 1358 05/21/14 1733 05/22/14 0510 05/23/14 0445 05/23/14 1449 05/24/14 0530  NA 143  --  141 149* 146 142  K 2.6*  --  3.5* 4.5 4.4 4.0  CL 96  --  102 110 109 105  CO2 29  --  20 19 21 24   GLUCOSE 90  --  182* 100* 102* 105*  BUN 11  --  14 18 16 13   CREATININE 0.66  --  1.24* 1.21* 1.05 0.95  CALCIUM 10.1  --  9.1 9.6 9.5 9.3  MG  --  1.5 1.3* 3.1*  --   --    Liver Function Tests: No results for input(s): AST, ALT, ALKPHOS, BILITOT, PROT, ALBUMIN in the last 168 hours. No results for input(s): LIPASE, AMYLASE in the last 168 hours. No results for input(s): AMMONIA in the last 168 hours. CBC:  Recent Labs Lab 05/21/14 1358 05/22/14 0510 05/23/14 0445 05/24/14 0530  WBC 11.1* 11.6* 8.3 9.4  NEUTROABS 7.9*  --   --   --   HGB 9.7* 8.1* 8.5* 8.4*  HCT 31.7* 26.8* 28.1* 26.9*  MCV 71.2* 70.3* 71.0* 69.7*  PLT 621* 488* 514* 585*   Cardiac Enzymes: No results for input(s): CKTOTAL, CKMB, CKMBINDEX, TROPONINI in the last 168 hours. BNP (last 3 results) No results for input(s): PROBNP in the last 8760 hours. CBG: No results for input(s): GLUCAP in the last 168 hours.  Recent Results (from the past 240 hour(s))  Culture, Urine     Status: None (Preliminary result)   Collection Time: 05/22/14 10:11 AM  Result Value Ref Range Status   Specimen Description URINE, RANDOM  Final    Special Requests NONE  Final   Culture  Setup Time   Final    05/22/2014 14:44 Performed at [place]   Final    50,000 COLONIES/ML Performed at [facility]    Culture   Final    ESCHERICHIA COLI Performed at [facility]    Report Status PENDING  Incomplete     Studies: Ct Head Wo Contrast  05/23/2014   CLINICAL DATA:  Altered mental status, confusion  EXAM: CT HEAD WITHOUT CONTRAST  TECHNIQUE: Contiguous axial images were obtained from the base of the skull through the vertex without intravenous contrast.  COMPARISON:  09/06/2013  FINDINGS: Postsurgical changes are noted in the left frontal parietal region consistent with the patient's given clinical history. Mild atrophic changes are noted. No findings to suggest acute hemorrhage, acute infarction or space-occupying mass lesion are noted.  IMPRESSION: Chronic atrophic changes without acute abnormality   Electronically Signed   By: Inez Catalina M.D.   On: 05/23/2014 19:30   Mr Brain Wo Contrast  05/24/2014   CLINICAL DATA:  80 year old female with extreme confusion. Altered mental status. Initial encounter.  EXAM: MRI HEAD WITHOUT CONTRAST  TECHNIQUE: Multiplanar, multiecho pulse sequences of the brain and surrounding structures were obtained without intravenous contrast.  COMPARISON:  Head CTs without contrast 05/23/2014 and earlier.  FINDINGS: The examination had to be discontinued prior to completion due to patient altered mental status, agitation.  Axial diffusion-weighted imaging, sagittal T1 weighted imaging, and axial T2 weighted imaging is obtained. Some of the imaging is degraded by motion despite repeated attempts.  Sequelae of previous left craniectomy. Stable cerebral volume. No midline shift, mass effect, or evidence of intracranial mass lesion. No ventriculomegaly. Major intracranial vascular flow voids are grossly preserved. No restricted diffusion or evidence of acute infarction.  Moderate for age nonspecific cerebral white matter signal changes.  Chronic left lamina papyracea fracture. Mild right mastoid effusion. No acute orbit or scalp soft tissue findings. Visualized bone marrow signal is within normal limits.  IMPRESSION: No acute intracranial abnormality is identified.  Truncated exam due to patient altered mental status, agitation.   Electronically Signed   By: Lars Pinks M.D.   On: 05/24/2014 13:30    Scheduled Meds: . cephALEXin  500 mg Oral 3 times per day  . enoxaparin (LOVENOX) injection  40 mg Subcutaneous Q24H  . feeding supplement (ENSURE COMPLETE)  237 mL Oral BID BM  . folic acid  1 mg Oral Daily  . LORazepam  0-4 mg Intravenous Q6H   Followed by  . LORazepam  0-4 mg Intravenous Q12H  . losartan  50 mg Oral Daily  . mirtazapine  15 mg Oral QHS  . multivitamin with minerals  1 tablet Oral Daily  . pantoprazole  40 mg Oral Daily  . potassium chloride SA  40 mEq Oral Daily  . senna  1 tablet Oral BID  .  thiamine  100 mg Oral Daily   Continuous Infusions: . dextrose 75 mL/hr at 05/23/14 2111    Principal Problem:   Cellulitis of right foot Active Problems:   Anemia   Hypokalemia   Essential hypertension   GERD (gastroesophageal reflux disease)   Alcoholism   Fracture of right tibial plateau   UTI (urinary tract infection)   Protein-calorie malnutrition, severe   Hypernatremia   Acute encephalopathy    Time spent: 62 minutes    THOMPSON,DANIEL M.D. [facility] Pager [phone](. If 7PM-7AM, please contact night-coverage at [email or web address], password [facility] 05/24/2014, 5:34 PM  LOS: 3 days

## 2014-05-24 NOTE — Evaluation (Signed)
Clinical/Bedside Swallow Evaluation Patient Details  Name: Laura May MRN: [id number] Date of Birth: 23-Jun-1931  Today's Date: 05/24/2014 Time: 0955-1050 SLP Time Calculation (min) (ACUTE ONLY): 55 min  Past Medical History:  Past Medical History  Diagnosis Date  . Alcohol abuse   . Brain tumor   . Brain aneurysm   . Arthritis   . Hypertension   . Constipation     last colonoscopy 2011  . Colon polyps 06/07/2009  . Dementia    Past Surgical History:  Past Surgical History  Procedure Laterality Date  . Brain surgery    . Abdominal hysterectomy      DUB; removed B ovaries.   HPI:  80 year old female with history of hypertension, GERD, active alcohol use brought to the hospital with severe pain in her right knee and foot. Patient had a fall in her shower about a week back and landed on her right knee and her wrist at that time. She came to the ED due to pain and tenderness in her wrist and had an x-ray which was negative for fracture. Patient was discharged home with some pain medications and felt fine however for the past few days she has noticed increase redness and swelling in her right foot along with swelling in her right knee being unable to be at any weight. She denies any pain in her hip. She reports drinking about a pint of liquor every day and her last drink was 4 days back. She denies any shakes or tremors. She denies passing out and she had the fall.    Assessment / Plan / Recommendation Clinical Impression  Pt poorly cooperative, initially very sleepy, then refusing to take more that a few bites/sips.  Pt c/o water "coming back up" and pt was unable to swallow pills whole with applesauce (pills remained in mouth).  Pt was unable to chew and swallow a cracker (reports dentures are at home).  Speech is difficult to understand, and question right hand/arm weakness, as pt was unable to grasp spoon and feed herselft.  Will initiate a Dysphagia 1 (Pureed) diet with thin liquids  as tolerated.  Meds should be crushed and given with applesauce or ice cream.    Aspiration Risk  Mild    Diet Recommendation Dysphagia 1 (Puree);Thin liquid   Liquid Administration via: Straw Medication Administration: Crushed with puree Supervision: Staff to assist with self feeding;Full supervision/cueing for compensatory strategies Compensations: Slow rate;Small sips/bites;Check for pocketing;Follow solids with liquid Postural Changes and/or Swallow Maneuvers: Seated upright 90 degrees    Other  Recommendations Oral Care Recommendations: Oral care BID;Staff/trained caregiver to provide oral care Other Recommendations: Clarify dietary restrictions   Follow Up Recommendations  24 hour supervision/assistance;Skilled Nursing facility    Frequency and Duration min 2x/week  1 week   Pertinent Vitals/Pain No complaints      Swallow Study Prior Functional Status       General HPI: 80 year old female with history of hypertension, GERD, active alcohol use brought to the hospital with severe pain in her right knee and foot. Patient had a fall in her shower about a week back and landed on her right knee and her wrist at that time. She came to the ED due to pain and tenderness in her wrist and had an x-ray which was negative for fracture. Patient was discharged home with some pain medications and felt fine however for the past few days she has noticed increase redness and swelling in her right foot along with  swelling in her right knee being unable to be at any weight. She denies any pain in her hip. She reports drinking about a pint of liquor every day and her last drink was 4 days back. She denies any shakes or tremors. She denies passing out and she had the fall.  Type of Study: Bedside swallow evaluation Previous Swallow Assessment: none Diet Prior to this Study: NPO Temperature Spikes Noted: No Respiratory Status: Room air History of Recent Intubation: No Behavior/Cognition:  Alert;Distractible;Requires cueing;Decreased sustained attention;Confused Oral Cavity - Dentition: Edentulous;Dentures, not available Self-Feeding Abilities: Needs assist Patient Positioning: Upright in bed Baseline Vocal Quality: Clear Volitional Cough: Weak Volitional Swallow: Unable to elicit    Oral/Motor/Sensory Function Overall Oral Motor/Sensory Function: Impaired Labial ROM: Reduced right;Reduced left Labial Symmetry: Within Functional Limits Labial Strength: Reduced Lingual ROM: Reduced right;Reduced left Lingual Symmetry: Within Functional Limits Lingual Strength: Reduced Mandible: Within Functional Limits   Ice Chips Ice chips: Within functional limits Presentation: Spoon   Thin Liquid Thin Liquid: Within functional limits Presentation: Spoon;Straw    Nectar Thick Nectar Thick Liquid: Not tested   Honey Thick Honey Thick Liquid: Not tested   Puree Puree: Within functional limits Presentation: Spoon   Solid   GO    Solid: Impaired Oral Phase Impairments: Reduced lingual movement/coordination;Impaired anterior to posterior transit;Impaired mastication Oral Phase Functional Implications: Oral residue Pharyngeal Phase Impairments: Multiple swallows       Laura May T 05/24/2014,10:51 AM

## 2014-05-24 NOTE — Care Management Note (Signed)
CARE MANAGEMENT NOTE 05/24/2014  Patient:  Laura May, Laura May   Account Number:  [id number]  Date Initiated:  05/23/2014  Documentation initiated by:  Marney Doctor  Subjective/Objective Assessment:   80 yo admitted with cellulitis of the R foot. PMH of hypertension, GERD, active alcohol abuse     Action/Plan:   From home with daughter   Anticipated DC Date:  05/25/2014   Anticipated DC Plan:  [place]  In-house referral  Clinical Social Worker      DC Planning Services  CM consult      Choice offered to / List presented to:             Status of service:  In process, will continue to follow Medicare Important Message given?  YES (If response is "NO", the following Medicare IM given date fields will be blank) Date Medicare IM given:  05/24/2014 Medicare IM given by:  Marney Doctor Date Additional Medicare IM given:   Additional Medicare IM given by:    Discharge Disposition:    Per UR Regulation:  Reviewed for med. necessity/level of care/duration of stay  If discussed at [place] of Stay Meetings, dates discussed:    Comments:  05/23/14 Marney Doctor RN,BSN,NCM [phone] Chart reviewed. PT is recommending SNF.  CM will continue to follow and assist as needed.

## 2014-05-24 NOTE — Progress Notes (Signed)
CSW continuing to follow for disposition planning.  Per MD, pt potential discharge to SNF on Sunday.   CSW spoke with [facility] to determine if facility is able to offer a bed. Per facility liaison, Glyn Ade, weekend CSW will need to contacted facility liaison, Glyn Ade over the weekend to provide further information on how pt has been concerning alcohol withdrawal and CIWA protocol and facility will make determination at that time if they can accept pt.   CSW contacted pt daughter, Vaughan Basta via telephone to discuss. CSW discussed with pt daughter that MD mentioned potential discharge over the weekend. CSW updated pt daughter that [facility] is a possibility, but weekend CSW will speak with [facility] on day of discharge to determine if facility is definitely able to accept pt. CSW discussed with pt daughter that CSW will initiate search to other facilities to have back up options if needed. Pt daughter expressed understanding. Pt daughter shared that she was hopeful to speak with MD regarding her concerns medically.   CSW initiated SNF search to other facilities and left bed offers at bedside for pt daughter to review for secondary options if [facility] is unable to accept.   CSW to continue to follow to provide support and assist with pt disposition needs.   Alison Murray, MSW, [place] Work [phone]

## 2014-05-24 NOTE — Progress Notes (Signed)
  05/24/2014 Patient reports pain as mild in the knee when asked. Patient seen in rounds for Dr. Wynelle Link. Patient is well, and has had no acute complaints or problems They will be Touch Down Weight Bearing Only to the right. Plan is to go Skilled nursing facility after hospital stay.  Vital signs in last 24 hours: Temp:  [99.1 F (37.3 C)-99.4 F (37.4 C)] 99.4 F (37.4 C) (12/18 3382) Pulse Rate:  [104-109] 109 (12/18 0638) Resp:  [18-22] 22 (12/18 0638) BP: (131-147)/(63-85) 146/77 mmHg (12/18 0638) SpO2:  [92 %-96 %] 93 % (12/18 5053) Weight:  [51.1 kg (112 lb 10.5 oz)] 51.1 kg (112 lb 10.5 oz) (12/17 1100)  I&O's: I/O last 3 completed shifts: In: 120 [P.O.:120] Out: -     Labs:  Recent Labs  05/21/14 1358 05/22/14 0510 05/23/14 0445 05/24/14 0530  HGB 9.7* 8.1* 8.5* 8.4*    Recent Labs  05/23/14 0445 05/24/14 0530  WBC 8.3 9.4  RBC 3.96 3.86*  HCT 28.1* 26.9*  PLT 514* 585*    Recent Labs  05/23/14 1449 05/24/14 0530  NA 146 142  K 4.4 4.0  CL 109 105  CO2 21 24  BUN 16 13  CREATININE 1.05 0.95  GLUCOSE 102* 105*  CALCIUM 9.5 9.3   No results for input(s): LABPT, INR in the last 72 hours.   EXAM: General - Patient is Alert Extremity - Neurovascular intact Sensation intact distally Effusion noted in the right knee (expected with the intraarticular fracture) Motor Function - intact, moving foot and toes well on exam.   Past Medical History  Diagnosis Date  . Alcohol abuse   . Brain tumor   . Brain aneurysm   . Arthritis   . Hypertension   . Constipation     last colonoscopy 2011  . Colon polyps 06/07/2009  . Dementia     Assessment/Plan:     Principal Problem:   Cellulitis of right foot Active Problems:   Anemia   Hypokalemia   Essential hypertension   GERD (gastroesophageal reflux disease)   Alcoholism   Fracture of right tibial plateau   UTI (urinary tract infection)   Protein-calorie malnutrition, severe    Hypernatremia  Estimated body mass index is 26.18 kg/(m^2) as calculated from the following:   Height as of 10/14/13: 4\' 7"  (1.397 m).   Weight as of this encounter: 51.1 kg (112 lb 10.5 oz). Discharge to SNF whe medically stable  Knee Immobilizer at all times except for hygiene and bathing. F/U in office in about 2-3 weeks with Dr. Wynelle Link. Activity - may be up as tolerated but Only TDWB to the right leg. Ortho will sign off at this time. Please call the office for any questions or concerns. Dr. Wynelle Link will be out of the office for the next week and a half but his partners will be available if needed. Currently on Lovenox for DVT as per Medicine.  Arlee Muslim, PA-C Orthopaedic Surgery 05/24/2014, 9:22 AM

## 2014-05-25 LAB — URINE CULTURE

## 2014-05-25 LAB — BASIC METABOLIC PANEL
Anion gap: 15 (ref 5–15)
BUN: 9 mg/dL (ref 6–23)
CALCIUM: 9.4 mg/dL (ref 8.4–10.5)
CO2: 22 mEq/L (ref 19–32)
CREATININE: 0.74 mg/dL (ref 0.50–1.10)
Chloride: 100 mEq/L (ref 96–112)
GFR calc Af Amer: >90 mL/min (ref >90)
GFR, EST NON AFRICAN AMERICAN: 78 mL/min — AB (ref >90)
GLUCOSE: 97 mg/dL (ref 70–99)
Potassium: 4.6 mEq/L (ref 3.7–5.3)
Sodium: 137 mEq/L (ref 137–147)

## 2014-05-25 LAB — CBC
HEMATOCRIT: 28.9 % — AB (ref 36.0–46.0)
Hemoglobin: 8.9 g/dL — ABNORMAL LOW (ref 12.0–15.0)
MCH: 21.4 pg — AB (ref 26.0–34.0)
MCHC: 30.8 g/dL (ref 30.0–36.0)
MCV: 69.6 fL — AB (ref 78.0–100.0)
PLATELETS: 563 10*3/uL — AB (ref 150–400)
RBC: 4.15 MIL/uL (ref 3.87–5.11)
RDW: 16.7 % — AB (ref 11.5–15.5)
WBC: 9 10*3/uL (ref 4.0–10.5)

## 2014-05-25 MED ORDER — FOSFOMYCIN TROMETHAMINE 3 G PO PACK
3.0000 g | PACK | ORAL | Status: DC
  Administered 2014-05-25 – 2014-05-27 (×2): 3 g via ORAL
  Filled 2014-05-25 (×2): qty 3

## 2014-05-25 MED ORDER — PSEUDOEPHEDRINE HCL 30 MG/5ML PO SYRP
30.0000 mg | ORAL_SOLUTION | Freq: Two times a day (BID) | ORAL | Status: DC
  Administered 2014-05-25 – 2014-05-27 (×5): 30 mg via ORAL
  Filled 2014-05-25 (×8): qty 5

## 2014-05-25 MED ORDER — SULFAMETHOXAZOLE-TRIMETHOPRIM 800-160 MG PO TABS
1.0000 | ORAL_TABLET | Freq: Two times a day (BID) | ORAL | Status: DC
  Administered 2014-05-25: 1 via ORAL
  Filled 2014-05-25 (×3): qty 1

## 2014-05-25 NOTE — Progress Notes (Signed)
[facility] PROGRESS NOTE  SHANEEN REESER SJG:[id number] DOB: 1932/05/21 DOA: 05/21/2014 PCP: No PCP Per Patient  Assessment/Plan: #1 right foot cellulitis Clinical improvement. Patient currently afebrile. Leukocytosis trending down. Continue oral Keflex.  #2 acute encephalopathy Likely secondary to Ativan. Decreased Ativan dose by half yesterday. Clinical improvement. CT of the head negative. MRI of the head negative. RPR nonreactive. HIV nonreactive. RBC folate is 970. B-12 levels at 390. Monitor follow.  #3 ESBL Escherichia coli UTI Will discontinue Bactrim. Place on fosfomycin.  #4 severe deficiency anemia H&H stable. Will need oral iron supplementation. Follow H&H. Outpatient follow-up.  #5 tibial plateau fracture nondisplaced Patient has been seen by orthopedics and recommended nonoperative conservative treatment. Knee immobilizer. Touchdown weightbearing right lower extremity. Outpatient follow-up.  #6 hypokalemia/Hypernatremia Magnesium level repleted. Potassium repleted. NSL IVF.  #7 hypertension Stable. Continue Cozaar.  #8 history of alcohol abuse Continue Ativan  CIWA withdrawal protocol, and decreased dose by 1/2. Thiamine. Multivitamin. Folic acid.  #9 GERD PPI  #10 Prophylaxis PPI for GI, Lovenox for DVT   Code Status: Full Family Communication: Updated patient and daughter at bedside. Disposition Plan: SNF when medically stable.   Consultants:  Orthopedics. Dr.Aluisio 05/21/2014  Procedures:  Chest x-ray 05/22/2014  X-ray of the right knee 05/21/2014  X-ray of the right foot 05/21/2014  CT head 05/23/14  MRI 05/24/14  Antibiotics:  IV clindamycin 05/21/2014>>>> 05/22/2014  IV Rocephin 05/21/2014 >>> 05/24/2014  Oral Keflex 05/24/2014  HPI/Subjective: Patient a little more alert today. Patient's speech is more comprehensible.  Objective: Filed Vitals:   05/25/14 0418  BP:   Pulse: 82  Temp: 98.1 F (36.7 C)  Resp: 18     Intake/Output Summary (Last 24 hours) at 05/25/14 1116 Last data filed at 05/25/14 0756  Gross per 24 hour  Intake 1541.3 ml  Output      0 ml  Net 1541.3 ml   Filed Weights   05/23/14 1100  Weight: 51.1 kg (112 lb 10.5 oz)    Exam:   General:  NAD. Asleep.  Cardiovascular: RRR  Respiratory: CTAB. No wheezing, no crackles, rhonchi  Abdomen: Soft, nontender, nondistended, positive bowel sounds.  Musculoskeletal: No clubbing cyanosis or edema. Right lower extremity knee mobilizer. Right ankle with some slight erythema and some slight warmth.  Data Reviewed: Basic Metabolic Panel:  Recent Labs Lab 05/21/14 1733 05/22/14 0510 05/23/14 0445 05/23/14 1449 05/24/14 0530 05/25/14 0505  NA  --  141 149* 146 142 137  K  --  3.5* 4.5 4.4 4.0 4.6  CL  --  102 110 109 105 100  CO2  --  20 19 21 24 22   GLUCOSE  --  182* 100* 102* 105* 97  BUN  --  14 18 16 13 9   CREATININE  --  1.24* 1.21* 1.05 0.95 0.74  CALCIUM  --  9.1 9.6 9.5 9.3 9.4  MG 1.5 1.3* 3.1*  --   --   --    Liver Function Tests: No results for input(s): AST, ALT, ALKPHOS, BILITOT, PROT, ALBUMIN in the last 168 hours. No results for input(s): LIPASE, AMYLASE in the last 168 hours. No results for input(s): AMMONIA in the last 168 hours. CBC:  Recent Labs Lab 05/21/14 1358 05/22/14 0510 05/23/14 0445 05/24/14 0530 05/25/14 0505  WBC 11.1* 11.6* 8.3 9.4 9.0  NEUTROABS 7.9*  --   --   --   --   HGB 9.7* 8.1* 8.5* 8.4* 8.9*  HCT 31.7* 26.8* 28.1* 26.9* 28.9*  MCV 71.2* 70.3* 71.0* 69.7* 69.6*  PLT 621* 488* 514* 585* 563*   Cardiac Enzymes: No results for input(s): CKTOTAL, CKMB, CKMBINDEX, TROPONINI in the last 168 hours. BNP (last 3 results) No results for input(s): PROBNP in the last 8760 hours. CBG: No results for input(s): GLUCAP in the last 168 hours.  Recent Results (from the past 240 hour(s))  Culture, Urine     Status: None   Collection Time: 05/22/14 10:11 AM  Result Value Ref  Range Status   Specimen Description URINE, RANDOM  Final   Special Requests NONE  Final   Culture  Setup Time   Final    05/22/2014 14:44 Performed at [place]   Final    50,000 COLONIES/ML Performed at [facility]    Culture   Final    ESCHERICHIA COLI Note: Confirmed Extended Spectrum Beta-Lactamase Producer (ESBL) Note: CRITICAL RESULT CALLED TO, READ BACK BY AND VERIFIED WITH: JAQUELINE 05/25/14 AT 0035 RIDK Performed at [facility]    Report Status 05/25/2014 FINAL  Final   Organism ID, Bacteria ESCHERICHIA COLI  Final      Susceptibility   Escherichia coli - MIC*    AMPICILLIN >=32 RESISTANT Resistant     CEFAZOLIN >=64 RESISTANT Resistant     CEFTRIAXONE >=64 RESISTANT Resistant     CIPROFLOXACIN >=4 RESISTANT Resistant     GENTAMICIN <=1 SENSITIVE Sensitive     LEVOFLOXACIN >=8 RESISTANT Resistant     NITROFURANTOIN <=16 SENSITIVE Sensitive     TOBRAMYCIN 8 INTERMEDIATE Intermediate     TRIMETH/SULFA <=20 SENSITIVE Sensitive     PIP/TAZO 8 SENSITIVE Sensitive     * ESCHERICHIA COLI     Studies: Ct Head Wo Contrast  05/23/2014   CLINICAL DATA:  Altered mental status, confusion  EXAM: CT HEAD WITHOUT CONTRAST  TECHNIQUE: Contiguous axial images were obtained from the base of the skull through the vertex without intravenous contrast.  COMPARISON:  09/06/2013  FINDINGS: Postsurgical changes are noted in the left frontal parietal region consistent with the patient's given clinical history. Mild atrophic changes are noted. No findings to suggest acute hemorrhage, acute infarction or space-occupying mass lesion are noted.  IMPRESSION: Chronic atrophic changes without acute abnormality   Electronically Signed   By: Inez Catalina M.D.   On: 05/23/2014 19:30   Mr Brain Wo Contrast  05/24/2014   CLINICAL DATA:  80 year old female with extreme confusion. Altered mental status. Initial encounter.  EXAM: MRI HEAD WITHOUT CONTRAST   TECHNIQUE: Multiplanar, multiecho pulse sequences of the brain and surrounding structures were obtained without intravenous contrast.  COMPARISON:  Head CTs without contrast 05/23/2014 and earlier.  FINDINGS: The examination had to be discontinued prior to completion due to patient altered mental status, agitation.  Axial diffusion-weighted imaging, sagittal T1 weighted imaging, and axial T2 weighted imaging is obtained. Some of the imaging is degraded by motion despite repeated attempts.  Sequelae of previous left craniectomy. Stable cerebral volume. No midline shift, mass effect, or evidence of intracranial mass lesion. No ventriculomegaly. Major intracranial vascular flow voids are grossly preserved. No restricted diffusion or evidence of acute infarction. Moderate for age nonspecific cerebral white matter signal changes.  Chronic left lamina papyracea fracture. Mild right mastoid effusion. No acute orbit or scalp soft tissue findings. Visualized bone marrow signal is within normal limits.  IMPRESSION: No acute intracranial abnormality is identified.  Truncated exam due to patient altered mental status, agitation.   Electronically  Signed   By: Lars Pinks M.D.   On: 05/24/2014 13:30    Scheduled Meds: . enoxaparin (LOVENOX) injection  40 mg Subcutaneous Q24H  . feeding supplement (ENSURE COMPLETE)  237 mL Oral BID BM  . folic acid  1 mg Oral Daily  . fosfomycin  3 g Oral QODAY  . LORazepam  0-4 mg Intravenous Q12H  . losartan  50 mg Oral Daily  . mirtazapine  15 mg Oral QHS  . multivitamin with minerals  1 tablet Oral Daily  . pantoprazole  40 mg Oral Daily  . potassium chloride SA  40 mEq Oral Daily  . pseudoephedrine  30 mg Oral BID  . senna  1 tablet Oral BID  . thiamine  100 mg Oral Daily   Continuous Infusions: . dextrose 75 mL/hr at 05/24/14 2108    Principal Problem:   Cellulitis of right foot Active Problems:   Anemia   Hypokalemia   Essential hypertension   GERD  (gastroesophageal reflux disease)   Alcoholism   Fracture of right tibial plateau   UTI (urinary tract infection)   Protein-calorie malnutrition, severe   Hypernatremia   Acute encephalopathy    Time spent: 46 minutes    THOMPSON,DANIEL M.D. [facility] Pager [phone](. If 7PM-7AM, please contact night-coverage at [email or web address], password [facility] 05/25/2014, 11:16 AM  LOS: 4 days

## 2014-05-25 NOTE — Progress Notes (Signed)
Received microbiology report on urine culture from [facility] at approximately 0035 re: E-Coli 50,000 colonies/ml and + ESBL. NP Rogue Bussing was informed and patient was placed on Bactrim DS with first dose given at 0130.

## 2014-05-26 LAB — CBC
HEMATOCRIT: 28.1 % — AB (ref 36.0–46.0)
HEMOGLOBIN: 8.7 g/dL — AB (ref 12.0–15.0)
MCH: 21.3 pg — AB (ref 26.0–34.0)
MCHC: 31 g/dL (ref 30.0–36.0)
MCV: 68.9 fL — AB (ref 78.0–100.0)
Platelets: 589 10*3/uL — ABNORMAL HIGH (ref 150–400)
RBC: 4.08 MIL/uL (ref 3.87–5.11)
RDW: 16.3 % — AB (ref 11.5–15.5)
WBC: 8.4 10*3/uL (ref 4.0–10.5)

## 2014-05-26 LAB — BASIC METABOLIC PANEL
ANION GAP: 14 (ref 5–15)
BUN: 5 mg/dL — AB (ref 6–23)
CALCIUM: 9.2 mg/dL (ref 8.4–10.5)
CO2: 23 meq/L (ref 19–32)
Chloride: 100 mEq/L (ref 96–112)
Creatinine, Ser: 0.89 mg/dL (ref 0.50–1.10)
GFR calc Af Amer: 69 mL/min — ABNORMAL LOW (ref >90)
GFR calc non Af Amer: 60 mL/min — ABNORMAL LOW (ref >90)
Glucose, Bld: 112 mg/dL — ABNORMAL HIGH (ref 70–99)
Potassium: 3.5 mEq/L — ABNORMAL LOW (ref 3.7–5.3)
Sodium: 137 mEq/L (ref 137–147)

## 2014-05-26 LAB — CLOSTRIDIUM DIFFICILE BY PCR: Toxigenic C. Difficile by PCR: NEGATIVE

## 2014-05-26 MED ORDER — LOPERAMIDE HCL 2 MG PO CAPS: 2.0000 mg | ORAL_CAPSULE | ORAL | Status: DC | PRN

## 2014-05-26 NOTE — Progress Notes (Signed)
[facility] PROGRESS NOTE  Laura May QJJ:[id number] DOB: 04/03/1932 DOA: 05/21/2014 PCP: No PCP Per Patient  Assessment/Plan: #1 right foot cellulitis Clinical improvement. Patient currently afebrile. Leukocytosis trending down. Continue oral Keflex D5/7.  #2 acute encephalopathy Likely secondary to Ativan. Clinical improvement. Decreased Ativan dose by half. CT of the head negative. MRI of the head negative. RPR nonreactive. HIV nonreactive. RBC folate is 970. B-12 levels at 390. Monitor follow.  #3 ESBL Escherichia coli UTI Continue fosfomycin.  #4 severe deficiency anemia H&H stable. Will need oral iron supplementation. Follow H&H. Outpatient follow-up.  #5 tibial plateau fracture nondisplaced Patient has been seen by orthopedics and recommended nonoperative conservative treatment. Knee immobilizer. Touchdown weightbearing right lower extremity. Outpatient follow-up.  #6 hypokalemia/Hypernatremia Magnesium level repleted. Potassium repleted. NSL IVF.  #7 hypertension Stable. Continue Cozaar.  #8 history of alcohol abuse Continue Ativan  CIWA withdrawal protocol, at decreased dose by 1/2. Thiamine. Multivitamin. Folic acid.  #9 GERD PPI  #10 Prophylaxis PPI for GI, Lovenox for DVT   Code Status: Full Family Communication: Updated patient and daughter at bedside. Disposition Plan: SNF when medically stable, hopefully tomorrow.   Consultants:  Orthopedics. Dr.Aluisio 05/21/2014  Procedures:  Chest x-ray 05/22/2014  X-ray of the right knee 05/21/2014  X-ray of the right foot 05/21/2014  CT head 05/23/14  MRI 05/24/14  Antibiotics:  IV clindamycin 05/21/2014>>>> 05/22/2014  IV Rocephin 05/21/2014 >>> 05/24/2014  Oral Keflex 05/24/2014  HPI/Subjective: Patient alert today. Patient's speech is more comprehensible.  Objective: Filed Vitals:   05/26/14 1335  BP: 137/72  Pulse: 110  Temp: 98 F (36.7 C)  Resp: 18    Intake/Output  Summary (Last 24 hours) at 05/26/14 1624 Last data filed at 05/26/14 0434  Gross per 24 hour  Intake    240 ml  Output      0 ml  Net    240 ml   Filed Weights   05/23/14 1100 05/26/14 0405  Weight: 51.1 kg (112 lb 10.5 oz) 50.667 kg (111 lb 11.2 oz)    Exam:   General:  NAD. Asleep.  Cardiovascular: RRR  Respiratory: CTAB. No wheezing, no crackles, rhonchi  Abdomen: Soft, nontender, nondistended, positive bowel sounds.  Musculoskeletal: No clubbing cyanosis or edema. Right lower extremity knee mobilizer. Right ankle with minimal erythema and warmth.  Data Reviewed: Basic Metabolic Panel:  Recent Labs Lab 05/21/14 1733 05/22/14 0510 05/23/14 0445 05/23/14 1449 05/24/14 0530 05/25/14 0505 05/26/14 0440  NA  --  141 149* 146 142 137 137  K  --  3.5* 4.5 4.4 4.0 4.6 3.5*  CL  --  102 110 109 105 100 100  CO2  --  20 19 21 24 22 23   GLUCOSE  --  182* 100* 102* 105* 97 112*  BUN  --  14 18 16 13 9  5*  CREATININE  --  1.24* 1.21* 1.05 0.95 0.74 0.89  CALCIUM  --  9.1 9.6 9.5 9.3 9.4 9.2  MG 1.5 1.3* 3.1*  --   --   --   --    Liver Function Tests: No results for input(s): AST, ALT, ALKPHOS, BILITOT, PROT, ALBUMIN in the last 168 hours. No results for input(s): LIPASE, AMYLASE in the last 168 hours. No results for input(s): AMMONIA in the last 168 hours. CBC:  Recent Labs Lab 05/21/14 1358 05/22/14 0510 05/23/14 0445 05/24/14 0530 05/25/14 0505 05/26/14 0440  WBC 11.1* 11.6* 8.3 9.4 9.0 8.4  NEUTROABS 7.9*  --   --   --   --   --  HGB 9.7* 8.1* 8.5* 8.4* 8.9* 8.7*  HCT 31.7* 26.8* 28.1* 26.9* 28.9* 28.1*  MCV 71.2* 70.3* 71.0* 69.7* 69.6* 68.9*  PLT 621* 488* 514* 585* 563* 589*   Cardiac Enzymes: No results for input(s): CKTOTAL, CKMB, CKMBINDEX, TROPONINI in the last 168 hours. BNP (last 3 results) No results for input(s): PROBNP in the last 8760 hours. CBG: No results for input(s): GLUCAP in the last 168 hours.  Recent Results (from the past 240  hour(s))  Culture, Urine     Status: None   Collection Time: 05/22/14 10:11 AM  Result Value Ref Range Status   Specimen Description URINE, RANDOM  Final   Special Requests NONE  Final   Culture  Setup Time   Final    05/22/2014 14:44 Performed at [place]   Final    50,000 COLONIES/ML Performed at [facility]    Culture   Final    ESCHERICHIA COLI Note: Confirmed Extended Spectrum Beta-Lactamase Producer (ESBL) Note: CRITICAL RESULT CALLED TO, READ BACK BY AND VERIFIED WITH: JAQUELINE 05/25/14 AT 0035 RIDK Performed at [facility]    Report Status 05/25/2014 FINAL  Final   Organism ID, Bacteria ESCHERICHIA COLI  Final      Susceptibility   Escherichia coli - MIC*    AMPICILLIN >=32 RESISTANT Resistant     CEFAZOLIN >=64 RESISTANT Resistant     CEFTRIAXONE >=64 RESISTANT Resistant     CIPROFLOXACIN >=4 RESISTANT Resistant     GENTAMICIN <=1 SENSITIVE Sensitive     LEVOFLOXACIN >=8 RESISTANT Resistant     NITROFURANTOIN <=16 SENSITIVE Sensitive     TOBRAMYCIN 8 INTERMEDIATE Intermediate     TRIMETH/SULFA <=20 SENSITIVE Sensitive     PIP/TAZO 8 SENSITIVE Sensitive     * ESCHERICHIA COLI  Clostridium Difficile by PCR     Status: None   Collection Time: 05/25/14  4:58 PM  Result Value Ref Range Status   C difficile by pcr NEGATIVE NEGATIVE Final    Comment: Performed at [facility]     Studies: No results found.  Scheduled Meds: . enoxaparin (LOVENOX) injection  40 mg Subcutaneous Q24H  . feeding supplement (ENSURE COMPLETE)  237 mL Oral BID BM  . folic acid  1 mg Oral Daily  . fosfomycin  3 g Oral QODAY  . LORazepam  0-4 mg Intravenous Q12H  . losartan  50 mg Oral Daily  . mirtazapine  15 mg Oral QHS  . multivitamin with minerals  1 tablet Oral Daily  . pantoprazole  40 mg Oral Daily  . potassium chloride SA  40 mEq Oral Daily  . pseudoephedrine  30 mg Oral BID  . senna  1 tablet Oral BID  . thiamine  100 mg  Oral Daily   Continuous Infusions:    Principal Problem:   Cellulitis of right foot Active Problems:   Anemia   Hypokalemia   Essential hypertension   GERD (gastroesophageal reflux disease)   Alcoholism   Fracture of right tibial plateau   UTI (urinary tract infection)   Protein-calorie malnutrition, severe   Hypernatremia   Acute encephalopathy    Time spent: 16 minutes    Saadiya Wilfong M.D. [facility] Pager [phone](. If 7PM-7AM, please contact night-coverage at [email or web address], password [facility] 05/26/2014, 4:24 PM  LOS: 5 days

## 2014-05-27 LAB — CBC
HCT: 28.8 % — ABNORMAL LOW (ref 36.0–46.0)
Hemoglobin: 9.1 g/dL — ABNORMAL LOW (ref 12.0–15.0)
MCH: 21.7 pg — ABNORMAL LOW (ref 26.0–34.0)
MCHC: 31.6 g/dL (ref 30.0–36.0)
MCV: 68.6 fL — ABNORMAL LOW (ref 78.0–100.0)
PLATELETS: 623 10*3/uL — AB (ref 150–400)
RBC: 4.2 MIL/uL (ref 3.87–5.11)
RDW: 16.6 % — AB (ref 11.5–15.5)
WBC: 10.4 10*3/uL (ref 4.0–10.5)

## 2014-05-27 LAB — BASIC METABOLIC PANEL
Anion gap: 17 — ABNORMAL HIGH (ref 5–15)
BUN: 6 mg/dL (ref 6–23)
CALCIUM: 9.6 mg/dL (ref 8.4–10.5)
CO2: 20 mEq/L (ref 19–32)
CREATININE: 0.92 mg/dL (ref 0.50–1.10)
Chloride: 101 mEq/L (ref 96–112)
GFR, EST AFRICAN AMERICAN: 66 mL/min — AB (ref >90)
GFR, EST NON AFRICAN AMERICAN: 57 mL/min — AB (ref >90)
GLUCOSE: 97 mg/dL (ref 70–99)
POTASSIUM: 4.6 meq/L (ref 3.7–5.3)
Sodium: 138 mEq/L (ref 137–147)

## 2014-05-27 MED ORDER — CEPHALEXIN 500 MG PO CAPS: 500.0000 mg | ORAL_CAPSULE | Freq: Three times a day (TID) | ORAL | Status: DC

## 2014-05-27 MED ORDER — FOLIC ACID 1 MG PO TABS: 1.0000 mg | ORAL_TABLET | Freq: Every day | ORAL | Status: DC

## 2014-05-27 MED ORDER — PSEUDOEPHEDRINE HCL 30 MG/5ML PO SYRP: 30.0000 mg | ORAL_SOLUTION | Freq: Two times a day (BID) | ORAL | Status: DC

## 2014-05-27 MED ORDER — FOSFOMYCIN TROMETHAMINE 3 G PO PACK: 3.0000 g | PACK | ORAL | Status: DC

## 2014-05-27 MED ORDER — TRAMADOL HCL 50 MG PO TABS: 50.0000 mg | ORAL_TABLET | Freq: Four times a day (QID) | ORAL | Status: DC | PRN

## 2014-05-27 MED ORDER — HYDROCODONE-ACETAMINOPHEN 5-325 MG PO TABS: 1.0000 | ORAL_TABLET | ORAL | Status: DC | PRN

## 2014-05-27 MED ORDER — ADULT MULTIVITAMIN W/MINERALS CH: 1.0000 | ORAL_TABLET | Freq: Every day | ORAL | Status: DC

## 2014-05-27 MED ORDER — THIAMINE HCL 100 MG PO TABS
100.0000 mg | ORAL_TABLET | Freq: Every day | ORAL | Status: DC
Start: 2014-05-27 — End: 2014-06-25

## 2014-05-27 MED ORDER — SENNA 8.6 MG PO TABS: 1.0000 | ORAL_TABLET | Freq: Two times a day (BID) | ORAL | Status: DC

## 2014-05-27 MED ORDER — FERROUS SULFATE 300 (60 FE) MG/5ML PO SYRP: 300.0000 mg | ORAL_SOLUTION | Freq: Three times a day (TID) | ORAL | Status: DC

## 2014-05-27 MED ORDER — ENSURE COMPLETE PO LIQD: 237.0000 mL | Freq: Two times a day (BID) | ORAL | Status: AC

## 2014-05-27 MED ORDER — ENOXAPARIN SODIUM 40 MG/0.4ML ~~LOC~~ SOLN: 40.0000 mg | SUBCUTANEOUS | Status: DC

## 2014-05-27 NOTE — Progress Notes (Signed)
CSW continuing to follow for disposition planning.   Per MD, pt medically ready for discharge today.   CSW contacted [facility] and updated facility and facility agreeable to accepting pt today.  CSW notified pt daughter via telephone.   CSW facilitated pt discharge needs including contacting facility, faxing pt discharge information via [facility], discussing with pt at bedside and pt daughter, Vaughan Basta via telephone, providing RN phone number to call report, and arranging ambulance transport via [place] for pt to [facility].  Pt daughter appreciative of assistance and plans to meet pt at facility.   No further social work needs identified at this time.  CSW signing off.   Alison Murray, MSW, [place] Work [phone]

## 2014-05-27 NOTE — Progress Notes (Signed)
Speech Language Pathology Treatment: Dysphagia  Patient Details Name: Laura May MRN: [id number] DOB: 1931-09-24 Today's Date: 05/27/2014 Time: 9030-0923 SLP Time Calculation (min) (ACUTE ONLY): 20 min  Assessment / Plan / Recommendation Clinical Impression  Pt with much improved tolerance of po intake.   Pt reports desire to have diet advanced to regular/thin.  She is edentulous and states she is "not trying to find a man" when asked if she uses dentures.  Observed pt self feeding graham cracker, icecream, Ensure and tea with adequate "mastication" and timely swallow.  Pt admits to premorbid dysphagia to pills stating they lodge pointing to proximal esophagus.  Advised pt to alternative means to take medications including with applesauce - crushed if not contranindicated.    As pt is aware of her mild dysphagia, recommend to advance diet to regular/thin to allow her to order appropriate foods.    SLP to sign off as all goals met.        HPI HPI: 80 year old female with history of hypertension, GERD, active alcohol use brought to the hospital with severe pain in her right knee and foot. Patient had a fall in her shower about a week back and landed on her right knee and her wrist at that time. She came to the ED due to pain and tenderness in her wrist and had an x-ray which was negative for fracture. Patient was discharged home with some pain medications and felt fine however for the past few days she has noticed increase redness and swelling in her right foot along with swelling in her right knee being unable to be at any weight. She denies any pain in her hip. She reports drinking about a pint of liquor every day and her last drink was 4 days back. She denies any shakes or tremors. She denies passing out and she had the fall. Pt underwent BSE last week with recommendation for pureed/thin.  Today pt seen to determine readiness for dietary advancement.     Pertinent Vitals Pain Assessment:  Faces Faces Pain Scale: Hurts little more Pain Location: right shoulder Pain Descriptors / Indicators: Grimacing Pain Intervention(s): Limited activity within patient's tolerance (pt self feeding with care)  SLP Plan  All goals met    Recommendations Medication Administration: Crushed with puree Supervision: Staff to assist with self feeding;Full supervision/cueing for compensatory strategies Compensations: Slow rate;Small sips/bites;Check for pocketing;Follow solids with liquid Postural Changes and/or Swallow Maneuvers: Seated upright 90 degrees              Oral Care Recommendations: Oral care BID Follow up Recommendations: None Plan: All goals met    [place], [place] [facility] SLP [phone]

## 2014-05-27 NOTE — Discharge Summary (Signed)
Physician Discharge Summary  Laura May LPF:[id number] DOB: Sep 22, 1931 DOA: 05/21/2014  PCP: No PCP Per Patient  Admit date: 05/21/2014 Discharge date: 05/27/2014  Time spent: 65 minutes  Recommendations for Outpatient Follow-up:  1. Follow-up with Dr. Wynelle Link, in 2 weeks to follow-up on tibial plateau fracture. 2. Follow-up with M.D. at skilled nursing facility.  Discharge Diagnoses:  Principal Problem:   Cellulitis of right foot Active Problems:   Anemia   Hypokalemia   Essential hypertension   GERD (gastroesophageal reflux disease)   Alcoholism   Fracture of right tibial plateau   UTI (urinary tract infection)   Protein-calorie malnutrition, severe   Hypernatremia   Acute encephalopathy   Discharge Condition: Stable and improved  Diet recommendation: Regular  Filed Weights   05/26/14 0405 05/26/14 2000 05/27/14 0410  Weight: 50.667 kg (111 lb 11.2 oz) 47.3 kg (104 lb 4.4 oz) 47.174 kg (104 lb)    History of present illness:  80 year old female with history of hypertension, GERD, active alcohol use brought to the hospital with severe pain in her right knee and foot. Patient had a fall in her shower about a week prior to admission and landed on her right knee and her wrist at that time. She came to the ED due to pain and tenderness in her wrist and had an x-ray which was negative for fracture. Patient was discharged home with some pain medications and felt fine however for the past few days prior to admission, she had noticed increased redness and swelling in her right foot along with swelling in her right knee being unable to be at any weight. She denied any pain in her hip. She reportedly drinking about a pint of liquor every day and her last drink was 4 days prior to admission. She denied any shakes or tremors. She denied passing out and she had the fall.  Patient denied headache, dizziness, fever, chills, nausea , vomiting, chest pain, palpitations, SOB, abdominal  pain, bowel or urinary symptoms. Denied change in weight or appetite.  Course in the ED  Patient on exam was found to have cellulitis of the right foot. Her blood pressure was mildly elevated. Blood work showed WC 11.1. Hemoglobin of 9.7, hematocrit of 31.7, platelets of 621, potassium of 2.6, normal renal function. EKG showed PACs with borderline QTC prolongation. X-ray of the right foot was negative for any abnormality. An x-ray of the right knee showed fracture of the lateral aspect of the medial tibial plateau with moderate joint effusion. Also showed multilevel osteoarthritic changes.  Patient given IV clindamycin and Hospitalist admission requested. Orthopedic surgery consulted by ED physician. (Dr. Maureen Ralphs saw the patient.)   Hospital Course:  #1 right foot cellulitis On admission patient was noted to have a right foot cellulitis. Patient was initially started on clindamycin which was subsequently switched to IV Rocephin to cover for both cellulitis and UTI. Patient improved clinically and IV Rocephin was subsequently transitioned to oral Keflex. Patient be discharged on 3 more days of oral Keflex to complete a one-week course of antibiotic therapy. Outpatient follow-up.   #2 acute encephalopathy During the hospitalization patient became lethargic and confused while on Ativan withdrawal protocol. Patient also was noted to have some slurred speech. CT of the head was done which was negative. MRI of the head was also done which was negative. RPR was nonreactive. RBC folate was 970. B-12 levels was at 390. Ativan dose was decreased to half its dose and asked patient completed their withdrawal protocol  patient improved clinically and was back to her baseline by day of discharge.   #3 ESBL Escherichia coli UTI During the hospitalization urinalysis which was done was consistent with a UTI. Patient was placed initially on IV Rocephin. Urine cultures grew out extended spectrum beta lactamase  Escherichia coli. Patient was subsequently transitioned to oral fosfomycin and will need 1 more dose on 05/29/2014 to complete a course of antibiotic therapy.   #4 severe deficiency anemia Patient noted to be anemic during the hospitalization. Anemia panel was consistent with iron deficiency anemia. Patient's hemoglobin remained stable. Patient be discharged on oral iron supplementation. Outpatient follow-up.  #5 tibial plateau fracture nondisplaced Patient has been seen by orthopedics and recommended nonoperative conservative treatment. Patient was placed in a Knee immobilizer. Touchdown weightbearing right lower extremity. Outpatient follow-up.  #6 hypokalemia/Hypernatremia Magnesium level repleted. Potassium repleted. Patient was found to be hyponatremic was placed on D5 W with resolution of her hyponatremia. NSL IVF.  #7 hypertension Stable. Continued on home regimen of Cozaar.  #8 history of alcohol abuse Patient was noted to have a history of alcohol abuse. Patient was initially placed on the Ativan  CIWA withdrawal protocol and placed on thiamine, multivitamin, folic acid. Patient became very lethargic with some dysarthric speech which was felt to be secondary to Ativan. Ativan dose was decreased by half an as patient was treated for the protocol she improved clinically and was back to her baseline by day of discharge. Patient be discharged to a skilled nursing facility with thiamine, multivitamin, folic acid. Patient completed the Ativan withdrawal protocol.   #9 GERD Patient was maintained on a PPI throughout the hospitalization.   Procedures:  Chest x-ray 05/22/2014  X-ray of the right knee 05/21/2014  X-ray of the right foot 05/21/2014  CT head 05/23/14  MRI 05/24/14  Consultations:  Orthopedics. Dr.Aluisio 05/21/2014  Discharge Exam: Laura May Vitals:   05/27/14 1348  BP: 130/55  Pulse: 110  Temp: 99.3 F (37.4 C)  Resp: 18    General: NAD Cardiovascular:  RRR Respiratory: CTAB  Discharge Instructions   Discharge Instructions    Diet general    Complete by:  As directed      Discharge instructions    Complete by:  As directed   Follow up with MD at SNF. Follow up with Dr Wynelle Link in 2-3 weeks     Increase activity slowly    Complete by:  As directed      Touch down weight bearing    Complete by:  As directed   Laterality:  right  Extremity:  Lower          Current Discharge Medication List    START taking these medications   Details  cephALEXin (KEFLEX) 500 MG capsule Take 1 capsule (500 mg total) by mouth 3 (three) times daily. Take for 3 days then stop. Qty: 9 capsule, Refills: 0    enoxaparin (LOVENOX) 40 MG/0.4ML injection Inject 0.4 mLs (40 mg total) into the skin daily. Qty: 0 Syringe    feeding supplement, ENSURE COMPLETE, (ENSURE COMPLETE) LIQD Take 237 mLs by mouth 2 (two) times daily between meals.    ferrous sulfate 300 (60 FE) MG/5ML syrup Take 5 mLs (300 mg total) by mouth 3 (three) times daily with meals. Qty: 150 mL, Refills: 0    folic acid (FOLVITE) 1 MG tablet Take 1 tablet (1 mg total) by mouth daily.    fosfomycin (MONUROL) 3 G PACK Take 3 g by mouth every other day.  Take on 05/29/14, then stop. Qty: 3 g, Refills: 0    HYDROcodone-acetaminophen (NORCO/VICODIN) 5-325 MG per tablet Take 1-2 tablets by mouth every 4 (four) hours as needed for moderate pain. Qty: 20 tablet, Refills: 0    Multiple Vitamin (MULTIVITAMIN WITH MINERALS) TABS tablet Take 1 tablet by mouth daily.    pseudoephedrine (SUDAFED) 30 MG/5ML syrup Take 5 mLs (30 mg total) by mouth 2 (two) times daily. Take for 4 days then stop. Qty: 118 mL, Refills: 0    senna (SENOKOT) 8.6 MG TABS tablet Take 1 tablet (8.6 mg total) by mouth 2 (two) times daily. Qty: 120 each, Refills: 0    thiamine 100 MG tablet Take 1 tablet (100 mg total) by mouth daily.      CONTINUE these medications which have CHANGED   Details  traMADol (ULTRAM) 50 MG  tablet Take 1 tablet (50 mg total) by mouth every 6 (six) hours as needed. Qty: 15 tablet, Refills: 0      CONTINUE these medications which have NOT CHANGED   Details  acetaminophen (TYLENOL) 500 MG tablet Take 500 mg by mouth daily as needed for moderate pain.     losartan (COZAAR) 50 MG tablet Take 1 tablet (50 mg total) by mouth daily. Qty: 90 tablet, Refills: 3    pantoprazole (PROTONIX) 40 MG tablet Take 1 tablet (40 mg total) by mouth daily. Qty: 90 tablet, Refills: 1    docusate sodium (COLACE) 100 MG capsule Take 100 mg by mouth daily as needed for mild constipation.    magnesium hydroxide (MILK OF MAGNESIA) 400 MG/5ML suspension Take by mouth daily as needed for mild constipation.    meloxicam (MOBIC) 15 MG tablet TAKE ONE TABLET BY MOUTH ONCE DAILY. Qty: 30 tablet, Refills: 2    mirtazapine (REMERON) 15 MG tablet Take 1 tablet (15 mg total) by mouth at bedtime. Qty: 30 tablet, Refills: 3    potassium chloride SA (K-DUR,KLOR-CON) 20 MEQ tablet Take 1 tablet (20 mEq total) by mouth daily. Qty: 90 tablet, Refills: 3       Allergies  Allergen Reactions  . Fexofenadine Other (See Comments)    Unknown reaction   Follow-up Information    Follow up with Gearlean Alf, MD. Schedule an appointment as soon as possible for a visit in 2 weeks.   Specialty:  Orthopedic Surgery   Why:  Call office at [phone] to set up appointment with Dr. Wynelle Link in 2-3 weeks for follow up x-rays in the office.   Contact information:   [address] [place] [address] [phone]       Please follow up.   Why:  f/u with MD at SNF       The results of significant diagnostics from this hospitalization (including imaging, microbiology, ancillary and laboratory) are listed below for reference.    Significant Diagnostic Studies: Ct Head Wo Contrast  05/23/2014   CLINICAL DATA:  Altered mental status, confusion  EXAM: CT HEAD WITHOUT CONTRAST  TECHNIQUE: Contiguous  axial images were obtained from the base of the skull through the vertex without intravenous contrast.  COMPARISON:  09/06/2013  FINDINGS: Postsurgical changes are noted in the left frontal parietal region consistent with the patient's given clinical history. Mild atrophic changes are noted. No findings to suggest acute hemorrhage, acute infarction or space-occupying mass lesion are noted.  IMPRESSION: Chronic atrophic changes without acute abnormality   Electronically Signed   By: Inez Catalina M.D.   On: 05/23/2014 19:30  Mr Brain Wo Contrast  05/24/2014   CLINICAL DATA:  80 year old female with extreme confusion. Altered mental status. Initial encounter.  EXAM: MRI HEAD WITHOUT CONTRAST  TECHNIQUE: Multiplanar, multiecho pulse sequences of the brain and surrounding structures were obtained without intravenous contrast.  COMPARISON:  Head CTs without contrast 05/23/2014 and earlier.  FINDINGS: The examination had to be discontinued prior to completion due to patient altered mental status, agitation.  Axial diffusion-weighted imaging, sagittal T1 weighted imaging, and axial T2 weighted imaging is obtained. Some of the imaging is degraded by motion despite repeated attempts.  Sequelae of previous left craniectomy. Stable cerebral volume. No midline shift, mass effect, or evidence of intracranial mass lesion. No ventriculomegaly. Major intracranial vascular flow voids are grossly preserved. No restricted diffusion or evidence of acute infarction. Moderate for age nonspecific cerebral white matter signal changes.  Chronic left lamina papyracea fracture. Mild right mastoid effusion. No acute orbit or scalp soft tissue findings. Visualized bone marrow signal is within normal limits.  IMPRESSION: No acute intracranial abnormality is identified.  Truncated exam due to patient altered mental status, agitation.   Electronically Signed   By: Lars Pinks M.D.   On: 05/24/2014 13:30   Dg Chest Port 1 View  05/22/2014    CLINICAL DATA:  Increasing shortness of breath with hypoxia  EXAM: PORTABLE CHEST - 1 VIEW  COMPARISON:  None.  FINDINGS: No cardiomegaly. Negative aortic and hilar contours for age. There is interstitial coarsening but no edema or pneumonia. Equivocal, trace right pleural effusion. No pneumothorax.  IMPRESSION: No edema or pneumonia.   Electronically Signed   By: Jorje Guild M.D.   On: 05/22/2014 05:38   Dg Knee Complete 4 Views Right  05/21/2014   CLINICAL DATA:  Fall 2 days prior in shower.  Pain and swelling  EXAM: RIGHT KNEE - COMPLETE 4+ VIEW  COMPARISON:  None.  FINDINGS: Frontal, lateral, and bilateral oblique views were obtained. There is a moderate joint effusion. There is an apparent fracture along the lateral aspect of the medial tibial plateau, apparent only on the frontal view. This fracture does not appear to be significantly displaced on current study. No other fracture apparent. No dislocation. There is moderate narrowing medially and in the patellofemoral joint region. There is spurring medially and in the patellofemoral joint region. There are foci of meniscal calcification. Calcification is noted in the medial aspect of the joint which probably represents residua of old trauma. There are foci of arterial vascular calcification.  IMPRESSION: Evidence of a fracture of the lateral aspect of the medial tibial plateau. Moderate joint effusion. Multilevel osteoarthritic change. Meniscal calcification may be seen with osteoarthritis but also may be indicative of calcium pyrophosphate deposition disease which may present clinically as pseudogout. There is atherosclerotic calcification present.   Electronically Signed   By: Lowella Grip M.D.   On: 05/21/2014 14:48   Dg Hand Complete Right  05/16/2014   CLINICAL DATA:  RIGHT hand pain, fell in tub on a bar soap 2 days ago, hand redness and swelling, pain at thumb and wrist  EXAM: RIGHT HAND - COMPLETE 3+ VIEW  COMPARISON:  09/29/2013   FINDINGS: Osseous demineralization.  Scattered narrowing of interphalangeal joints.  Additional narrowing at third and fourth MCP joints.  Angular calcific density is seen at the base of the proximal phalanx of the thumb, seen only on single view, but unchanged since 09/29/2013.  No definite acute fracture, dislocation or bone destruction.  Chondrocalcinosis at the triangular fibrocartilage  complex and at dorsum of carpus.  Scattered atherosclerotic calcifications.  IMPRESSION: No definite acute bony abnormalities.  Scattered degenerative changes with question CPPD.   Electronically Signed   By: Lavonia Dana M.D.   On: 05/16/2014 00:15   Dg Foot Complete Right  05/21/2014   CLINICAL DATA:  Status post fall 1 week ago with right foot pain. Initial encounter. Difficulty walking.  EXAM: RIGHT FOOT COMPLETE - 3+ VIEW  COMPARISON:  None.  FINDINGS: No acute bony or joint abnormality is identified. Notable degenerative change is seen. First MTP osteoarthritis is noted. Vascular calcifications are identified.  IMPRESSION: No acute abnormality or finding to explain the patient's symptoms.   Electronically Signed   By: Inge Rise M.D.   On: 05/21/2014 13:38    Microbiology: Recent Results (from the past 240 hour(s))  Culture, Urine     Status: None   Collection Time: 05/22/14 10:11 AM  Result Value Ref Range Status   Specimen Description URINE, RANDOM  Final   Special Requests NONE  Final   Culture  Setup Time   Final    05/22/2014 14:44 Performed at [place] [facility]   Final    50,000 COLONIES/ML Performed at [facility]    Culture   Final    ESCHERICHIA COLI Note: Confirmed Extended Spectrum Beta-Lactamase Producer (ESBL) Note: CRITICAL RESULT CALLED TO, READ BACK BY AND VERIFIED WITH: JAQUELINE 05/25/14 AT 0035 RIDK Performed at [facility]    Report Status 05/25/2014 FINAL  Final   Organism ID, Bacteria ESCHERICHIA COLI  Final      Susceptibility    Escherichia coli - MIC*    AMPICILLIN >=32 RESISTANT Resistant     CEFAZOLIN >=64 RESISTANT Resistant     CEFTRIAXONE >=64 RESISTANT Resistant     CIPROFLOXACIN >=4 RESISTANT Resistant     GENTAMICIN <=1 SENSITIVE Sensitive     LEVOFLOXACIN >=8 RESISTANT Resistant     NITROFURANTOIN <=16 SENSITIVE Sensitive     TOBRAMYCIN 8 INTERMEDIATE Intermediate     TRIMETH/SULFA <=20 SENSITIVE Sensitive     PIP/TAZO 8 SENSITIVE Sensitive     * ESCHERICHIA COLI  Clostridium Difficile by PCR     Status: None   Collection Time: 05/25/14  4:58 PM  Result Value Ref Range Status   C difficile by pcr NEGATIVE NEGATIVE Final    Comment: Performed at [place]: Basic Metabolic Panel:  Recent Labs Lab 05/21/14 1733 05/22/14 0510 05/23/14 0445 05/23/14 1449 05/24/14 0530 05/25/14 0505 05/26/14 0440 05/27/14 0401  NA  --  141 149* 146 142 137 137 138  K  --  3.5* 4.5 4.4 4.0 4.6 3.5* 4.6  CL  --  102 110 109 105 100 100 101  CO2  --  20 19 21 24 22 23 20   GLUCOSE  --  182* 100* 102* 105* 97 112* 97  BUN  --  14 18 16 13 9  5* 6  CREATININE  --  1.24* 1.21* 1.05 0.95 0.74 0.89 0.92  CALCIUM  --  9.1 9.6 9.5 9.3 9.4 9.2 9.6  MG 1.5 1.3* 3.1*  --   --   --   --   --    Liver Function Tests: No results for input(s): AST, ALT, ALKPHOS, BILITOT, PROT, ALBUMIN in the last 168 hours. No results for input(s): LIPASE, AMYLASE in the last 168 hours. No results for input(s): AMMONIA in the last 168  hours. CBC:  Recent Labs Lab 05/21/14 1358  05/23/14 0445 05/24/14 0530 05/25/14 0505 05/26/14 0440 05/27/14 0401  WBC 11.1*  < > 8.3 9.4 9.0 8.4 10.4  NEUTROABS 7.9*  --   --   --   --   --   --   HGB 9.7*  < > 8.5* 8.4* 8.9* 8.7* 9.1*  HCT 31.7*  < > 28.1* 26.9* 28.9* 28.1* 28.8*  MCV 71.2*  < > 71.0* 69.7* 69.6* 68.9* 68.6*  PLT 621*  < > 514* 585* 563* 589* 623*  < > = values in this interval not displayed. Cardiac Enzymes: No results for input(s): CKTOTAL, CKMB,  CKMBINDEX, TROPONINI in the last 168 hours. BNP: BNP (last 3 results) No results for input(s): PROBNP in the last 8760 hours. CBG: No results for input(s): GLUCAP in the last 168 hours.     SignedIrine Seal MD [facility] 05/27/2014, 2:17 PM

## 2014-05-29 MED FILL — Pseudoephedrine HCl Syrup 30 MG/5ML: ORAL | Qty: 5 | Status: AC

## 2014-06-07 DIAGNOSIS — M6281 Muscle weakness (generalized): Secondary | ICD-10-CM | POA: Diagnosis not present

## 2014-06-07 DIAGNOSIS — R262 Difficulty in walking, not elsewhere classified: Secondary | ICD-10-CM | POA: Diagnosis not present

## 2014-06-07 DIAGNOSIS — S82141A Displaced bicondylar fracture of right tibia, initial encounter for closed fracture: Secondary | ICD-10-CM | POA: Diagnosis not present

## 2014-06-07 DIAGNOSIS — S82113A Displaced fracture of unspecified tibial spine, initial encounter for closed fracture: Secondary | ICD-10-CM | POA: Diagnosis not present

## 2014-06-07 DIAGNOSIS — N390 Urinary tract infection, site not specified: Secondary | ICD-10-CM | POA: Diagnosis not present

## 2014-06-07 DIAGNOSIS — G9340 Encephalopathy, unspecified: Secondary | ICD-10-CM | POA: Diagnosis not present

## 2014-06-07 DIAGNOSIS — S82114D Nondisplaced fracture of right tibial spine, subsequent encounter for closed fracture with routine healing: Secondary | ICD-10-CM | POA: Diagnosis not present

## 2014-06-11 DIAGNOSIS — S82141A Displaced bicondylar fracture of right tibia, initial encounter for closed fracture: Secondary | ICD-10-CM | POA: Diagnosis not present

## 2014-06-24 ENCOUNTER — Telehealth: Payer: Self-pay

## 2014-06-24 DIAGNOSIS — S82114A Nondisplaced fracture of right tibial spine, initial encounter for closed fracture: Secondary | ICD-10-CM | POA: Diagnosis not present

## 2014-06-24 DIAGNOSIS — S82109A Unspecified fracture of upper end of unspecified tibia, initial encounter for closed fracture: Secondary | ICD-10-CM | POA: Diagnosis not present

## 2014-06-24 DIAGNOSIS — A047 Enterocolitis due to Clostridium difficile: Secondary | ICD-10-CM | POA: Diagnosis not present

## 2014-06-24 NOTE — Telephone Encounter (Signed)
LM for Cindy to call pt PCP- we have not seen pt since 5/15 and we would need to see  Pt in office.

## 2014-06-24 NOTE — Telephone Encounter (Signed)
Cindy from [facility] called and Laura May is just returning from rehab with broken leg and has developed a cough,congestion(yellowish phelegm) Jenny Reichmann feels like she may need antibiotic   Please call Jenny Reichmann at [place]

## 2014-06-24 NOTE — Telephone Encounter (Signed)
Pt's daughter called regarding antibiotic for pt. Advised of Sara's previous message.

## 2014-06-25 ENCOUNTER — Ambulatory Visit (INDEPENDENT_AMBULATORY_CARE_PROVIDER_SITE_OTHER): Payer: Medicare Other | Admitting: Sports Medicine

## 2014-06-25 ENCOUNTER — Ambulatory Visit (INDEPENDENT_AMBULATORY_CARE_PROVIDER_SITE_OTHER): Payer: Medicare Other

## 2014-06-25 DIAGNOSIS — J189 Pneumonia, unspecified organism: Secondary | ICD-10-CM

## 2014-06-25 DIAGNOSIS — S82114A Nondisplaced fracture of right tibial spine, initial encounter for closed fracture: Secondary | ICD-10-CM | POA: Diagnosis not present

## 2014-06-25 DIAGNOSIS — S82109A Unspecified fracture of upper end of unspecified tibia, initial encounter for closed fracture: Secondary | ICD-10-CM | POA: Diagnosis not present

## 2014-06-25 DIAGNOSIS — R059 Cough, unspecified: Secondary | ICD-10-CM

## 2014-06-25 DIAGNOSIS — R05 Cough: Secondary | ICD-10-CM

## 2014-06-25 DIAGNOSIS — J181 Lobar pneumonia, unspecified organism: Principal | ICD-10-CM

## 2014-06-25 DIAGNOSIS — A047 Enterocolitis due to Clostridium difficile: Secondary | ICD-10-CM | POA: Diagnosis not present

## 2014-06-25 MED ORDER — LEVOFLOXACIN 500 MG PO TABS: 500.0000 mg | ORAL_TABLET | Freq: Every day | ORAL | Status: DC

## 2014-06-25 MED ORDER — HYDROCOD POLST-CHLORPHEN POLST 10-8 MG/5ML PO LQCR: 5.0000 mL | Freq: Two times a day (BID) | ORAL | Status: DC | PRN

## 2014-06-25 NOTE — Progress Notes (Signed)
Patient ID: Laura May, female   DOB: 14-Aug-1931, 81 y.o.   MRN: [id number]  Laura May - 81 y.o. female MRN [id number]  Date of birth: 28-Mar-1932  SUBJECTIVE:  Including CC & ROS.  C/o Cough: Onset of symptoms: 5 Days Patient's an 81 year old African-American female presents today with productive cough for the past week after discharge from skilled nursing facility. Patient had a recent hospitalization admitted on 05/21/14 discharge on 05/27/14 for treatment of right lower extremity cellulitis and tibial stress fracture after fall. After discharge from the hospital patient transition to a skilled nursing facility which he was discharged from on 06/19/14. Other comorbidities include hypertension, GERD, alcoholism, UTI, hypernatremia. Patient presents today with 1 week of productive cough with yellow sputum that is thick. No shortness of breath, no difficulty breathing, no dyspnea on exertion. Also complains of some postnasal drip and upper respiratory congestion. Denies any fevers, body aches, chills, nausea, vomiting, diarrhea. Patient has not been sleeping more frequently or having decreased appetite.  Relieving factors: nothing OTC Symptoms not improving but no worse Vital signs reviewed: Normal respirations, normal pulse ox, normal temperature, normal pulse  ROS:  Constitutional:  No fever, chills, or fatigue.  Respiratory:  No shortness of breath, yes productive cough, no wheezing Cardiovascular:  No palpitations, chest pain or syncope Gastrointestinal:  No nausea, no abdominal pain Review of systems otherwise negative except for what is stated in HPI  HISTORY: Past Medical, Surgical, Social, and Family History Reviewed & Updated per EMR. Pertinent Historical Findings include: Recent hospitalization after a fall suffering a tibial stress fracture. Patient was also treated for lower extremity cellulitis. Patient was transitioned to skilled nursing facility asked her discharge from the  hospital on 05/27/14. Patient returned home on 06/19/13. Hypertension  PHYSICAL EXAM:  VS: BP:124/66 mmHg  HR:86bpm  TEMP:98.3 F (36.8 C)(Oral)  RESP:97 %  HT:4' 9.25" (145.4 cm)   WT:101 lb 4 oz (45.927 kg)  BMI:21.8 PHYSICAL EXAM: General:  Alert and oriented, No acute distress.   HENT:  Normocephalic, Oral mucosa is moist.   URI ENT: Eyes are equal and reactive to light, normal conjunctivae, normal hearing, mucous membrane is moist, no erythema, no exudate.  Bilateral ears have fluid with bulging but no erythema, TMs are intact.  Both nasal passages are inflamed, erythematous, with clear drainage and inflamed turbinate.  Sinus passages are tender to palpation.  Submandibular glands are fluctuant mobile and soft. Respiratory:  Normal respiratory rate, nonlabored breathing. Symmetric air movement. Right lower lung rhonchi and crackles consistent with right lower lobe pneumonia  Cardiovascular:  Normal rate, Regular rhythm, No murmur, Good pulses equal in all extremities, No edema.   Gastrointestinal:  Soft, Non-tender, Non-distended, Normal bowel sounds, No organomegaly.   Integumentary:  Warm, Dry, No rash.   Neurologic:  Alert, Oriented, No focal defects Psychiatric:  Cooperative, Appropriate mood & affect.    [facility] primary read: Jeremie Giangrande, DO review by Dr. Ouida Sills Two-view chest x-ray: Reveals normal bony with no abnormalities. Right lower lobe mild consolidation. No signs of effusion or pneumothorax.   ASSESSMENT & PLAN:  Problem list: Hospital associated pneumonia, acute nature No respiratory distress Hypertension chronic Alcoholism currently sober 1 month. Right lower extremity cellulitis resolving  Patient is an 81 year old female presents today after discharge from her recent hospitalization and skilled nursing admission with a productive yellow sputum cough. Two-view x-rays reveal right lower lobe mild small consolidation consistent with pneumonia. Given the  patient's recent hospitalization and comorbidities we'll treat  her aggressively with fluoroscopy quinolone 500 mg by mouth daily for 7 days. Also provided patient with coughs present with Tussionex. Advised patient's daughter at bedside to discontinue her chronic pain medication for the week while she is using this cough suppressant as it has hydrocodone in it. They verbalize understanding of this plan.  Recommended follow-up in one week to reassess. Provided daughter with precautions of worsening symptoms including shortness of breath, dyspnea on exertion, high grade fever, no clinical improvement after antibiotic course.

## 2014-06-27 DIAGNOSIS — S82109A Unspecified fracture of upper end of unspecified tibia, initial encounter for closed fracture: Secondary | ICD-10-CM | POA: Diagnosis not present

## 2014-06-27 DIAGNOSIS — S82114A Nondisplaced fracture of right tibial spine, initial encounter for closed fracture: Secondary | ICD-10-CM | POA: Diagnosis not present

## 2014-06-27 DIAGNOSIS — A047 Enterocolitis due to Clostridium difficile: Secondary | ICD-10-CM | POA: Diagnosis not present

## 2014-07-01 DIAGNOSIS — A047 Enterocolitis due to Clostridium difficile: Secondary | ICD-10-CM | POA: Diagnosis not present

## 2014-07-01 DIAGNOSIS — S82114A Nondisplaced fracture of right tibial spine, initial encounter for closed fracture: Secondary | ICD-10-CM | POA: Diagnosis not present

## 2014-07-01 DIAGNOSIS — S82109A Unspecified fracture of upper end of unspecified tibia, initial encounter for closed fracture: Secondary | ICD-10-CM | POA: Diagnosis not present

## 2014-07-02 DIAGNOSIS — A047 Enterocolitis due to Clostridium difficile: Secondary | ICD-10-CM | POA: Diagnosis not present

## 2014-07-02 DIAGNOSIS — S82109A Unspecified fracture of upper end of unspecified tibia, initial encounter for closed fracture: Secondary | ICD-10-CM | POA: Diagnosis not present

## 2014-07-02 DIAGNOSIS — S82114A Nondisplaced fracture of right tibial spine, initial encounter for closed fracture: Secondary | ICD-10-CM | POA: Diagnosis not present

## 2014-07-04 DIAGNOSIS — A047 Enterocolitis due to Clostridium difficile: Secondary | ICD-10-CM | POA: Diagnosis not present

## 2014-07-04 DIAGNOSIS — S82114A Nondisplaced fracture of right tibial spine, initial encounter for closed fracture: Secondary | ICD-10-CM | POA: Diagnosis not present

## 2014-07-04 DIAGNOSIS — S82109A Unspecified fracture of upper end of unspecified tibia, initial encounter for closed fracture: Secondary | ICD-10-CM | POA: Diagnosis not present

## 2014-07-08 DIAGNOSIS — S82109A Unspecified fracture of upper end of unspecified tibia, initial encounter for closed fracture: Secondary | ICD-10-CM | POA: Diagnosis not present

## 2014-07-08 DIAGNOSIS — A047 Enterocolitis due to Clostridium difficile: Secondary | ICD-10-CM | POA: Diagnosis not present

## 2014-07-08 DIAGNOSIS — S82114A Nondisplaced fracture of right tibial spine, initial encounter for closed fracture: Secondary | ICD-10-CM | POA: Diagnosis not present

## 2014-07-10 DIAGNOSIS — S82114A Nondisplaced fracture of right tibial spine, initial encounter for closed fracture: Secondary | ICD-10-CM | POA: Diagnosis not present

## 2014-07-10 DIAGNOSIS — S82109A Unspecified fracture of upper end of unspecified tibia, initial encounter for closed fracture: Secondary | ICD-10-CM | POA: Diagnosis not present

## 2014-07-10 DIAGNOSIS — A047 Enterocolitis due to Clostridium difficile: Secondary | ICD-10-CM | POA: Diagnosis not present

## 2014-07-11 DIAGNOSIS — S82141D Displaced bicondylar fracture of right tibia, subsequent encounter for closed fracture with routine healing: Secondary | ICD-10-CM | POA: Diagnosis not present

## 2014-07-13 ENCOUNTER — Ambulatory Visit (INDEPENDENT_AMBULATORY_CARE_PROVIDER_SITE_OTHER): Payer: Medicare Other | Admitting: Emergency Medicine

## 2014-07-13 VITALS — BP 122/60 | HR 80 | Temp 98.9°F | Resp 18 | Ht <= 58 in | Wt 101.0 lb

## 2014-07-13 DIAGNOSIS — K219 Gastro-esophageal reflux disease without esophagitis: Secondary | ICD-10-CM | POA: Diagnosis not present

## 2014-07-13 DIAGNOSIS — E876 Hypokalemia: Secondary | ICD-10-CM

## 2014-07-13 DIAGNOSIS — R059 Cough, unspecified: Secondary | ICD-10-CM

## 2014-07-13 DIAGNOSIS — F0390 Unspecified dementia without behavioral disturbance: Secondary | ICD-10-CM

## 2014-07-13 DIAGNOSIS — R05 Cough: Secondary | ICD-10-CM | POA: Diagnosis not present

## 2014-07-13 LAB — BASIC METABOLIC PANEL
BUN: 15 mg/dL (ref 6–23)
CALCIUM: 10.4 mg/dL (ref 8.4–10.5)
CO2: 19 mEq/L (ref 19–32)
Chloride: 110 mEq/L (ref 96–112)
Creat: 1.09 mg/dL (ref 0.50–1.10)
Glucose, Bld: 77 mg/dL (ref 70–99)
Potassium: 6.2 mEq/L — ABNORMAL HIGH (ref 3.5–5.3)
Sodium: 137 mEq/L (ref 135–145)

## 2014-07-13 MED ORDER — FOLIC ACID 1 MG PO TABS: 1.0000 mg | ORAL_TABLET | Freq: Every day | ORAL | Status: DC

## 2014-07-13 MED ORDER — LOSARTAN POTASSIUM 50 MG PO TABS: 50.0000 mg | ORAL_TABLET | Freq: Every day | ORAL | Status: AC

## 2014-07-13 MED ORDER — MIRTAZAPINE 15 MG PO TABS: 15.0000 mg | ORAL_TABLET | Freq: Every day | ORAL | Status: AC

## 2014-07-13 MED ORDER — CLONAZEPAM 0.5 MG PO TABS: 0.5000 mg | ORAL_TABLET | Freq: Four times a day (QID) | ORAL | Status: AC | PRN

## 2014-07-13 MED ORDER — MELOXICAM 15 MG PO TABS: 15.0000 mg | ORAL_TABLET | Freq: Every day | ORAL | Status: DC

## 2014-07-13 MED ORDER — PANTOPRAZOLE SODIUM 40 MG PO TBEC: 40.0000 mg | DELAYED_RELEASE_TABLET | Freq: Every day | ORAL | Status: AC

## 2014-07-13 NOTE — Progress Notes (Signed)
[facility] [address] [place] [address] [phone]  Date:  07/13/2014   Name:  Laura May   DOB:  01-19-1932   MRN:  [id number]  PCP:  Reginia Forts, MD    Chief Complaint: Cough and Chest Pain   History of Present Illness:  Laura May is a 81 y.o. very pleasant female patient who presents with the following:  Patient released yesterday from detox and rehab.   Needs refills on her medications and still has a persistent cough that is productive mucoid sputum History of GERD and now has waterbrash.  No wheezing or shortness of breath Sputum cleared with antibiotic but persists mucoid. No fever or chills Remains dry regarding alcohol but has no motivation to maintain sobriety. No improvement with over the counter medications or other home remedies.  Denies other complaint or health concern today.   Patient Active Problem List   Diagnosis Date Noted  . Acute encephalopathy 05/24/2014  . Protein-calorie malnutrition, severe 05/23/2014  . Hypernatremia 05/23/2014  . UTI (urinary tract infection) 05/22/2014  . Cellulitis of right foot 05/21/2014  . Fracture of right tibial plateau 05/21/2014  . Anemia 10/14/2013  . Hypokalemia 10/14/2013  . Essential hypertension 10/14/2013  . Osteoarthritis, shoulder 10/14/2013  . GERD (gastroesophageal reflux disease) 10/14/2013  . Alcoholism 10/14/2013  . Insomnia 10/14/2013  . ANEMIA-UNSPECIFIED 02/14/2008  . ABNORMAL FINDINGS GI TRACT 02/14/2008    Past Medical History  Diagnosis Date  . Alcohol abuse   . Brain tumor   . Brain aneurysm   . Arthritis   . Hypertension   . Constipation     last colonoscopy 2011  . Colon polyps 06/07/2009  . Dementia     Past Surgical History  Procedure Laterality Date  . Brain surgery    . Abdominal hysterectomy      DUB; removed B ovaries.    History  Substance Use Topics  . Smoking status: Never Smoker   . Smokeless tobacco: Current User    Types:  Snuff  . Alcohol Use: Yes     Comment: Pint of bourbon per day - none for 4 days - 05/21/14    Family History  Problem Relation Age of Onset  . Cancer Mother   . Heart disease Father     Allergies  Allergen Reactions  . Fexofenadine Other (See Comments)    Unknown reaction    Medication list has been reviewed and updated.  Current Outpatient Prescriptions on File Prior to Visit  Medication Sig Dispense Refill  . docusate sodium (COLACE) 100 MG capsule Take 100 mg by mouth daily as needed for mild constipation.    . feeding supplement, ENSURE COMPLETE, (ENSURE COMPLETE) LIQD Take 237 mLs by mouth 2 (two) times daily between meals.    Marland Kitchen HYDROcodone-acetaminophen (NORCO/VICODIN) 5-325 MG per tablet Take 1-2 tablets by mouth every 4 (four) hours as needed for moderate pain. 20 tablet 0  . levofloxacin (LEVAQUIN) 500 MG tablet Take 1 tablet (500 mg total) by mouth daily. 7 tablet 0  . magnesium hydroxide (MILK OF MAGNESIA) 400 MG/5ML suspension Take by mouth daily as needed for mild constipation.    . meloxicam (MOBIC) 15 MG tablet TAKE ONE TABLET BY MOUTH ONCE DAILY. 30 tablet 2  . mirtazapine (REMERON) 15 MG tablet Take 1 tablet (15 mg total) by mouth at bedtime. 30 tablet 3  . potassium chloride SA (K-DUR,KLOR-CON) 20 MEQ tablet Take 1 tablet (20 mEq total) by mouth  daily. 90 tablet 3  . Thiamine HCl (VITAMIN B-1) 250 MG tablet Take 125 mg by mouth daily.     No current facility-administered medications on file prior to visit.    Review of Systems:  As per HPI, otherwise negative.    Physical Examination: Filed Vitals:   07/13/14 1247  BP: 122/60  Pulse: 80  Temp: 98.9 F (37.2 C)  Resp: 18   Filed Vitals:   07/13/14 1247  Height: 4' 9.25" (1.454 m)  Weight: 101 lb (45.813 kg)   Body mass index is 21.67 kg/(m^2). Ideal Body Weight: Weight in (lb) to have BMI = 25: 116.3  GEN: WDWN, NAD, Non-toxic, A & O x 3  Persistent cough  Non productive HEENT: Atraumatic,  Normocephalic. Neck supple. No masses, No LAD. Ears and Nose: No external deformity. CV: RRR, No M/G/R. No JVD. No thrill. No extra heart sounds. PULM: CTA B, no wheezes, crackles, rhonchi. No retractions. No resp. distress. No accessory muscle use. ABD: S, NT, ND, +BS. No rebound. No HSM. EXTR: No c/c/e NEURO Normal gait.  PSYCH: Normally interactive. Conversant. Not depressed or anxious appearing.  Calm demeanor.    Assessment and Plan: Cough ?due reflux.   On protonix No food or drink after midnight. Elevate foot of bed   Signed,  Ellison Carwin, MD

## 2014-07-13 NOTE — Patient Instructions (Signed)
Gastroesophageal Reflux Disease, Adult Gastroesophageal reflux disease (GERD) happens when acid from your stomach flows up into the esophagus. When acid comes in contact with the esophagus, the acid causes soreness (inflammation) in the esophagus. Over time, GERD may create small holes (ulcers) in the lining of the esophagus. CAUSES   Increased body weight. This puts pressure on the stomach, making acid rise from the stomach into the esophagus.  Smoking. This increases acid production in the stomach.  Drinking alcohol. This causes decreased pressure in the lower esophageal sphincter (valve or ring of muscle between the esophagus and stomach), allowing acid from the stomach into the esophagus.  Late evening meals and a full stomach. This increases pressure and acid production in the stomach.  A malformed lower esophageal sphincter. Sometimes, no cause is found. SYMPTOMS   Burning pain in the lower part of the mid-chest behind the breastbone and in the mid-stomach area. This may occur twice a week or more often.  Trouble swallowing.  Sore throat.  Dry cough.  Asthma-like symptoms including chest tightness, shortness of breath, or wheezing. DIAGNOSIS  Your caregiver may be able to diagnose GERD based on your symptoms. In some cases, X-rays and other tests may be done to check for complications or to check the condition of your stomach and esophagus. TREATMENT  Your caregiver may recommend over-the-counter or prescription medicines to help decrease acid production. Ask your caregiver before starting or adding any new medicines.  HOME CARE INSTRUCTIONS   Change the factors that you can control. Ask your caregiver for guidance concerning weight loss, quitting smoking, and alcohol consumption.  Avoid foods and drinks that make your symptoms worse, such as:  Caffeine or alcoholic drinks.  Chocolate.  Peppermint or mint flavorings.  Garlic and onions.  Spicy foods.  Citrus fruits,  such as oranges, lemons, or limes.  Tomato-based foods such as sauce, chili, salsa, and pizza.  Fried and fatty foods.  Avoid lying down for the 3 hours prior to your bedtime or prior to taking a nap.  Eat small, frequent meals instead of large meals.  Wear loose-fitting clothing. Do not wear anything tight around your waist that causes pressure on your stomach.  Raise the head of your bed 6 to 8 inches with wood blocks to help you sleep. Extra pillows will not help.  Only take over-the-counter or prescription medicines for pain, discomfort, or fever as directed by your caregiver.  Do not take aspirin, ibuprofen, or other nonsteroidal anti-inflammatory drugs (NSAIDs). SEEK IMMEDIATE MEDICAL CARE IF:   You have pain in your arms, neck, jaw, teeth, or back.  Your pain increases or changes in intensity or duration.  You develop nausea, vomiting, or sweating (diaphoresis).  You develop shortness of breath, or you faint.  Your vomit is green, yellow, black, or looks like coffee grounds or blood.  Your stool is red, bloody, or black. These symptoms could be signs of other problems, such as heart disease, gastric bleeding, or esophageal bleeding. MAKE SURE YOU:   Understand these instructions.  Will watch your condition.  Will get help right away if you are not doing well or get worse. Document Released: 03/03/2005 Document Revised: 08/16/2011 Document Reviewed: 12/11/2010 ExitCare Patient Information 2015 [employer]. This information is not intended to replace advice given to you by your health care provider. Make sure you discuss any questions you have with your health care provider.  

## 2014-07-16 DIAGNOSIS — S82109A Unspecified fracture of upper end of unspecified tibia, initial encounter for closed fracture: Secondary | ICD-10-CM | POA: Diagnosis not present

## 2014-07-16 DIAGNOSIS — S82114A Nondisplaced fracture of right tibial spine, initial encounter for closed fracture: Secondary | ICD-10-CM | POA: Diagnosis not present

## 2014-07-16 DIAGNOSIS — A047 Enterocolitis due to Clostridium difficile: Secondary | ICD-10-CM | POA: Diagnosis not present

## 2014-07-19 DIAGNOSIS — S82113A Displaced fracture of unspecified tibial spine, initial encounter for closed fracture: Secondary | ICD-10-CM | POA: Diagnosis not present

## 2014-08-17 DIAGNOSIS — S82113A Displaced fracture of unspecified tibial spine, initial encounter for closed fracture: Secondary | ICD-10-CM | POA: Diagnosis not present

## 2014-09-01 ENCOUNTER — Encounter (HOSPITAL_COMMUNITY): Payer: Self-pay | Admitting: Emergency Medicine

## 2014-09-01 ENCOUNTER — Emergency Department (HOSPITAL_COMMUNITY): Payer: Medicare Other

## 2014-09-01 ENCOUNTER — Emergency Department (HOSPITAL_COMMUNITY)
Admission: EM | Admit: 2014-09-01 | Discharge: 2014-09-01 | Disposition: A | Discharge status: Home / Self Care | Payer: Medicare Other | Attending: Emergency Medicine | Admitting: Emergency Medicine

## 2014-09-01 DIAGNOSIS — Z792 Long term (current) use of antibiotics: Secondary | ICD-10-CM | POA: Diagnosis not present

## 2014-09-01 DIAGNOSIS — Z79899 Other long term (current) drug therapy: Secondary | ICD-10-CM | POA: Diagnosis not present

## 2014-09-01 DIAGNOSIS — I10 Essential (primary) hypertension: Secondary | ICD-10-CM | POA: Insufficient documentation

## 2014-09-01 DIAGNOSIS — Z791 Long term (current) use of non-steroidal anti-inflammatories (NSAID): Secondary | ICD-10-CM | POA: Insufficient documentation

## 2014-09-01 DIAGNOSIS — J45901 Unspecified asthma with (acute) exacerbation: Secondary | ICD-10-CM | POA: Diagnosis not present

## 2014-09-01 DIAGNOSIS — F0390 Unspecified dementia without behavioral disturbance: Secondary | ICD-10-CM | POA: Diagnosis not present

## 2014-09-01 DIAGNOSIS — M1990 Unspecified osteoarthritis, unspecified site: Secondary | ICD-10-CM | POA: Diagnosis not present

## 2014-09-01 DIAGNOSIS — Z8719 Personal history of other diseases of the digestive system: Secondary | ICD-10-CM | POA: Insufficient documentation

## 2014-09-01 DIAGNOSIS — Z86011 Personal history of benign neoplasm of the brain: Secondary | ICD-10-CM | POA: Diagnosis not present

## 2014-09-01 DIAGNOSIS — Z86010 Personal history of colonic polyps: Secondary | ICD-10-CM | POA: Diagnosis not present

## 2014-09-01 DIAGNOSIS — R062 Wheezing: Secondary | ICD-10-CM

## 2014-09-01 DIAGNOSIS — R9431 Abnormal electrocardiogram [ECG] [EKG]: Secondary | ICD-10-CM | POA: Diagnosis not present

## 2014-09-01 DIAGNOSIS — R05 Cough: Secondary | ICD-10-CM | POA: Diagnosis not present

## 2014-09-01 DIAGNOSIS — J45909 Unspecified asthma, uncomplicated: Secondary | ICD-10-CM | POA: Diagnosis present

## 2014-09-01 HISTORY — DX: Unspecified asthma, uncomplicated: J45.909

## 2014-09-01 LAB — CBC
HCT: 33.3 % — ABNORMAL LOW (ref 36.0–46.0)
Hemoglobin: 10.1 g/dL — ABNORMAL LOW (ref 12.0–15.0)
MCH: 22.5 pg — ABNORMAL LOW (ref 26.0–34.0)
MCHC: 30.3 g/dL (ref 30.0–36.0)
MCV: 74.2 fL — ABNORMAL LOW (ref 78.0–100.0)
PLATELETS: 271 10*3/uL (ref 150–400)
RBC: 4.49 MIL/uL (ref 3.87–5.11)
RDW: 18 % — AB (ref 11.5–15.5)
WBC: 8.4 10*3/uL (ref 4.0–10.5)

## 2014-09-01 LAB — BASIC METABOLIC PANEL
ANION GAP: 8 (ref 5–15)
BUN: 16 mg/dL (ref 6–23)
CALCIUM: 9.6 mg/dL (ref 8.4–10.5)
CO2: 20 mmol/L (ref 19–32)
Chloride: 117 mmol/L — ABNORMAL HIGH (ref 96–112)
Creatinine, Ser: 1.02 mg/dL (ref 0.50–1.10)
GFR, EST AFRICAN AMERICAN: 59 mL/min — AB (ref >90)
GFR, EST NON AFRICAN AMERICAN: 51 mL/min — AB (ref >90)
GLUCOSE: 94 mg/dL (ref 70–99)
Potassium: 4.6 mmol/L (ref 3.5–5.1)
SODIUM: 145 mmol/L (ref 135–145)

## 2014-09-01 LAB — I-STAT TROPONIN, ED: Troponin i, poc: 0 ng/mL (ref 0.00–0.08)

## 2014-09-01 MED ORDER — METHYLPREDNISOLONE SODIUM SUCC 125 MG IJ SOLR
125.0000 mg | Freq: Once | INTRAMUSCULAR | Status: DC
  Filled 2014-09-01: qty 2

## 2014-09-01 MED ORDER — ALBUTEROL SULFATE HFA 108 (90 BASE) MCG/ACT IN AERS
1.0000 | INHALATION_SPRAY | RESPIRATORY_TRACT | Status: DC | PRN
  Administered 2014-09-01: 2 via RESPIRATORY_TRACT
  Filled 2014-09-01: qty 6.7

## 2014-09-01 MED ORDER — IPRATROPIUM-ALBUTEROL 0.5-2.5 (3) MG/3ML IN SOLN
3.0000 mL | Freq: Once | RESPIRATORY_TRACT | Status: AC
  Administered 2014-09-01: 3 mL via RESPIRATORY_TRACT
  Filled 2014-09-01: qty 3

## 2014-09-01 MED ORDER — DEXAMETHASONE SODIUM PHOSPHATE 10 MG/ML IJ SOLN
10.0000 mg | Freq: Once | INTRAMUSCULAR | Status: AC
  Administered 2014-09-01: 10 mg via INTRAMUSCULAR
  Filled 2014-09-01: qty 1

## 2014-09-01 NOTE — Discharge Instructions (Signed)
How to Use an Inhaler Proper inhaler technique is very important. Good technique ensures that the medicine reaches the lungs. Poor technique results in depositing the medicine on the tongue and back of the throat rather than in the airways. If you do not use the inhaler with good technique, the medicine will not help you. STEPS TO FOLLOW IF USING AN INHALER WITHOUT AN EXTENSION TUBE 1. Remove the cap from the inhaler. 2. If you are using the inhaler for the first time, you will need to prime it. Shake the inhaler for 5 seconds and release four puffs into the air, away from your face. Ask your health care provider or pharmacist if you have questions about priming your inhaler. 3. Shake the inhaler for 5 seconds before each breath in (inhalation). 4. Position the inhaler so that the top of the canister faces up. 5. Put your index finger on the top of the medicine canister. Your thumb supports the bottom of the inhaler. 6. Open your mouth. 7. Either place the inhaler between your teeth and place your lips tightly around the mouthpiece, or hold the inhaler 1-2 inches away from your open mouth. If you are unsure of which technique to use, ask your health care provider. 8. Breathe out (exhale) normally and as completely as possible. 9. Press the canister down with your index finger to release the medicine. 10. At the same time as the canister is pressed, inhale deeply and slowly until your lungs are completely filled. This should take 4-6 seconds. Keep your tongue down. 11. Hold the medicine in your lungs for 5-10 seconds (10 seconds is best). This helps the medicine get into the small airways of your lungs. 12. Breathe out slowly, through pursed lips. Whistling is an example of pursed lips. 13. Wait at least 15-30 seconds between puffs. Continue with the above steps until you have taken the number of puffs your health care provider has ordered. Do not use the inhaler more than your health care provider  tells you. 14. Replace the cap on the inhaler. 15. Follow the directions from your health care provider or the inhaler insert for cleaning the inhaler. STEPS TO FOLLOW IF USING AN INHALER WITH AN EXTENSION (SPACER) 1. Remove the cap from the inhaler. 2. If you are using the inhaler for the first time, you will need to prime it. Shake the inhaler for 5 seconds and release four puffs into the air, away from your face. Ask your health care provider or pharmacist if you have questions about priming your inhaler. 3. Shake the inhaler for 5 seconds before each breath in (inhalation). 4. Place the open end of the spacer onto the mouthpiece of the inhaler. 5. Position the inhaler so that the top of the canister faces up and the spacer mouthpiece faces you. 6. Put your index finger on the top of the medicine canister. Your thumb supports the bottom of the inhaler and the spacer. 7. Breathe out (exhale) normally and as completely as possible. 8. Immediately after exhaling, place the spacer between your teeth and into your mouth. Close your lips tightly around the spacer. 9. Press the canister down with your index finger to release the medicine. 10. At the same time as the canister is pressed, inhale deeply and slowly until your lungs are completely filled. This should take 4-6 seconds. Keep your tongue down and out of the way. 11. Hold the medicine in your lungs for 5-10 seconds (10 seconds is best). This helps the   medicine get into the small airways of your lungs. Exhale. 12. Repeat inhaling deeply through the spacer mouthpiece. Again hold that breath for up to 10 seconds (10 seconds is best). Exhale slowly. If it is difficult to take this second deep breath through the spacer, breathe normally several times through the spacer. Remove the spacer from your mouth. 13. Wait at least 15-30 seconds between puffs. Continue with the above steps until you have taken the number of puffs your health care provider has  ordered. Do not use the inhaler more than your health care provider tells you. 14. Remove the spacer from the inhaler, and place the cap on the inhaler. 15. Follow the directions from your health care provider or the inhaler insert for cleaning the inhaler and spacer. If you are using different kinds of inhalers, use your quick relief medicine to open the airways 10-15 minutes before using a steroid if instructed to do so by your health care provider. If you are unsure which inhalers to use and the order of using them, ask your health care provider, nurse, or respiratory therapist. If you are using a steroid inhaler, always rinse your mouth with water after your last puff, then gargle and spit out the water. Do not swallow the water. AVOID:  Inhaling before or after starting the spray of medicine. It takes practice to coordinate your breathing with triggering the spray.  Inhaling through the nose (rather than the mouth) when triggering the spray. HOW TO DETERMINE IF YOUR INHALER IS FULL OR NEARLY EMPTY You cannot know when an inhaler is empty by shaking it. A few inhalers are now being made with dose counters. Ask your health care provider for a prescription that has a dose counter if you feel you need that extra help. If your inhaler does not have a counter, ask your health care provider to help you determine the date you need to refill your inhaler. Write the refill date on a calendar or your inhaler canister. Refill your inhaler 7-10 days before it runs out. Be sure to keep an adequate supply of medicine. This includes making sure it is not expired, and that you have a spare inhaler.  SEEK MEDICAL CARE IF:   Your symptoms are only partially relieved with your inhaler.  You are having trouble using your inhaler.  You have some increase in phlegm. SEEK IMMEDIATE MEDICAL CARE IF:   You feel little or no relief with your inhalers. You are still wheezing and are feeling shortness of breath or  tightness in your chest or both.  You have dizziness, headaches, or a fast heart rate.  You have chills, fever, or night sweats.  You have a noticeable increase in phlegm production, or there is blood in the phlegm. MAKE SURE YOU:   Understand these instructions.  Will watch your condition.  Will get help right away if you are not doing well or get worse. Document Released: 05/21/2000 Document Revised: 03/14/2013 Document Reviewed: 12/21/2012 ExitCare Patient Information 2015 [employer]. This information is not intended to replace advice given to you by your health care provider. Make sure you discuss any questions you have with your health care provider.  

## 2014-09-01 NOTE — ED Provider Notes (Signed)
CSN: [id number]     Arrival date & time 09/01/14  0732 History   First MD Initiated Contact with Patient 09/01/14 [phone]     Chief Complaint  Patient presents with  . Asthma  . Cough     HPI Pt BIB daughter. Audible expiratory wheezing. Pt's daughter states that pt has been having a productive cough with white mucous x 4 wks. Was told to take robutussin. Daughter states that she was in a rehab facility about 2 months ago and was dx with pna after and has been having trouble breathing ever since. Pt states cough and SOB is worse at night. Denies pain. Past Medical History  Diagnosis Date  . Alcohol abuse   . Brain tumor   . Brain aneurysm   . Arthritis   . Hypertension   . Constipation     last colonoscopy 2011  . Colon polyps 06/07/2009  . Dementia   . Asthma    Past Surgical History  Procedure Laterality Date  . Brain surgery    . Abdominal hysterectomy      DUB; removed B ovaries.   Family History  Problem Relation Age of Onset  . Cancer Mother   . Heart disease Father    History  Substance Use Topics  . Smoking status: Never Smoker   . Smokeless tobacco: Current User    Types: Snuff  . Alcohol Use: Yes     Comment: Pint of bourbon per day - none for 4 days - 05/21/14   OB History    No data available     Review of Systems  All other systems reviewed and are negative  Allergies  Fexofenadine  Home Medications   Prior to Admission medications   Medication Sig Start Date End Date Taking? Authorizing Provider  clonazePAM (KLONOPIN) 0.5 MG tablet Take 1 tablet (0.5 mg total) by mouth every 6 (six) hours as needed for anxiety. 07/13/14  Yes Roselee Culver, MD  docusate sodium (COLACE) 100 MG capsule Take 100 mg by mouth daily as needed for mild constipation.   Yes Historical Provider, MD  feeding supplement, ENSURE COMPLETE, (ENSURE COMPLETE) LIQD Take 237 mLs by mouth 2 (two) times daily between meals. 05/27/14  Yes Eugenie Filler, MD  losartan  (COZAAR) 50 MG tablet Take 1 tablet (50 mg total) by mouth daily. 07/13/14  Yes Roselee Culver, MD  meloxicam (MOBIC) 15 MG tablet Take 1 tablet (15 mg total) by mouth daily. 07/13/14  Yes Roselee Culver, MD  mirtazapine (REMERON) 15 MG tablet Take 1 tablet (15 mg total) by mouth at bedtime. 07/13/14  Yes Roselee Culver, MD  pantoprazole (PROTONIX) 40 MG tablet Take 1 tablet (40 mg total) by mouth daily. 07/13/14  Yes Roselee Culver, MD  Thiamine HCl (VITAMIN B-1) 250 MG tablet Take 125 mg by mouth daily.   Yes Historical Provider, MD  folic acid (FOLVITE) 1 MG tablet Take 1 tablet (1 mg total) by mouth daily. Patient not taking: Reported on 09/01/2014 07/13/14   Roselee Culver, MD  levofloxacin (LEVAQUIN) 500 MG tablet Take 1 tablet (500 mg total) by mouth daily. Patient not taking: Reported on 09/01/2014 06/25/14   Deanna M Didiano, DO  meloxicam (MOBIC) 15 MG tablet TAKE ONE TABLET BY MOUTH ONCE DAILY. Patient not taking: Reported on 09/01/2014 01/17/14   [name], MD  mirtazapine (REMERON) 15 MG tablet Take 1 tablet (15 mg total) by mouth at bedtime. 10/14/13   Steffanie Dunn  Koren Bound, MD  potassium chloride SA (K-DUR,KLOR-CON) 20 MEQ tablet Take 1 tablet (20 mEq total) by mouth daily. Patient not taking: Reported on 09/01/2014 10/18/13   [name], MD   BP 155/71 mmHg  Pulse 87  Temp(Src) 98.1 F (36.7 C) (Oral)  Resp 21  SpO2 98% Physical Exam  Constitutional: She is oriented to person, place, and time. She appears well-developed and well-nourished. No distress.  HENT:  Head: Normocephalic and atraumatic.  Eyes: Pupils are equal, round, and reactive to light.  Neck: Normal range of motion.  Cardiovascular: Normal rate and intact distal pulses.   Pulmonary/Chest: Tachypnea noted. She is in respiratory distress (Mild). She has wheezes.  Abdominal: Normal appearance. She exhibits no distension.  Musculoskeletal: Normal range of motion.  Neurological: She is alert and oriented to  person, place, and time. No cranial nerve deficit.  Skin: Skin is warm and dry. No rash noted.  Psychiatric: She has a normal mood and affect. Her behavior is normal.  Nursing note and vitals reviewed.   ED Course  Procedures (including critical care time) Medications  albuterol (PROVENTIL HFA;VENTOLIN HFA) 108 (90 BASE) MCG/ACT inhaler 1-2 puff (not administered)  ipratropium-albuterol (DUONEB) 0.5-2.5 (3) MG/3ML nebulizer solution 3 mL (3 mLs Nebulization Given 09/01/14 0754)  dexamethasone (DECADRON) injection 10 mg (10 mg Intramuscular Given 09/01/14 0849)    Labs Review Labs Reviewed  BASIC METABOLIC PANEL - Abnormal; Notable for the following:    Chloride 117 (*)    GFR calc non Af Amer 51 (*)    GFR calc Af Amer 59 (*)    All other components within normal limits  CBC - Abnormal; Notable for the following:    Hemoglobin 10.1 (*)    HCT 33.3 (*)    MCV 74.2 (*)    MCH 22.5 (*)    RDW 18.0 (*)    All other components within normal limits  I-STAT TROPOININ, ED    Imaging Review Dg Chest 2 View (if Patient Has Fever And/or Copd)  09/01/2014   CLINICAL DATA:  Several week history of cough and congestion; difficulty breathing. One day history of wheezing.  EXAM: CHEST  2 VIEW  COMPARISON:  June 25, 2014  FINDINGS: There is no edema or consolidation. Heart size and pulmonary vascularity are normal. There is atherosclerotic change throughout the aorta. No adenopathy. There is degenerative change in the thoracic spine.  IMPRESSION: No edema or consolidation.   Electronically Signed   By: Lowella Grip III M.D.   On: 09/01/2014 08:32     EKG Interpretation   Date/Time:  Sunday September 01 2014 07:50:43 EDT Ventricular Rate:  96 PR Interval:  187 QRS Duration: 76 QT Interval:  378 QTC Calculation: 478 R Axis:   55 Text Interpretation:  Sinus rhythm Atrial premature complex Abnormal T,  consider ischemia, lateral leads Confirmed by Carys Malina  MD, Wise Fees ([id number])  on 09/01/2014  7:57:09 AM     After treatment in the ED the patient feels back to baseline and wants to go home. MDM   Final diagnoses:  None        Leonard Schwartz, MD 09/01/14 1003

## 2014-09-01 NOTE — ED Notes (Signed)
[place], RN starting IV

## 2014-09-01 NOTE — ED Notes (Signed)
Pt BIB daughter.  Audible expiratory wheezing.  Pt's daughter states that pt has been having a productive cough with white mucous x 4 wks.  Was told to take robutussin.  Daughter states that she was in a rehab facility about 2 months ago and was dx with pna after and has been having trouble breathing ever since.  Pt states cough and SOB is worse at night.  Denies pain.

## 2014-09-17 DIAGNOSIS — S82113A Displaced fracture of unspecified tibial spine, initial encounter for closed fracture: Secondary | ICD-10-CM | POA: Diagnosis not present

## 2014-09-19 ENCOUNTER — Encounter: Payer: Self-pay | Admitting: Family Medicine

## 2014-09-19 ENCOUNTER — Ambulatory Visit (INDEPENDENT_AMBULATORY_CARE_PROVIDER_SITE_OTHER): Payer: Medicare Other | Admitting: Family Medicine

## 2014-09-19 VITALS — BP 154/72 | HR 95 | Resp 18

## 2014-09-19 DIAGNOSIS — J45901 Unspecified asthma with (acute) exacerbation: Secondary | ICD-10-CM | POA: Diagnosis not present

## 2014-09-19 DIAGNOSIS — R062 Wheezing: Secondary | ICD-10-CM | POA: Diagnosis not present

## 2014-09-19 MED ORDER — BECLOMETHASONE DIPROPIONATE 80 MCG/ACT IN AERS: 1.0000 | INHALATION_SPRAY | Freq: Two times a day (BID) | RESPIRATORY_TRACT | Status: DC

## 2014-09-19 MED ORDER — PREDNISONE 20 MG PO TABS: ORAL_TABLET | ORAL | Status: DC

## 2014-09-19 MED ORDER — ALBUTEROL SULFATE HFA 108 (90 BASE) MCG/ACT IN AERS: 2.0000 | INHALATION_SPRAY | Freq: Four times a day (QID) | RESPIRATORY_TRACT | Status: DC | PRN

## 2014-09-19 MED ORDER — ALBUTEROL SULFATE (2.5 MG/3ML) 0.083% IN NEBU
2.5000 mg | INHALATION_SOLUTION | Freq: Once | RESPIRATORY_TRACT | Status: AC
  Administered 2014-09-19: 2.5 mg via RESPIRATORY_TRACT

## 2014-09-19 NOTE — Patient Instructions (Signed)
It seems that you are having asthma like symptoms although you do not usually have asthma.  This can be due to allergies or illness Use the qvar inhaler twice a day for the next 2-4 weeks. If you stop using it and your wheezing returns go back on it Use the prednisone pills as directed- this will help prevent wheezing Keep your albuterol puffer handy and use as needed If you are not doing ok please seek care right away!   Do not take your mobic (meloxicam) while you are on prednisone- tylenol is ok though

## 2014-09-19 NOTE — Progress Notes (Signed)
[facility] [address], [place] [address] [phone]  Date:  09/19/2014   Name:  Laura May   DOB:  [birth year]   MRN:  [id number]  PCP:  Reginia Forts, MD    Chief Complaint: No chief complaint on file.   History of Present Illness:  Laura May is a 81 y.o. very pleasant female patient who presents with the following:  Here today with SOB- she has noted wheezing and feeling SOB at home.  She has noted these sx for about 3 days and has been using her albuterol- however this morning her inhaler ran out so she came in to be seen .   She does not have asthma generally. She was seen in the ER on 3/27 with wheezing and cough.  She was treated with duoneb and dexadrom IM- she felt better and went home.   CXR was read as negative at the ER, she had normal troponin and labs She denies any SOB, no DM.  Except for wheezing she feels fine.  No fever    Patient Active Problem List   Diagnosis Date Noted  . Acute encephalopathy 05/24/2014  . Protein-calorie malnutrition, severe 05/23/2014  . Hypernatremia 05/23/2014  . UTI (urinary tract infection) 05/22/2014  . Cellulitis of right foot 05/21/2014  . Fracture of right tibial plateau 05/21/2014  . Anemia 10/14/2013  . Hypokalemia 10/14/2013  . Essential hypertension 10/14/2013  . Osteoarthritis, shoulder 10/14/2013  . GERD (gastroesophageal reflux disease) 10/14/2013  . Alcoholism 10/14/2013  . Insomnia 10/14/2013  . ANEMIA-UNSPECIFIED 02/14/2008  . ABNORMAL FINDINGS GI TRACT 02/14/2008    Past Medical History  Diagnosis Date  . Alcohol abuse   . Brain tumor   . Brain aneurysm   . Arthritis   . Hypertension   . Constipation     last colonoscopy 2011  . Colon polyps 06/07/2009  . Dementia   . Asthma     Past Surgical History  Procedure Laterality Date  . Brain surgery    . Abdominal hysterectomy      DUB; removed B ovaries.    History  Substance Use Topics  . Smoking status: Never  Smoker   . Smokeless tobacco: Current User    Types: Snuff  . Alcohol Use: Yes     Comment: Pint of bourbon per day - none for 4 days - 05/21/14    Family History  Problem Relation Age of Onset  . Cancer Mother   . Heart disease Father     Allergies  Allergen Reactions  . Fexofenadine Other (See Comments)    Unknown reaction    Medication list has been reviewed and updated.  Current Outpatient Prescriptions on File Prior to Visit  Medication Sig Dispense Refill  . clonazePAM (KLONOPIN) 0.5 MG tablet Take 1 tablet (0.5 mg total) by mouth every 6 (six) hours as needed for anxiety. 45 tablet 1  . docusate sodium (COLACE) 100 MG capsule Take 100 mg by mouth daily as needed for mild constipation.    . feeding supplement, ENSURE COMPLETE, (ENSURE COMPLETE) LIQD Take 237 mLs by mouth 2 (two) times daily between meals.    . folic acid (FOLVITE) 1 MG tablet Take 1 tablet (1 mg total) by mouth daily. (Patient not taking: Reported on 09/01/2014) 90 tablet 3  . levofloxacin (LEVAQUIN) 500 MG tablet Take 1 tablet (500 mg total) by mouth daily. (Patient not taking: Reported on 09/01/2014) 7 tablet 0  . losartan (COZAAR) 50  MG tablet Take 1 tablet (50 mg total) by mouth daily. 90 tablet 3  . meloxicam (MOBIC) 15 MG tablet TAKE ONE TABLET BY MOUTH ONCE DAILY. (Patient not taking: Reported on 09/01/2014) 30 tablet 2  . meloxicam (MOBIC) 15 MG tablet Take 1 tablet (15 mg total) by mouth daily. 30 tablet 1  . mirtazapine (REMERON) 15 MG tablet Take 1 tablet (15 mg total) by mouth at bedtime. 30 tablet 3  . mirtazapine (REMERON) 15 MG tablet Take 1 tablet (15 mg total) by mouth at bedtime. 30 tablet 5  . pantoprazole (PROTONIX) 40 MG tablet Take 1 tablet (40 mg total) by mouth daily. 90 tablet 1  . potassium chloride SA (K-DUR,KLOR-CON) 20 MEQ tablet Take 1 tablet (20 mEq total) by mouth daily. (Patient not taking: Reported on 09/01/2014) 90 tablet 3  . Thiamine HCl (VITAMIN B-1) 250 MG tablet Take 125 mg  by mouth daily.     No current facility-administered medications on file prior to visit.    Review of Systems:  As per HPI- otherwise negative.   Physical Examination: There were no vitals filed for this visit. There were no vitals filed for this visit. There is no weight on file to calculate BMI. Ideal Body Weight:    GEN: WDWN, NAD, Non-toxic, A & O x 3, petite, looks well HEENT: Atraumatic, Normocephalic. Neck supple. No masses, No LAD. Ears and Nose: No external deformity. CV: RRR, No M/G/R. No JVD. No thrill. No extra heart sounds. PULM: no crackles, rhonchi. No retractions. No resp. distress. No accessory muscle use. ABD: S, NT, ND, +BS. No rebound. No HSM. EXTR: No c/c/e NEURO Normal gait.  PSYCH: Normally interactive. Conversant. Not depressed or anxious appearing.  Calm demeanor.  She is wheezing a good bit on first exam Treated with albuterol neb- wheezing cleared and she felt "great" Stated that after her tx her sx resolved and she felt back to normal   Assessment and Plan: Reactive airway disease with acute exacerbation - Plan: albuterol (PROVENTIL HFA;VENTOLIN HFA) 108 (90 BASE) MCG/ACT inhaler, beclomethasone (QVAR) 80 MCG/ACT inhaler, predniSONE (DELTASONE) 20 MG tablet  Wheezing - Plan: albuterol (PROVENTIL) (2.5 MG/3ML) 0.083% nebulizer solution 2.5 mg  Recent onset of RAD/ wheezing.  She was recently treated for same in the ER and had a negative CXR and cardiac enzymes Her albuterol ran out this am so her family brought her in.  She felt much better- she stated back to baseline- after albuterol neb  Treat as below; explained that her current sx exacerbation may be due to allergies.  If these continue longer term we will make changes accordingly. She will try qvar to 2-4 weeks and then see if she is able to stop using it without return of sx.   Meds ordered this encounter  Medications  . albuterol (PROVENTIL) (2.5 MG/3ML) 0.083% nebulizer solution 2.5 mg     Sig:   . albuterol (PROVENTIL HFA;VENTOLIN HFA) 108 (90 BASE) MCG/ACT inhaler    Sig: Inhale 2 puffs into the lungs every 6 (six) hours as needed for wheezing or shortness of breath.    Dispense:  1 Inhaler    Refill:  3  . beclomethasone (QVAR) 80 MCG/ACT inhaler    Sig: Inhale 1 puff into the lungs 2 (two) times daily.    Dispense:  1 Inhaler    Refill:  2  . predniSONE (DELTASONE) 20 MG tablet    Sig: Take 2 pills a day for 2 days, then 1  pill a day for 3 days    Dispense:  3 tablet    Refill:  0     Signed Lamar Blinks, MD

## 2014-09-20 ENCOUNTER — Telehealth: Payer: Self-pay

## 2014-09-20 DIAGNOSIS — J45901 Unspecified asthma with (acute) exacerbation: Secondary | ICD-10-CM

## 2014-09-20 NOTE — Telephone Encounter (Signed)
PA for Qvar started.

## 2014-09-24 MED ORDER — FLUTICASONE PROPIONATE HFA 44 MCG/ACT IN AERO: 2.0000 | INHALATION_SPRAY | Freq: Two times a day (BID) | RESPIRATORY_TRACT | Status: DC

## 2014-09-24 NOTE — Telephone Encounter (Signed)
PA for Qvar denied. They will cover Flovent Diskus or Flovent HFA.

## 2014-10-01 ENCOUNTER — Telehealth: Payer: Self-pay

## 2014-10-01 NOTE — Telephone Encounter (Signed)
PA started for Ventolin inhaler.

## 2014-10-02 ENCOUNTER — Ambulatory Visit: Payer: Medicare Other | Admitting: Family Medicine

## 2014-10-04 MED ORDER — ALBUTEROL SULFATE 108 (90 BASE) MCG/ACT IN AEPB: 2.0000 | INHALATION_SPRAY | Freq: Four times a day (QID) | RESPIRATORY_TRACT | Status: DC | PRN

## 2014-10-04 NOTE — Telephone Encounter (Signed)
Insurance denied Proventil. They want pt to try [place].

## 2014-10-16 ENCOUNTER — Telehealth: Payer: Self-pay

## 2014-10-16 NOTE — Telephone Encounter (Signed)
The following email was submitted to your website from Christianne Borrow I am a care manager for Laura May working with [employer] for [employer] and I am trying to get a referral for home care to come into the home for Laura May for respite for her daughter.  She sees Dr. Ammie Ferrier.  I am filling out the form for services and will need her signature.  Is there anything else that needs to be done?   Date of birth: 1931/10/05

## 2014-10-16 NOTE — Telephone Encounter (Signed)
Contact info--not given in original email  [phone]   Markham Jordan, [place] for [facility] [address] [place], [place] [address]  Voice Mail:   [phone] E-Fax:  [phone] Cell:  [phone] Ext:  [phone]

## 2014-10-17 DIAGNOSIS — S82113A Displaced fracture of unspecified tibial spine, initial encounter for closed fracture: Secondary | ICD-10-CM | POA: Diagnosis not present

## 2014-10-18 ENCOUNTER — Other Ambulatory Visit: Payer: Self-pay | Admitting: *Deleted

## 2014-10-18 ENCOUNTER — Other Ambulatory Visit: Payer: Self-pay

## 2014-10-18 MED ORDER — MELOXICAM 15 MG PO TABS: 15.0000 mg | ORAL_TABLET | Freq: Every day | ORAL | Status: DC

## 2014-10-18 NOTE — Telephone Encounter (Signed)
Pt"s daughter called in, and states that she requested from her pharmacy on Monday to have her Mother's meloxicam (MOBIC) 15 MG tablet [[id number]]  Refilled.  Daughter Checked with the pharmacy today and they did not have anything in the system. I also did not see any requests in our system. Pt runs out of this medication tomorrow. Please advise    Pharmacy: [id number] - [place] ([address]), [place] - [place]

## 2014-10-18 NOTE — Telephone Encounter (Signed)
Meloxicam refill sent to pharmacy per Dr. Ouida Sills. Patients daughter linda notified.

## 2014-10-29 DIAGNOSIS — Z13820 Encounter for screening for osteoporosis: Secondary | ICD-10-CM | POA: Diagnosis not present

## 2014-10-29 DIAGNOSIS — Z780 Asymptomatic menopausal state: Secondary | ICD-10-CM | POA: Diagnosis not present

## 2014-10-30 ENCOUNTER — Ambulatory Visit (INDEPENDENT_AMBULATORY_CARE_PROVIDER_SITE_OTHER): Payer: Medicare Other | Admitting: Family Medicine

## 2014-10-30 ENCOUNTER — Encounter: Payer: Self-pay | Admitting: Family Medicine

## 2014-10-30 ENCOUNTER — Ambulatory Visit (INDEPENDENT_AMBULATORY_CARE_PROVIDER_SITE_OTHER): Payer: Medicare Other

## 2014-10-30 VITALS — BP 150/78 | HR 93 | Temp 98.8°F | Resp 18 | Ht <= 58 in | Wt 111.0 lb

## 2014-10-30 DIAGNOSIS — J45901 Unspecified asthma with (acute) exacerbation: Secondary | ICD-10-CM

## 2014-10-30 DIAGNOSIS — J9801 Acute bronchospasm: Secondary | ICD-10-CM

## 2014-10-30 DIAGNOSIS — J301 Allergic rhinitis due to pollen: Secondary | ICD-10-CM

## 2014-10-30 DIAGNOSIS — I10 Essential (primary) hypertension: Secondary | ICD-10-CM

## 2014-10-30 DIAGNOSIS — D649 Anemia, unspecified: Secondary | ICD-10-CM

## 2014-10-30 LAB — COMPREHENSIVE METABOLIC PANEL
ALBUMIN: 3.6 g/dL (ref 3.5–5.2)
ALT: 8 U/L (ref 0–35)
AST: 15 U/L (ref 0–37)
Alkaline Phosphatase: 57 U/L (ref 39–117)
BILIRUBIN TOTAL: 0.5 mg/dL (ref 0.2–1.2)
BUN: 11 mg/dL (ref 6–23)
CO2: 29 meq/L (ref 19–32)
Calcium: 9.3 mg/dL (ref 8.4–10.5)
Chloride: 106 mEq/L (ref 96–112)
Creat: 0.77 mg/dL (ref 0.50–1.10)
Glucose, Bld: 71 mg/dL (ref 70–99)
Potassium: 3.7 mEq/L (ref 3.5–5.3)
Sodium: 143 mEq/L (ref 135–145)
TOTAL PROTEIN: 6.8 g/dL (ref 6.0–8.3)

## 2014-10-30 LAB — CBC WITH DIFFERENTIAL/PLATELET
BASOS ABS: 0 10*3/uL (ref 0.0–0.1)
Basophils Relative: 0 % (ref 0–1)
EOS PCT: 13 % — AB (ref 0–5)
Eosinophils Absolute: 1.5 10*3/uL — ABNORMAL HIGH (ref 0.0–0.7)
HEMATOCRIT: 34 % — AB (ref 36.0–46.0)
Hemoglobin: 10.4 g/dL — ABNORMAL LOW (ref 12.0–15.0)
LYMPHS PCT: 22 % (ref 12–46)
Lymphs Abs: 2.5 10*3/uL (ref 0.7–4.0)
MCH: 22.2 pg — ABNORMAL LOW (ref 26.0–34.0)
MCHC: 30.6 g/dL (ref 30.0–36.0)
MCV: 72.6 fL — ABNORMAL LOW (ref 78.0–100.0)
MONO ABS: 1.6 10*3/uL — AB (ref 0.1–1.0)
Monocytes Relative: 14 % — ABNORMAL HIGH (ref 3–12)
Neutro Abs: 5.8 10*3/uL (ref 1.7–7.7)
Neutrophils Relative %: 51 % (ref 43–77)
Platelets: 402 10*3/uL — ABNORMAL HIGH (ref 150–400)
RBC: 4.68 MIL/uL (ref 3.87–5.11)
RDW: 16.1 % — ABNORMAL HIGH (ref 11.5–15.5)
WBC: 11.3 10*3/uL — AB (ref 4.0–10.5)

## 2014-10-30 MED ORDER — ALBUTEROL SULFATE (2.5 MG/3ML) 0.083% IN NEBU
2.5000 mg | INHALATION_SOLUTION | Freq: Once | RESPIRATORY_TRACT | Status: AC
Start: 2014-10-30 — End: ?

## 2014-10-30 MED ORDER — LORATADINE 10 MG PO TABS: 10.0000 mg | ORAL_TABLET | Freq: Every day | ORAL | Status: DC

## 2014-10-30 MED ORDER — BUDESONIDE-FORMOTEROL FUMARATE 160-4.5 MCG/ACT IN AERO: 2.0000 | INHALATION_SPRAY | Freq: Two times a day (BID) | RESPIRATORY_TRACT | Status: AC

## 2014-10-30 MED ORDER — ALBUTEROL SULFATE (2.5 MG/3ML) 0.083% IN NEBU: 2.5000 mg | INHALATION_SOLUTION | Freq: Four times a day (QID) | RESPIRATORY_TRACT | Status: AC | PRN

## 2014-10-30 MED ORDER — PREDNISONE 20 MG PO TABS: ORAL_TABLET | ORAL | Status: DC

## 2014-10-30 MED ORDER — FLUTICASONE PROPIONATE 50 MCG/ACT NA SUSP: 2.0000 | Freq: Every day | NASAL | Status: AC

## 2014-10-30 NOTE — Progress Notes (Signed)
Subjective:    Patient ID: Laura May, female    DOB: [birth year], 81 y.o.   MRN: [id number]  10/30/2014  Asthma and Arthritis   HPI This 81 y.o. female presents for evaluation of the following:    1.  Wheezing: new onset this year.  Started wheezing after Christmas 2015.  Smoked from age [age]; quit 8 years ago.  No fever.  Recurrent wheezing two weeks ago; went to [facility] for wheezing.  No headache.  No ear pain.  No sore throat.  +rhinorrhea intermittently.  White clear drainage.  Lots of wheezing.  +SOB intermittently.  Taking pump with temporary improvement.  Wanting nebulizer.  No v/d.  Intermittent wheezing.  Pollen will trigger.  Being outside will trigger.  No allergy medications.    2. HTN: Patient reports good compliance with medication, good tolerance to medication, and good symptom control.    Review of Systems  Constitutional: Negative for fever, chills, diaphoresis and fatigue.  Eyes: Negative for visual disturbance.  Respiratory: Negative for cough and shortness of breath.   Cardiovascular: Negative for chest pain, palpitations and leg swelling.  Gastrointestinal: Negative for nausea, vomiting, abdominal pain, diarrhea and constipation.  Endocrine: Negative for cold intolerance, heat intolerance, polydipsia, polyphagia and polyuria.  Neurological: Negative for dizziness, tremors, seizures, syncope, facial asymmetry, speech difficulty, weakness, light-headedness, numbness and headaches.    Past Medical History  Diagnosis Date  . Alcohol abuse   . Brain tumor   . Brain aneurysm   . Arthritis   . Hypertension   . Constipation     last colonoscopy 2011  . Colon polyps 06/07/2009  . Dementia   . Asthma    Past Surgical History  Procedure Laterality Date  . Brain surgery    . Abdominal hysterectomy      DUB; removed B ovaries.   Allergies  Allergen Reactions  . Fexofenadine Other (See Comments)    Unknown reaction   Current Outpatient Prescriptions    Medication Sig Dispense Refill  . Albuterol Sulfate (PROAIR RESPICLICK) 341 (90 BASE) MCG/ACT AEPB Inhale 2 puffs into the lungs every 6 (six) hours as needed. 1 each 3  . clonazePAM (KLONOPIN) 0.5 MG tablet Take 1 tablet (0.5 mg total) by mouth every 6 (six) hours as needed for anxiety. 45 tablet 1  . docusate sodium (COLACE) 100 MG capsule Take 100 mg by mouth daily as needed for mild constipation.    . feeding supplement, ENSURE COMPLETE, (ENSURE COMPLETE) LIQD Take 237 mLs by mouth 2 (two) times daily between meals.    Marland Kitchen losartan (COZAAR) 50 MG tablet Take 1 tablet (50 mg total) by mouth daily. 90 tablet 3  . meloxicam (MOBIC) 15 MG tablet Take 1 tablet (15 mg total) by mouth daily. 30 tablet 1  . mirtazapine (REMERON) 15 MG tablet Take 1 tablet (15 mg total) by mouth at bedtime. 30 tablet 5  . pantoprazole (PROTONIX) 40 MG tablet Take 1 tablet (40 mg total) by mouth daily. 90 tablet 1  . Thiamine HCl (VITAMIN B-1) 250 MG tablet Take 125 mg by mouth daily.    Marland Kitchen albuterol (PROVENTIL HFA;VENTOLIN HFA) 108 (90 BASE) MCG/ACT inhaler Inhale 2 puffs into the lungs every 6 (six) hours as needed for wheezing or shortness of breath. (Patient not taking: Reported on 10/30/2014) 1 Inhaler 3  . albuterol (PROVENTIL) (2.5 MG/3ML) 0.083% nebulizer solution Take 3 mLs (2.5 mg total) by nebulization every 6 (six) hours as needed for wheezing or shortness  of breath. 75 mL 12  . beclomethasone (QVAR) 80 MCG/ACT inhaler Inhale 1 puff into the lungs 2 (two) times daily. (Patient not taking: Reported on 10/30/2014) 1 Inhaler 2  . budesonide-formoterol (SYMBICORT) 160-4.5 MCG/ACT inhaler Inhale 2 puffs into the lungs 2 (two) times daily. 1 Inhaler 11  . fluticasone (FLONASE) 50 MCG/ACT nasal spray Place 2 sprays into both nostrils daily. 16 g 11  . fluticasone (FLOVENT HFA) 44 MCG/ACT inhaler Inhale 2 puffs into the lungs 2 (two) times daily. (Patient not taking: Reported on 10/30/2014) 1 Inhaler 3  . loratadine  (CLARITIN) 10 MG tablet Take 1 tablet (10 mg total) by mouth daily. 90 tablet 3  . potassium chloride SA (K-DUR,KLOR-CON) 20 MEQ tablet Take 1 tablet (20 mEq total) by mouth daily. (Patient not taking: Reported on 09/01/2014) 90 tablet 3  . predniSONE (DELTASONE) 20 MG tablet Take 2 pills a day for 2 days, then 2 pills daily x 5 days, then 1 pill daily x 5 days 21 tablet 0   Current Facility-Administered Medications  Medication Dose Route Frequency Provider Last Rate Last Dose  . albuterol (PROVENTIL) (2.5 MG/3ML) 0.083% nebulizer solution 2.5 mg  2.5 mg Nebulization Once [name], MD           Objective:    BP 150/78 mmHg  Pulse 93  Temp(Src) 98.8 F (37.1 C) (Oral)  Resp 18  Ht 4\' 8"  (1.422 m)  Wt 111 lb (50.349 kg)  BMI 24.90 kg/m2  SpO2 97% Physical Exam  Constitutional: She is oriented to person, place, and time. She appears well-developed and well-nourished. No distress.  HENT:  Head: Normocephalic and atraumatic.  Right Ear: External ear normal.  Left Ear: External ear normal.  Nose: Nose normal.  Mouth/Throat: Oropharynx is clear and moist.  Eyes: Conjunctivae and EOM are normal. Pupils are equal, round, and reactive to light.  Neck: Normal range of motion. Neck supple. Carotid bruit is not present. No thyromegaly present.  Cardiovascular: Normal rate, regular rhythm, normal heart sounds and intact distal pulses.  Exam reveals no gallop and no friction rub.   No murmur heard. Pulmonary/Chest: Effort normal and breath sounds normal. She has no wheezes. She has no rales.  Abdominal: Soft. Bowel sounds are normal. She exhibits no distension and no mass. There is no tenderness. There is no rebound and no guarding.  Lymphadenopathy:    She has no cervical adenopathy.  Neurological: She is alert and oriented to person, place, and time. No cranial nerve deficit.  Skin: Skin is warm and dry. No rash noted. She is not diaphoretic. No erythema. No pallor.  Psychiatric: She  has a normal mood and affect. Her behavior is normal.   Results for orders placed or performed during the hospital encounter of 80/03/49  Basic metabolic panel    (if pt has PMH of COPD)  Result Value Ref Range   Sodium 145 135 - 145 mmol/L   Potassium 4.6 3.5 - 5.1 mmol/L   Chloride 117 (H) 96 - 112 mmol/L   CO2 20 19 - 32 mmol/L   Glucose, Bld 94 70 - 99 mg/dL   BUN 16 6 - 23 mg/dL   Creatinine, Ser 1.02 0.50 - 1.10 mg/dL   Calcium 9.6 8.4 - 10.5 mg/dL   GFR calc non Af Amer 51 (L) >90 mL/min   GFR calc Af Amer 59 (L) >90 mL/min   Anion gap 8 5 - 15  CBC     (if pt  has PMH of COPD)  Result Value Ref Range   WBC 8.4 4.0 - 10.5 K/uL   RBC 4.49 3.87 - 5.11 MIL/uL   Hemoglobin 10.1 (L) 12.0 - 15.0 g/dL   HCT 33.3 (L) 36.0 - 46.0 %   MCV 74.2 (L) 78.0 - 100.0 fL   MCH 22.5 (L) 26.0 - 34.0 pg   MCHC 30.3 30.0 - 36.0 g/dL   RDW 18.0 (H) 11.5 - 15.5 %   Platelets 271 150 - 400 K/uL  I-stat troponin, ED (if patient has history of COPD)  Result Value Ref Range   Troponin i, poc 0.00 0.00 - 0.08 ng/mL   Comment 3           [facility] reading (PRIMARY) by  Dr. Tamala Julian.  CXR: NAD  ALBUTEROL NEBULIZER ADMINISTERED.    Assessment & Plan:   1. Bronchospasm   2. Allergic rhinitis due to pollen   3. Essential hypertension, benign   4. Anemia, unspecified anemia type   5. Reactive airway disease with acute exacerbation     Meds ordered this encounter  Medications  . albuterol (PROVENTIL) (2.5 MG/3ML) 0.083% nebulizer solution 2.5 mg    Sig:   . predniSONE (DELTASONE) 20 MG tablet    Sig: Take 2 pills a day for 2 days, then 2 pills daily x 5 days, then 1 pill daily x 5 days    Dispense:  21 tablet    Refill:  0  . loratadine (CLARITIN) 10 MG tablet    Sig: Take 1 tablet (10 mg total) by mouth daily.    Dispense:  90 tablet    Refill:  3  . fluticasone (FLONASE) 50 MCG/ACT nasal spray    Sig: Place 2 sprays into both nostrils daily.    Dispense:  16 g    Refill:  11  .  budesonide-formoterol (SYMBICORT) 160-4.5 MCG/ACT inhaler    Sig: Inhale 2 puffs into the lungs 2 (two) times daily.    Dispense:  1 Inhaler    Refill:  11  . albuterol (PROVENTIL) (2.5 MG/3ML) 0.083% nebulizer solution    Sig: Take 3 mLs (2.5 mg total) by nebulization every 6 (six) hours as needed for wheezing or shortness of breath.    Dispense:  75 mL    Refill:  12    Return in about 4 weeks (around 11/19/2014) for recheck.     Naod Sweetland Elayne Guerin, M.D. [facility] [address] [place], [place]  [address] [phone] phone [phone] fax

## 2014-10-30 NOTE — Patient Instructions (Signed)
1. Claritin 10mg  --- one tablet daily for allergies/pollen. 2.  Flonase  Nasal spray -- 2 sprays into each nostril daily for pollen/allergies. 3. Symbicort inhaler -- 2 puffs twice daily for wheezing prevention. 4.  Albuterol nebulizer solution--- 1 nebulizer four times daily as needed for wheezing. 5.  Prednisone --- take as prescribed for wheezing.

## 2014-11-01 DIAGNOSIS — J479 Bronchiectasis, uncomplicated: Secondary | ICD-10-CM | POA: Diagnosis not present

## 2014-11-01 DIAGNOSIS — R062 Wheezing: Secondary | ICD-10-CM | POA: Diagnosis not present

## 2014-11-17 DIAGNOSIS — S82113A Displaced fracture of unspecified tibial spine, initial encounter for closed fracture: Secondary | ICD-10-CM | POA: Diagnosis not present

## 2014-11-18 ENCOUNTER — Telehealth: Payer: Self-pay

## 2014-11-18 NOTE — Telephone Encounter (Signed)
[facility] sent a "confirmation of order" fax for Dr Tamala Julian to review and sign. I added some other Dxs to order per OV notes and put in Dr Thompson Caul box for signature after calling [facility] and verifying that it does need to be Dr Tamala Julian herself who signs order.

## 2014-11-20 ENCOUNTER — Emergency Department (HOSPITAL_COMMUNITY): Payer: Medicare Other

## 2014-11-20 ENCOUNTER — Other Ambulatory Visit (HOSPITAL_COMMUNITY): Payer: Self-pay

## 2014-11-20 ENCOUNTER — Encounter (HOSPITAL_COMMUNITY): Payer: Self-pay

## 2014-11-20 ENCOUNTER — Observation Stay (HOSPITAL_COMMUNITY)
Admission: EM | Admit: 2014-11-20 | Discharge: 2014-11-22 | Disposition: A | Discharge status: Home / Self Care | Payer: Medicare Other | Source: Home / Self Care | Attending: Emergency Medicine | Admitting: Emergency Medicine

## 2014-11-20 DIAGNOSIS — R072 Precordial pain: Secondary | ICD-10-CM | POA: Diagnosis not present

## 2014-11-20 DIAGNOSIS — J45901 Unspecified asthma with (acute) exacerbation: Secondary | ICD-10-CM

## 2014-11-20 DIAGNOSIS — J189 Pneumonia, unspecified organism: Secondary | ICD-10-CM | POA: Diagnosis not present

## 2014-11-20 DIAGNOSIS — R4020 Unspecified coma: Secondary | ICD-10-CM | POA: Diagnosis not present

## 2014-11-20 DIAGNOSIS — R7989 Other specified abnormal findings of blood chemistry: Secondary | ICD-10-CM

## 2014-11-20 DIAGNOSIS — R0600 Dyspnea, unspecified: Secondary | ICD-10-CM | POA: Diagnosis not present

## 2014-11-20 DIAGNOSIS — J690 Pneumonitis due to inhalation of food and vomit: Secondary | ICD-10-CM | POA: Diagnosis not present

## 2014-11-20 DIAGNOSIS — R0603 Acute respiratory distress: Secondary | ICD-10-CM | POA: Diagnosis present

## 2014-11-20 DIAGNOSIS — R778 Other specified abnormalities of plasma proteins: Secondary | ICD-10-CM | POA: Insufficient documentation

## 2014-11-20 DIAGNOSIS — I739 Peripheral vascular disease, unspecified: Secondary | ICD-10-CM | POA: Diagnosis not present

## 2014-11-20 DIAGNOSIS — R079 Chest pain, unspecified: Secondary | ICD-10-CM | POA: Insufficient documentation

## 2014-11-20 DIAGNOSIS — G311 Senile degeneration of brain, not elsewhere classified: Secondary | ICD-10-CM | POA: Diagnosis not present

## 2014-11-20 DIAGNOSIS — J45909 Unspecified asthma, uncomplicated: Secondary | ICD-10-CM | POA: Diagnosis not present

## 2014-11-20 DIAGNOSIS — R0602 Shortness of breath: Secondary | ICD-10-CM | POA: Diagnosis not present

## 2014-11-20 DIAGNOSIS — R918 Other nonspecific abnormal finding of lung field: Secondary | ICD-10-CM | POA: Diagnosis not present

## 2014-11-20 DIAGNOSIS — J9601 Acute respiratory failure with hypoxia: Secondary | ICD-10-CM | POA: Diagnosis not present

## 2014-11-20 DIAGNOSIS — I469 Cardiac arrest, cause unspecified: Secondary | ICD-10-CM | POA: Diagnosis not present

## 2014-11-20 DIAGNOSIS — R092 Respiratory arrest: Secondary | ICD-10-CM | POA: Diagnosis not present

## 2014-11-20 DIAGNOSIS — Z87891 Personal history of nicotine dependence: Secondary | ICD-10-CM | POA: Diagnosis not present

## 2014-11-20 HISTORY — DX: Reserved for inherently not codable concepts without codable children: IMO0001

## 2014-11-20 HISTORY — DX: Procedure and treatment not carried out because of patient's decision for reasons of belief and group pressure: Z53.1

## 2014-11-20 LAB — I-STAT CHEM 8, ED
BUN: 12 mg/dL (ref 6–20)
CALCIUM ION: 1.2 mmol/L (ref 1.13–1.30)
Chloride: 107 mmol/L (ref 101–111)
Creatinine, Ser: 1 mg/dL (ref 0.44–1.00)
Glucose, Bld: 146 mg/dL — ABNORMAL HIGH (ref 65–99)
HCT: 37 % (ref 36.0–46.0)
Hemoglobin: 12.6 g/dL (ref 12.0–15.0)
Potassium: 3.3 mmol/L — ABNORMAL LOW (ref 3.5–5.1)
Sodium: 141 mmol/L (ref 135–145)
TCO2: 23 mmol/L (ref 0–100)

## 2014-11-20 LAB — CBC
HCT: 32.7 % — ABNORMAL LOW (ref 36.0–46.0)
HEMOGLOBIN: 10.5 g/dL — AB (ref 12.0–15.0)
MCH: 22.8 pg — ABNORMAL LOW (ref 26.0–34.0)
MCHC: 32.1 g/dL (ref 30.0–36.0)
MCV: 70.9 fL — AB (ref 78.0–100.0)
PLATELETS: 293 10*3/uL (ref 150–400)
RBC: 4.61 MIL/uL (ref 3.87–5.11)
RDW: 14.3 % (ref 11.5–15.5)
WBC: 13 10*3/uL — ABNORMAL HIGH (ref 4.0–10.5)

## 2014-11-20 MED ORDER — CLONAZEPAM 0.5 MG PO TABS
0.5000 mg | ORAL_TABLET | Freq: Three times a day (TID) | ORAL | Status: DC | PRN
  Filled 2014-11-20: qty 1

## 2014-11-20 MED ORDER — BUDESONIDE-FORMOTEROL FUMARATE 160-4.5 MCG/ACT IN AERO
2.0000 | INHALATION_SPRAY | Freq: Two times a day (BID) | RESPIRATORY_TRACT | Status: DC
  Administered 2014-11-21 – 2014-11-22 (×3): 2 via RESPIRATORY_TRACT
  Filled 2014-11-20: qty 6

## 2014-11-20 MED ORDER — ENOXAPARIN SODIUM 40 MG/0.4ML ~~LOC~~ SOLN: 40.0000 mg | SUBCUTANEOUS | Status: DC

## 2014-11-20 MED ORDER — LORATADINE 10 MG PO TABS
10.0000 mg | ORAL_TABLET | Freq: Every day | ORAL | Status: DC
  Administered 2014-11-21 – 2014-11-22 (×2): 10 mg via ORAL
  Filled 2014-11-20 (×3): qty 1

## 2014-11-20 MED ORDER — POTASSIUM CHLORIDE CRYS ER 20 MEQ PO TBCR
40.0000 meq | EXTENDED_RELEASE_TABLET | Freq: Once | ORAL | Status: AC
  Administered 2014-11-21: 40 meq via ORAL
  Filled 2014-11-20: qty 2

## 2014-11-20 MED ORDER — IPRATROPIUM-ALBUTEROL 0.5-2.5 (3) MG/3ML IN SOLN
3.0000 mL | RESPIRATORY_TRACT | Status: DC
  Administered 2014-11-20: 3 mL via RESPIRATORY_TRACT
  Filled 2014-11-20: qty 3

## 2014-11-20 MED ORDER — PANTOPRAZOLE SODIUM 40 MG PO TBEC
40.0000 mg | DELAYED_RELEASE_TABLET | Freq: Every day | ORAL | Status: DC
  Administered 2014-11-21 – 2014-11-22 (×2): 40 mg via ORAL
  Filled 2014-11-20 (×2): qty 1

## 2014-11-20 MED ORDER — IPRATROPIUM-ALBUTEROL 0.5-2.5 (3) MG/3ML IN SOLN
3.0000 mL | RESPIRATORY_TRACT | Status: DC
  Administered 2014-11-21 (×4): 3 mL via RESPIRATORY_TRACT
  Filled 2014-11-20 (×5): qty 3

## 2014-11-20 MED ORDER — PREDNISONE 50 MG PO TABS
50.0000 mg | ORAL_TABLET | Freq: Every day | ORAL | Status: DC
  Administered 2014-11-21 – 2014-11-22 (×2): 50 mg via ORAL
  Filled 2014-11-20 (×3): qty 1

## 2014-11-20 MED ORDER — VITAMIN B-1 100 MG PO TABS
100.0000 mg | ORAL_TABLET | Freq: Every day | ORAL | Status: DC
  Administered 2014-11-21 – 2014-11-22 (×2): 100 mg via ORAL
  Filled 2014-11-20 (×3): qty 1

## 2014-11-20 MED ORDER — SODIUM CHLORIDE 0.9 % IJ SOLN
3.0000 mL | Freq: Two times a day (BID) | INTRAMUSCULAR | Status: DC
  Administered 2014-11-21 (×2): 3 mL via INTRAVENOUS

## 2014-11-20 MED ORDER — SENNOSIDES-DOCUSATE SODIUM 8.6-50 MG PO TABS
1.0000 | ORAL_TABLET | Freq: Every evening | ORAL | Status: DC | PRN
  Filled 2014-11-20: qty 1

## 2014-11-20 MED ORDER — MIRTAZAPINE 15 MG PO TABS
15.0000 mg | ORAL_TABLET | Freq: Every day | ORAL | Status: DC
  Administered 2014-11-21 (×2): 15 mg via ORAL
  Filled 2014-11-20 (×3): qty 1

## 2014-11-20 MED ORDER — ACETAMINOPHEN 325 MG PO TABS: 650.0000 mg | ORAL_TABLET | Freq: Four times a day (QID) | ORAL | Status: DC | PRN

## 2014-11-20 MED ORDER — ENSURE ENLIVE PO LIQD: 237.0000 mL | Freq: Two times a day (BID) | ORAL | Status: DC

## 2014-11-20 MED ORDER — FLUTICASONE PROPIONATE 50 MCG/ACT NA SUSP
2.0000 | Freq: Every day | NASAL | Status: DC
  Administered 2014-11-21 – 2014-11-22 (×2): 2 via NASAL
  Filled 2014-11-20: qty 16

## 2014-11-20 MED ORDER — ALBUTEROL SULFATE (2.5 MG/3ML) 0.083% IN NEBU
2.5000 mg | INHALATION_SOLUTION | RESPIRATORY_TRACT | Status: DC | PRN
  Administered 2014-11-21: 2.5 mg via RESPIRATORY_TRACT

## 2014-11-20 MED ORDER — ACETAMINOPHEN 650 MG RE SUPP: 650.0000 mg | Freq: Four times a day (QID) | RECTAL | Status: DC | PRN

## 2014-11-20 MED ORDER — MELOXICAM 15 MG PO TABS
15.0000 mg | ORAL_TABLET | Freq: Every day | ORAL | Status: DC
  Administered 2014-11-21: 15 mg via ORAL
  Filled 2014-11-20 (×2): qty 1

## 2014-11-20 MED ORDER — ALBUTEROL SULFATE (2.5 MG/3ML) 0.083% IN NEBU
5.0000 mg | INHALATION_SOLUTION | Freq: Once | RESPIRATORY_TRACT | Status: AC
  Administered 2014-11-20: 5 mg via RESPIRATORY_TRACT
  Filled 2014-11-20: qty 6

## 2014-11-20 MED ORDER — LOSARTAN POTASSIUM 50 MG PO TABS
50.0000 mg | ORAL_TABLET | Freq: Every day | ORAL | Status: DC
  Filled 2014-11-20: qty 1

## 2014-11-20 MED ORDER — DOCUSATE SODIUM 100 MG PO CAPS: 100.0000 mg | ORAL_CAPSULE | Freq: Every day | ORAL | Status: DC | PRN

## 2014-11-20 NOTE — ED Notes (Signed)
[facility]- pt coming from home with resp distress. Pt used 2.5mg  of albuterol at home with no relief before calling EMS. Fire administered 5mg  of albuterol. EMS found pt to be tripoding and using accessory muscles on arrival, speaking 1-2 words at a time. Pt received an additional 5mg  of albuterol with EMS, 125mg  of solumedrol, 2G of Mag, and 0.5 of atrovent.

## 2014-11-20 NOTE — Progress Notes (Signed)
Received report.  Raliegh Ip RN

## 2014-11-20 NOTE — H&P (Signed)
[facility] Admission History and Physical Service Pager: [phone]  Patient name: Laura May Medical record number: [id number] Date of birth: 11-13-1931 Age: 81 y.o. Gender: female  Primary Care Provider: Reginia Forts, MD Consultants: none Code Status: DNR - per discussion on admission  Chief Complaint: SOB  Assessment and Plan: Laura May is a 81 y.o. female presenting with Asthma exacerbation . PMH is significant for asthma, GERD, HTN, anemia, h/o alcohol abuse.  Asthma exacerbation: Presenting with wheezing, SOB, and new O2 requirment. S/p 1 duoneb in ED. Satting well on 2L [place]. Per ED report, patient desat to low 90s on RA with movement. Afebrile, VSS. S/p 2g Mag and solumedrol in EMS. CXR with possible infiltrate in RLL, likely atelectasis.  No infectious symptoms. - Admit to obs, FPTS, attending Mingo Amber - Duonebs scheduled q4h, Albuterol nebs q2h prn - Continue home symbicort BID (but patient does not think she is taking this) - holding home QVar and flovent as patient has a lot of duplicates on med list - continue home claritin and flonase as allergies may be playing a role - prednisone 50mg  daily x5d - O2 prn to maintain sats >95% - monitor on telemetry  Chest pain: Patient reporting burning tightness in chest, associated with SOB.  Likely related to asthma exacerbation, but given age and comorbidities will complete ACS r/o.  ED EKG with sinus tachycardia, no ST elevations, unchanged from previous. - Cycle troponins - repeat EKG in AM - monitor on telemetry  Hypokalemia, mild: K 3.3 on admission.  Will likely worsen with albuterol treatments. - replete with KDur 40mg  x1 - Continue to monitor  HTN: BP stable on admission - Continue home losartan 50mg  daily  Microcytic Anemia: Hgb 10.4 on admission (at baseline) - Continue to monitor - Consider starting iron supplementation  Anxiety/Depression: Mood stable on admission - continue home  remeron and klonopin 0.5 TID prn  OA: - Continue home mobic 15mg  daily  H/o alcohol abuse: patient reports she quit drinking in March - Continue home thiamine - monitor for signs of withdrawal, can consider addition of CIWA protocol (but albuterol may contribute to tachycardia and tremulousness)  FEN/GI: Reg diet, SLIV Prophylaxis: SQ heparin  Disposition: Admit to observation, telemetry, FPTS, attending Mingo Amber  History of Present Illness: Laura May is a 81 y.o. female presenting with SOB.  Patient reports that she has had more difficulty breathing since she moved here in March.  She was told that it was asthma.  After she woke up from a nap this morning, she felt like her SOB had acutely worsened.  She has stable 2 pillow orthopnea.  She does not think that she takes a controller medication for asthma, but thinks she takes a big pill for her asthma.  She has been taking PO well.  She reports a substernal tightness and burning associated with her SOB that has seemed to improve with breathing treatment.  She denies any cough, N/V/D, fever/chills, rhinorrhea, sore throat, congestion, sick contacts.  In ambulance: 2g Mag, Solumedrol In ED: duoneb x1  Review Of Systems: Per HPI with the following additions: none, as above Otherwise 12 point review of systems was performed and was unremarkable.  Patient Active Problem List   Diagnosis Date Noted  . Respiratory distress 11/20/2014  . Acute encephalopathy 05/24/2014  . Protein-calorie malnutrition, severe 05/23/2014  . Hypernatremia 05/23/2014  . UTI (urinary tract infection) 05/22/2014  . Cellulitis of right foot 05/21/2014  . Fracture of right tibial  plateau 05/21/2014  . Anemia 10/14/2013  . Hypokalemia 10/14/2013  . Essential hypertension 10/14/2013  . Osteoarthritis, shoulder 10/14/2013  . GERD (gastroesophageal reflux disease) 10/14/2013  . Alcoholism 10/14/2013  . Insomnia 10/14/2013  . ANEMIA-UNSPECIFIED 02/14/2008  .  ABNORMAL FINDINGS GI TRACT 02/14/2008   Past Medical History: Past Medical History  Diagnosis Date  . Alcohol abuse   . Brain tumor   . Brain aneurysm   . Arthritis   . Hypertension   . Constipation     last colonoscopy 2011  . Colon polyps 06/07/2009  . Dementia   . Asthma    Past Surgical History: Past Surgical History  Procedure Laterality Date  . Brain surgery    . Abdominal hysterectomy      DUB; removed B ovaries.   Social History: History  Substance Use Topics  . Smoking status: Former Research scientist (life sciences)  . Smokeless tobacco: Current User    Types: Snuff  . Alcohol Use: Yes     Comment: Pint of bourbon per day - none for 4 days - 05/21/14   Additional social history: former smoker, quit 9 years ago, no secondhand smoke, last drink in March Please also refer to relevant sections of EMR.  Family History: Family History  Problem Relation Age of Onset  . Cancer Mother   . Heart disease Father    Allergies and Medications: Allergies  Allergen Reactions  . Fexofenadine Other (See Comments)    Unknown reaction   No current facility-administered medications on file prior to encounter.   Current Outpatient Prescriptions on File Prior to Encounter  Medication Sig Dispense Refill  . albuterol (PROVENTIL HFA;VENTOLIN HFA) 108 (90 BASE) MCG/ACT inhaler Inhale 2 puffs into the lungs every 6 (six) hours as needed for wheezing or shortness of breath. (Patient not taking: Reported on 10/30/2014) 1 Inhaler 3  . albuterol (PROVENTIL) (2.5 MG/3ML) 0.083% nebulizer solution Take 3 mLs (2.5 mg total) by nebulization every 6 (six) hours as needed for wheezing or shortness of breath. 75 mL 12  . Albuterol Sulfate (PROAIR RESPICLICK) 299 (90 BASE) MCG/ACT AEPB Inhale 2 puffs into the lungs every 6 (six) hours as needed. 1 each 3  . beclomethasone (QVAR) 80 MCG/ACT inhaler Inhale 1 puff into the lungs 2 (two) times daily. (Patient not taking: Reported on 10/30/2014) 1 Inhaler 2  .  budesonide-formoterol (SYMBICORT) 160-4.5 MCG/ACT inhaler Inhale 2 puffs into the lungs 2 (two) times daily. 1 Inhaler 11  . clonazePAM (KLONOPIN) 0.5 MG tablet Take 1 tablet (0.5 mg total) by mouth every 6 (six) hours as needed for anxiety. 45 tablet 1  . docusate sodium (COLACE) 100 MG capsule Take 100 mg by mouth daily as needed for mild constipation.    . feeding supplement, ENSURE COMPLETE, (ENSURE COMPLETE) LIQD Take 237 mLs by mouth 2 (two) times daily between meals.    . fluticasone (FLONASE) 50 MCG/ACT nasal spray Place 2 sprays into both nostrils daily. 16 g 11  . fluticasone (FLOVENT HFA) 44 MCG/ACT inhaler Inhale 2 puffs into the lungs 2 (two) times daily. (Patient not taking: Reported on 10/30/2014) 1 Inhaler 3  . loratadine (CLARITIN) 10 MG tablet Take 1 tablet (10 mg total) by mouth daily. 90 tablet 3  . losartan (COZAAR) 50 MG tablet Take 1 tablet (50 mg total) by mouth daily. 90 tablet 3  . meloxicam (MOBIC) 15 MG tablet Take 1 tablet (15 mg total) by mouth daily. 30 tablet 1  . mirtazapine (REMERON) 15 MG tablet Take 1  tablet (15 mg total) by mouth at bedtime. 30 tablet 5  . pantoprazole (PROTONIX) 40 MG tablet Take 1 tablet (40 mg total) by mouth daily. 90 tablet 1  . potassium chloride SA (K-DUR,KLOR-CON) 20 MEQ tablet Take 1 tablet (20 mEq total) by mouth daily. (Patient not taking: Reported on 09/01/2014) 90 tablet 3  . predniSONE (DELTASONE) 20 MG tablet Take 2 pills a day for 2 days, then 2 pills daily x 5 days, then 1 pill daily x 5 days 21 tablet 0  . Thiamine HCl (VITAMIN B-1) 250 MG tablet Take 125 mg by mouth daily.      Objective: BP 142/62 mmHg  Pulse 114  Resp 18  Ht 4\' 5"  (1.346 m)  Wt 111 lb 8 oz (50.576 kg)  BMI 27.92 kg/m2  SpO2 98% Exam: General: Sitting in bed, NAD, alert and cooperative HEENT: NCAT, MMM, PERRL, EOMI, OP clear Cardiovascular: Tachycardic to 110s, reg rhythm, no m/r/g, 2+ DP pulses b/l Respiratory: Diffuse expiratory wheezes, good air  movement, but slightly decreased in R base. Speaking in full sentences, normal WOB Abdomen: Soft, NTND, +BS, no rebound/guarding Extremities: WWP, no edema Skin: No rashes  Neuro: AAO x3, CN grossly intact, no focal deficits  Labs and Imaging: CBC BMET   Recent Labs Lab 11/20/14 1836 11/20/14 1855  WBC 13.0*  --   HGB 10.5* 12.6  HCT 32.7* 37.0  PLT 293  --     Recent Labs Lab 11/20/14 1855  NA 141  K 3.3*  CL 107  BUN 12  CREATININE 1.00  GLUCOSE 146*     EKG: Sinus tachycardia, nonischemic, unchanged from previous  Dg Chest Port 1 View (11/20/2014):   IMPRESSION: Right lower lobe atelectasis versus infiltrate     Virginia Crews, MD 11/21/2014, 12:35 AM PGY-1, [place] Intern pager: [phone], text pages welcome   FPTS Upper-Level Resident Addendum  I have independently interviewed and examined the patient. I have discussed the above with the original author and agree with their documentation. My edits for correction/addition/clarification are in pink. Please see also any attending notes.   Frazier Richards, MD PGY-2, [place] Service pager: [phone] (text pages welcome through [facility])

## 2014-11-20 NOTE — Telephone Encounter (Signed)
Forms signed and ready for faxing

## 2014-11-20 NOTE — ED Notes (Signed)
Pt advised difficulty breathing.  Applied NRB at 5 L/min.

## 2014-11-20 NOTE — ED Notes (Signed)
Pt ambulated to restroom with RN Assistance. Tolerated well. Upon arrival back to pts room, pt requested to leave O2 off for now. O2 Sat 95% on RA.

## 2014-11-20 NOTE — ED Notes (Signed)
Pt placed on nasal cannula at 2L/min. O2 sat 98%.

## 2014-11-20 NOTE — ED Provider Notes (Signed)
CSN: [id number]     Arrival date & time 11/20/14  1812 History   First MD Initiated Contact with Patient 11/20/14 1821     Chief Complaint  Patient presents with  . Respiratory Distress     (Consider location/radiation/quality/duration/timing/severity/associated sxs/prior Treatment) Patient is a 81 y.o. female presenting with shortness of breath.  Shortness of Breath Severity:  Severe Onset quality:  Gradual Duration:  2 hours Timing:  Constant Progression:  Unchanged Chronicity:  Recurrent Context: weather changes   Relieved by:  Nothing Worsened by:  Weather changes Ineffective treatments:  Inhaler Associated symptoms: wheezing   Associated symptoms: no abdominal pain, no chest pain, no claudication, no cough, no diaphoresis, no ear pain, no fever, no headaches, no hemoptysis, no neck pain, no rash, no sore throat, no sputum production, no syncope, no swollen glands and no vomiting   Risk factors: no hx of PE/DVT, no prolonged immobilization, no recent surgery and no tobacco use     Past Medical History  Diagnosis Date  . Alcohol abuse   . Brain tumor   . Brain aneurysm   . Arthritis   . Hypertension   . Constipation     last colonoscopy 2011  . Colon polyps 06/07/2009  . Dementia   . Asthma    Past Surgical History  Procedure Laterality Date  . Brain surgery    . Abdominal hysterectomy      DUB; removed B ovaries.   Family History  Problem Relation Age of Onset  . Cancer Mother   . Heart disease Father    History  Substance Use Topics  . Smoking status: Former Research scientist (life sciences)  . Smokeless tobacco: Current User    Types: Snuff  . Alcohol Use: Yes     Comment: Pint of bourbon per day - none for 4 days - 05/21/14   OB History    No data available     Review of Systems  Constitutional: Negative for fever, chills, diaphoresis, appetite change and fatigue.  HENT: Negative for congestion, ear pain, facial swelling, mouth sores and sore throat.   Eyes: Negative for  visual disturbance.  Respiratory: Positive for shortness of breath and wheezing. Negative for cough, hemoptysis, sputum production and chest tightness.   Cardiovascular: Negative for chest pain, palpitations, claudication and syncope.  Gastrointestinal: Negative for nausea, vomiting, abdominal pain, diarrhea and blood in stool.  Endocrine: Negative for cold intolerance and heat intolerance.  Genitourinary: Negative for frequency, decreased urine volume and difficulty urinating.  Musculoskeletal: Negative for back pain, neck pain and neck stiffness.  Skin: Negative for rash.  Neurological: Negative for dizziness, weakness, light-headedness and headaches.  All other systems reviewed and are negative.     Allergies  Fexofenadine  Home Medications   Prior to Admission medications   Medication Sig Start Date End Date Taking? Authorizing Provider  albuterol (PROVENTIL HFA;VENTOLIN HFA) 108 (90 BASE) MCG/ACT inhaler Inhale 2 puffs into the lungs every 6 (six) hours as needed for wheezing or shortness of breath. Patient not taking: Reported on 10/30/2014 09/19/14   Darreld Mclean, MD  albuterol (PROVENTIL) (2.5 MG/3ML) 0.083% nebulizer solution Take 3 mLs (2.5 mg total) by nebulization every 6 (six) hours as needed for wheezing or shortness of breath. 10/30/14   [name], MD  Albuterol Sulfate (PROAIR RESPICLICK) 756 (90 BASE) MCG/ACT AEPB Inhale 2 puffs into the lungs every 6 (six) hours as needed. 10/04/14   Darreld Mclean, MD  beclomethasone (QVAR) 80 MCG/ACT inhaler Inhale 1  puff into the lungs 2 (two) times daily. Patient not taking: Reported on 10/30/2014 09/19/14   Darreld Mclean, MD  budesonide-formoterol (SYMBICORT) 160-4.5 MCG/ACT inhaler Inhale 2 puffs into the lungs 2 (two) times daily. 10/30/14   [name], MD  clonazePAM (KLONOPIN) 0.5 MG tablet Take 1 tablet (0.5 mg total) by mouth every 6 (six) hours as needed for anxiety. 07/13/14   Roselee Culver, MD  docusate  sodium (COLACE) 100 MG capsule Take 100 mg by mouth daily as needed for mild constipation.    Historical Provider, MD  feeding supplement, ENSURE COMPLETE, (ENSURE COMPLETE) LIQD Take 237 mLs by mouth 2 (two) times daily between meals. 05/27/14   Eugenie Filler, MD  fluticasone (FLONASE) 50 MCG/ACT nasal spray Place 2 sprays into both nostrils daily. 10/30/14   [name], MD  fluticasone (FLOVENT HFA) 44 MCG/ACT inhaler Inhale 2 puffs into the lungs 2 (two) times daily. Patient not taking: Reported on 10/30/2014 09/24/14   Darreld Mclean, MD  loratadine (CLARITIN) 10 MG tablet Take 1 tablet (10 mg total) by mouth daily. 10/30/14   [name], MD  losartan (COZAAR) 50 MG tablet Take 1 tablet (50 mg total) by mouth daily. 07/13/14   Roselee Culver, MD  meloxicam (MOBIC) 15 MG tablet Take 1 tablet (15 mg total) by mouth daily. 10/18/14   Roselee Culver, MD  mirtazapine (REMERON) 15 MG tablet Take 1 tablet (15 mg total) by mouth at bedtime. 07/13/14   Roselee Culver, MD  pantoprazole (PROTONIX) 40 MG tablet Take 1 tablet (40 mg total) by mouth daily. 07/13/14   Roselee Culver, MD  potassium chloride SA (K-DUR,KLOR-CON) 20 MEQ tablet Take 1 tablet (20 mEq total) by mouth daily. Patient not taking: Reported on 09/01/2014 10/18/13   [name], MD  predniSONE (DELTASONE) 20 MG tablet Take 2 pills a day for 2 days, then 2 pills daily x 5 days, then 1 pill daily x 5 days 10/30/14   [name], MD  Thiamine HCl (VITAMIN B-1) 250 MG tablet Take 125 mg by mouth daily.    Historical Provider, MD   BP 138/66 mmHg  Pulse 108  Resp 28  SpO2 91% Physical Exam  Constitutional: She is oriented to person, place, and time. She appears well-developed and well-nourished. No distress. Face mask in place.  HENT:  Head: Normocephalic and atraumatic.  Right Ear: External ear normal.  Left Ear: External ear normal.  Nose: Nose normal.  Eyes: Conjunctivae and EOM are normal. Pupils are equal,  round, and reactive to light. Right eye exhibits no discharge. Left eye exhibits no discharge. No scleral icterus.  Neck: Normal range of motion. Neck supple.  Cardiovascular: Normal rate, regular rhythm and normal heart sounds.  Exam reveals no gallop and no friction rub.   No murmur heard. Pulmonary/Chest: Effort normal. No stridor. No respiratory distress. She has no decreased breath sounds. She has wheezes.  Abdominal: Soft. She exhibits no distension. There is no tenderness.  Musculoskeletal: She exhibits no edema or tenderness.  Neurological: She is alert and oriented to person, place, and time.  Skin: Skin is warm and dry. No rash noted. She is not diaphoretic. No erythema.  Psychiatric: She has a normal mood and affect.    ED Course  Procedures (including critical care time) Labs Review Labs Reviewed  CBC - Abnormal; Notable for the following:    WBC 13.0 (*)    Hemoglobin 10.5 (*)  HCT 32.7 (*)    MCV 70.9 (*)    MCH 22.8 (*)    All other components within normal limits  I-STAT CHEM 8, ED - Abnormal; Notable for the following:    Potassium 3.3 (*)    Glucose, Bld 146 (*)    All other components within normal limits    Imaging Review Dg Chest Port 1 View  11/20/2014   CLINICAL DATA:  For structure distress.  Difficulty catching breath.  EXAM: PORTABLE CHEST - 1 VIEW  COMPARISON:  10/30/2014  FINDINGS: Normal cardiac silhouette. There is linear opacity at the left lateral lung base. Right lung is clear. Degenerate changes of shoulders, right greater than left.  IMPRESSION: Right lower lobe atelectasis versus infiltrate   Electronically Signed   By: Suzy Bouchard M.D.   On: 11/20/2014 18:38     EKG Interpretation None      MDM   81 year old female with a history of hypertension, asthma who presents in respiratory distress. Patient reported after waking from a nap this afternoon she began feeling short of breath that did not improve with several rounds of albuterol  nebs. EMS was called and on arrival noted the patient had increased work of breathing with accessory muscle use. Patient was given duo nebs, Solu-Medrol, 2 g of magnesium. On arrival patient was hemoglobin and stable with improved work of breathing. Patient was able to speak in full sentences and had good air movement on auscultation. He received additional dose of DuoNeb. She was reassessed and noted to be stable. We continued to monitor the patient throughout her stay however her work of breathing increased and her saturations on room air and decreased to low 90s. Patient was placed on nasal cannula and given additional doses of albuterol. Chest x-ray revealed left lower lobe opacity favoring atelectasis. Patient denied recent fevers, URI symptoms, cough, or sputum production. Patient will be admitted to family medicine for further management of her asthma exacerbation.  Seen in conjunction with Dr. Tamera Punt.  Sibyl Parr, M.D. Resident  Final diagnoses:  Respiratory distress secondary to asthma exacerbation        Addison Lank, MD 11/21/14 0210  Malvin Johns, MD 11/23/14 [phone]

## 2014-11-20 NOTE — Progress Notes (Addendum)
Pt arrived to unit per stretcher from ED. Pt complained of 4/10 pressure in chest. EKG obtained, no abnormal changes. MD paged and notified. No new orders received. HR elevated upon admission and pt hypertensive otherwise VSS. Pt oriented to room, call bell in place. Will continue to monitor closely.   Raliegh Ip RN

## 2014-11-21 ENCOUNTER — Encounter (HOSPITAL_COMMUNITY): Payer: Self-pay | Admitting: General Practice

## 2014-11-21 ENCOUNTER — Inpatient Hospital Stay (HOSPITAL_COMMUNITY): Payer: Medicare Other

## 2014-11-21 DIAGNOSIS — R778 Other specified abnormalities of plasma proteins: Secondary | ICD-10-CM | POA: Insufficient documentation

## 2014-11-21 DIAGNOSIS — R7989 Other specified abnormal findings of blood chemistry: Secondary | ICD-10-CM | POA: Diagnosis not present

## 2014-11-21 DIAGNOSIS — R0600 Dyspnea, unspecified: Secondary | ICD-10-CM

## 2014-11-21 DIAGNOSIS — R079 Chest pain, unspecified: Secondary | ICD-10-CM

## 2014-11-21 DIAGNOSIS — J45901 Unspecified asthma with (acute) exacerbation: Secondary | ICD-10-CM

## 2014-11-21 LAB — LIPID PANEL
CHOL/HDL RATIO: 3.5 ratio
Cholesterol: 345 mg/dL — ABNORMAL HIGH (ref 0–200)
HDL: 100 mg/dL (ref >40)
LDL Cholesterol: 220 mg/dL — ABNORMAL HIGH (ref 0–99)
Triglycerides: 127 mg/dL (ref <150)
VLDL: 25 mg/dL (ref 0–40)

## 2014-11-21 LAB — BASIC METABOLIC PANEL
Anion gap: 14 (ref 5–15)
BUN: 16 mg/dL (ref 6–20)
CO2: 23 mmol/L (ref 22–32)
CREATININE: 1.38 mg/dL — AB (ref 0.44–1.00)
Calcium: 9.3 mg/dL (ref 8.9–10.3)
Chloride: 104 mmol/L (ref 101–111)
GFR, EST AFRICAN AMERICAN: 40 mL/min — AB (ref >60)
GFR, EST NON AFRICAN AMERICAN: 35 mL/min — AB (ref >60)
GLUCOSE: 229 mg/dL — AB (ref 65–99)
Potassium: 3.6 mmol/L (ref 3.5–5.1)
Sodium: 141 mmol/L (ref 135–145)

## 2014-11-21 LAB — CBC
HCT: 31.4 % — ABNORMAL LOW (ref 36.0–46.0)
Hemoglobin: 9.9 g/dL — ABNORMAL LOW (ref 12.0–15.0)
MCH: 22.1 pg — ABNORMAL LOW (ref 26.0–34.0)
MCHC: 31.5 g/dL (ref 30.0–36.0)
MCV: 70.2 fL — ABNORMAL LOW (ref 78.0–100.0)
PLATELETS: 300 10*3/uL (ref 150–400)
RBC: 4.47 MIL/uL (ref 3.87–5.11)
RDW: 14.3 % (ref 11.5–15.5)
WBC: 8.9 10*3/uL (ref 4.0–10.5)

## 2014-11-21 LAB — TROPONIN I
TROPONIN I: 0.13 ng/mL — AB (ref <0.031)
TROPONIN I: 0.16 ng/mL — AB (ref <0.031)
Troponin I: 0.19 ng/mL — ABNORMAL HIGH (ref <0.031)

## 2014-11-21 LAB — TSH: TSH: 0.493 u[IU]/mL (ref 0.350–4.500)

## 2014-11-21 MED ORDER — ASPIRIN 325 MG PO TABS
325.0000 mg | ORAL_TABLET | Freq: Every day | ORAL | Status: DC
  Administered 2014-11-21 – 2014-11-22 (×2): 325 mg via ORAL
  Filled 2014-11-21 (×3): qty 1

## 2014-11-21 MED ORDER — SODIUM CHLORIDE 0.9 % IV SOLN
INTRAVENOUS | Status: AC
  Administered 2014-11-21: 11:00:00 via INTRAVENOUS
  Filled 2014-11-21: qty 1000

## 2014-11-21 MED ORDER — SODIUM CHLORIDE 0.9 % IV SOLN: INTRAVENOUS | Status: DC

## 2014-11-21 MED ORDER — TRAZODONE HCL 50 MG PO TABS
50.0000 mg | ORAL_TABLET | Freq: Once | ORAL | Status: AC
  Administered 2014-11-21: 50 mg via ORAL
  Filled 2014-11-21: qty 1

## 2014-11-21 MED ORDER — HEPARIN SODIUM (PORCINE) 5000 UNIT/ML IJ SOLN
5000.0000 [IU] | Freq: Three times a day (TID) | INTRAMUSCULAR | Status: DC
  Administered 2014-11-21: 5000 [IU] via SUBCUTANEOUS
  Filled 2014-11-21 (×6): qty 1

## 2014-11-21 NOTE — Discharge Summary (Signed)
[facility] Discharge Summary  Patient name: Laura May Medical record number: [id number] Date of birth: 23-Sep-1931 Age: 81 y.o. Gender: female Date of Admission: 11/20/2014  Date of Discharge: 11/15/2014 Admitting Physician: Alveda Reasons, MD  Primary Care Provider: Reginia Forts, MD Consultants: cardiology  Indication for Hospitalization: respiratory distress  Discharge Diagnoses/Problem List:  Asthma exacerbation Atypical chest pain Elevated troponin  Disposition: Discharge home  Discharge Condition: Stable/Improved  Discharge Exam:  Temp: [98.2 F (36.8 C)-98.4 F (36.9 C)] 98.3 F (36.8 C) (06/17 0353) Pulse Rate: [90-112] 90 (06/17 0353) Resp: [18-24] 24 (06/17 0353) BP: (140-152)/(67-72) 151/67 mmHg (06/17 0353) SpO2: [95 %-98 %] 98 % (06/17 0353) Physical Exam: General: awake, alert, pleasant, NAD HEENT: [place]/AT, EOMI Cardiovascular: tachycardic, regular rhythm, no murmurs Respiratory: mild expiratory wheeze, good air movement, no increased WOB, breathing well on RA Abdomen: soft, NT/ND,+BS Extremities: warm, no edema Skin: dry, intact, no rashes Neuro: no focal deficits, follows commands Psych: pleasant, speech normal, mood stable  Brief Hospital Course:  Laura May is a 81 y.o. female that presented with an asthma exacerbation. PMH is significant for asthma, GERD, HTN, anemia, h/o alcohol abuse, former smoker.  In the ED, patient received 1 duoneb.  She was reported to have received 2g Mag and a dose of solumedrol in EMS.  CXR showed questionable LLL infiltrate, this was believed to be atelectasis.  Patient was afebrile and had a nonproductive cough on admission.  She was saturating well on 2L O2 [place].  She was admitted to [place] to observation.  She was treated with scheduled duonebs, PRN albuterol and Prednisone 50 mg daily.  She was also continued on her home Symbicort BID, Flonase and Claritin.  Her symptoms gradually  improved and she was able to be weaned to room air.  Patient was discharged with 3 more days of Prednisone, a refill on her albuterol inhaler and an asthma action plan.  Patient also endorsed burning/tightness in her chest that was associated with shortness of breath.  This was thought likely to be 2/2 to asthma exacerbation; however, given her age and co-morbidities, she was evaluated for possible ACS.  She was monitored on telemetry. EKG in the ED showed sinus tach but no ST elevation.  EKG was stable from previous EKGs.  Troponins were cycled and were found to be mildly elevated.  Repeat EKG with prolonged QTc and sinus tachycardia.  Cardiology was consulted for further evaluation. Risk stratification labs were obtained.  Lipid panel was significant for LDL 220.  Statin was considered.  Patient asked that she discuss this with her PCP before starting.  Echocardiogram was obtained, which revealed an EF 60-65% with normal systolic function and a PA pressure of 46 mm Hg. Cardiology recommended low dose once daily Cardizem, in the setting of lung disease, low dose aspirin and continued stratification with outpatient Myoview.  This was discussed with patient, who agreed with plan.  Patent also noted to be mildly hypokalemic on admission with a K 3.3.  This was thought to be 2/2 to albuterol treatment.  She was repleted with KDur 40 mEq x1.  Patient responded appropriately.  Potassium continued to be monitored daily.  Patient noted to have an AKI to 1.38 on hospital day #2.  She was started on IVFs and her home Mobic for osteoarthritis was discontinued.  Nephrotoxic agents were avoided.  Cr was monitored and showed improvement with IVFs.    Patient was discharged in stable condition.  Her breathing  was baseline.  Prescriptions were provided for albuterol and Cardizem and patient reported that she had baby aspirin at home.  Asthma action plan was reviewed with patient and a copy was provided at discharge.   Discharge instructions and return precautions were reviewed with patient, who voiced good understanding.  Issues for Follow Up:  1. Outpatient Pulmonary function testing 2. Consider adding Singulair 3. Consider statin for elevated LDL 4. Cardizem CD 120 added for rate control during hospitalization. 5. COPD/Asthma regimen 6. Patient to f/u with Cardiology for [facility] outpatient. F/u these results 7. Follow up Cr.  Mobic discontinued at discharge.    Significant Procedures: none  Significant Labs and Imaging:   Recent Labs Lab 11/20/14 1836 11/20/14 1855 11/21/14 0449 11/15/2014 0810  WBC 13.0*  --  8.9 12.8*  HGB 10.5* 12.6 9.9* 10.2*  HCT 32.7* 37.0 31.4* 33.0*  PLT 293  --  300 288    Recent Labs Lab 11/20/14 1855 11/21/14 0449 12/02/2014 0810  NA 141 141 143  K 3.3* 3.6 4.0  CL 107 104 110  CO2  --  23 25  GLUCOSE 146* 229* 99  BUN 12 16 21*  CREATININE 1.00 1.38* 1.30*  CALCIUM  --  9.3 9.5   Cardiac Panel (last 3 results)  Recent Labs  11/20/14 2303 11/21/14 0449 11/21/14 1115  TROPONINI 0.13* 0.19* 0.16*   Lipid Panel     Component Value Date/Time   CHOL 345* 11/21/2014 0829   TRIG 127 11/21/2014 0829   HDL 100 11/21/2014 0829   CHOLHDL 3.5 11/21/2014 0829   VLDL 25 11/21/2014 0829   LDLCALC 220* 11/21/2014 0829   2D echo Study Conclusions - Left ventricle: The cavity size was normal. Wall thickness was normal. Systolic function was normal. The estimated ejection fraction was in the range of 60% to 65%. - Aortic valve: LVOT appears narrow. No obvious SAM but appears to be small LVOT gradient Valve area (VTI): 1.89 cm^2. Valve area (Vmax): 1.76 cm^2. Valve area (Vmean): 2.05 cm^2. - Mitral valve: Calcified leaflet tips. Valve area by continuity equation (using LVOT flow): 2.97 cm^2. - Left atrium: The atrium was mildly dilated. - Atrial septum: No defect or patent foramen ovale was identified. - Pulmonary arteries: PA peak pressure:  46 mm Hg (S).  CXR  COMPARISON: 10/30/2014  FINDINGS: Normal cardiac silhouette. There is linear opacity at the left lateral lung base. Right lung is clear. Degenerate changes of shoulders, right greater than left.  IMPRESSION: Right lower lobe atelectasis versus infiltrate  Results/Tests Pending at Time of Discharge: none  Discharge Medications:    Medication List    STOP taking these medications        beclomethasone 80 MCG/ACT inhaler  Commonly known as:  QVAR     fluticasone 44 MCG/ACT inhaler  Commonly known as:  FLOVENT HFA     meloxicam 15 MG tablet  Commonly known as:  MOBIC      TAKE these medications        albuterol (2.5 MG/3ML) 0.083% nebulizer solution  Commonly known as:  PROVENTIL  Take 3 mLs (2.5 mg total) by nebulization every 6 (six) hours as needed for wheezing or shortness of breath.     albuterol 108 (90 BASE) MCG/ACT inhaler  Commonly known as:  PROVENTIL HFA;VENTOLIN HFA  Inhale 2 puffs into the lungs every 6 (six) hours as needed for wheezing or shortness of breath.     budesonide-formoterol 160-4.5 MCG/ACT inhaler  Commonly known as:  SYMBICORT  Inhale 2 puffs into the lungs 2 (two) times daily.     clonazePAM 0.5 MG tablet  Commonly known as:  KLONOPIN  Take 1 tablet (0.5 mg total) by mouth every 6 (six) hours as needed for anxiety.     diltiazem 120 MG 24 hr capsule  Commonly known as:  CARDIZEM CD  Take 1 capsule (120 mg total) by mouth daily.     docusate sodium 100 MG capsule  Commonly known as:  COLACE  Take 100 mg by mouth daily.     feeding supplement (ENSURE COMPLETE) Liqd  Take 237 mLs by mouth 2 (two) times daily between meals.     fluticasone 50 MCG/ACT nasal spray  Commonly known as:  FLONASE  Place 2 sprays into both nostrils daily.     losartan 50 MG tablet  Commonly known as:  COZAAR  Take 1 tablet (50 mg total) by mouth daily.     MILK OF MAGNESIA PO  Take 1 each by mouth daily as needed (constipation).      mirtazapine 15 MG tablet  Commonly known as:  REMERON  Take 1 tablet (15 mg total) by mouth at bedtime.     pantoprazole 40 MG tablet  Commonly known as:  PROTONIX  Take 1 tablet (40 mg total) by mouth daily.     potassium chloride SA 20 MEQ tablet  Commonly known as:  K-DUR,KLOR-CON  Take 1 tablet (20 mEq total) by mouth daily.     predniSONE 50 MG tablet  Commonly known as:  DELTASONE  Take 1 tablet (50 mg total) by mouth daily with breakfast.     vitamin B-1 250 MG tablet  Take 125 mg by mouth daily.        Discharge Instructions: Please refer to Patient Instructions section of EMR for full details.  Patient was counseled important signs and symptoms that should prompt return to medical care, changes in medications, dietary instructions, activity restrictions, and follow up appointments.   Follow-Up Appointments: Follow-up Information    Follow up with Jenkins Rouge, MD.   Specialty:  Cardiology   Why:  The office will call with information on stress testing and a follow-up appointment.   Contact information:   [address] [place] [address] [phone]       Follow up with Reginia Forts, MD. Schedule an appointment as soon as possible for a visit in 1 week.   Specialty:  Family Medicine   Why:  hospital follow up   Contact information:   [place] [address] [phone]       Janora Norlander, DO , 2:49 PM PGY-1, [place]

## 2014-11-21 NOTE — Progress Notes (Signed)
Family Medicine Teaching Service Daily Progress Note Intern Pager: [phone]  Patient name: Laura May Medical record number: [id number] Date of birth: 1931-11-06 Age: 81 y.o. Gender: female  Primary Care Provider: Reginia Forts, MD Consultants: none Code Status: DNR   Pt Overview and Major Events to Date:  06/15: Admitted  Assessment and Plan: Laura May is a 81 y.o. female presenting with Asthma exacerbation . PMH is significant for asthma, GERD, HTN, anemia, h/o alcohol abuse.  Asthma exacerbation: Presenting with wheezing, SOB, and new O2 requirment. S/p 1 duoneb in ED. Satting well on 2L [place]. Per ED report, patient desat to low 90s on RA with movement. Afebrile, VSS. S/p 2g Mag and solumedrol in EMS. CXR with possible infiltrate in RLL, likely atelectasis. No infectious symptoms. - Duonebs scheduled q4h (refused), Albuterol nebs q2h prn (1PRN ovnt) - Continue home symbicort BID (but patient does not think she is taking this) - holding home QVar and flovent as patient has a lot of duplicates on med list - continue home claritin and flonase as allergies may be playing a role - prednisone 50mg  daily x5d - O2 prn to maintain sats >95% - monitor on telemetry  Chest pain: Resolved but troponins 0.13>0.19.  Repeat EKG in AM: Qtc 549, sinus tach.  Patient reporting burning tightness in chest, associated with SOB. Likely related to asthma exacerbation, but given age and comorbidities will complete ACS r/o. ED EKG with sinus tachycardia, no ST elevations, unchanged from previous. - c/s to cards, appreciate recs - risk strat labs ordered - monitor on telemetry - once breathing improved, consider Coreg  AKI: Cr 1.0 on admission. 1.38 this am. -IVFs: NS @100cc /h x12h  Hypokalemia, mild: resolved.  K 3.6 this am.  K 3.3 on admission. Will likely worsen with albuterol treatments. - replete with KDur 40mg  x1 - Continue to monitor  HTN: BP 166/78 - Continue home losartan 50mg   daily  Microcytic Anemia: Hgb 10.4 on admission (at baseline) - Continue to monitor - Consider starting iron supplementation  Anxiety/Depression: Mood stable on admission - continue home remeron and klonopin 0.5 TID prn  OA: - STOP home mobic 15mg  daily, esp in setting of AKI  H/o alcohol abuse: patient reports she quit drinking in March - Continue home thiamine - monitor for signs of withdrawal, can consider addition of CIWA protocol (but albuterol may contribute to tachycardia and tremulousness)  FEN/GI: Reg diet, SLIV Prophylaxis: SQ heparin  Disposition: Discharge once breathing on RA and cardiac evaluation.  Subjective:  Patient reports that breathing has gotten much better.  She denies SOB this am and feels that she can come off O2.  She does not use O2 at home.  We discussed cardiac enzymes and that patient will be seen by cards this am.  Patient voices good understanding but reiterates she is eager to go home today.  No CP, diaphoresis, N/V  Objective: Temp:  [98 F (36.7 C)] 98 F (36.7 C) (06/16 0605) Pulse Rate:  [102-122] 114 (06/16 0033) Resp:  [15-28] 20 (06/16 0605) BP: (128-166)/(55-78) 166/78 mmHg (06/16 0605) SpO2:  [87 %-100 %] 95 % (06/16 0605) Weight:  [111 lb 8 oz (50.576 kg)] 111 lb 8 oz (50.576 kg) (06/15 2301) Physical Exam: General: awake, alert, NAD Cardiovascular: tachycardic, regular rhythm, no murmurs Respiratory: mild expiratory wheeze, good air movement, no increased WOB, 3L [place] in place Abdomen: soft, NT/ND,+BS Extremities: warm, no edema  Laboratory:  Recent Labs Lab 11/20/14 1836 11/20/14 1855 11/21/14 0449  WBC  13.0*  --  8.9  HGB 10.5* 12.6 9.9*  HCT 32.7* 37.0 31.4*  PLT 293  --  300    Recent Labs Lab 11/20/14 1855 11/21/14 0449  NA 141 141  K 3.3* 3.6  CL 107 104  CO2  --  23  BUN 12 16  CREATININE 1.00 1.38*  CALCIUM  --  9.3  GLUCOSE 146* 229*    Cardiac Panel (last 3 results)  Recent Labs  11/20/14 2303  11/21/14 0449  TROPONINI 0.13* 0.19*    Imaging/Diagnostic Tests: Dg Chest Port 1 View  11/20/2014   CLINICAL DATA:  For structure distress.  Difficulty catching breath.  EXAM: PORTABLE CHEST - 1 VIEW  COMPARISON:  10/30/2014  FINDINGS: Normal cardiac silhouette. There is linear opacity at the left lateral lung base. Right lung is clear. Degenerate changes of shoulders, right greater than left.  IMPRESSION: Right lower lobe atelectasis versus infiltrate   Electronically Signed   By: Suzy Bouchard M.D.   On: 11/20/2014 18:38   Janora Norlander, DO 11/21/2014, 7:12 AM PGY-1, [place] Intern pager: [phone], text pages welcome

## 2014-11-21 NOTE — Progress Notes (Signed)
MD paged regarding positive troponin. No new orders received. Will continue to monitor.   Raliegh Ip RN

## 2014-11-21 NOTE — Care Management Note (Signed)
Case Management Note  Patient Details  Name: Laura May MRN: [id number] Date of Birth: September 17, 1931  Subjective/Objective:     Pt admitted with respiratory distress               Action/Plan: Pt is from home with granddaughter.  CM will continue to follow for disposition plan   Expected Discharge Date:                  Expected Discharge Plan:  Home/Self Care  In-House Referral:     Discharge planning Services  CM Consult  Post Acute Care Choice:    Choice offered to:     DME Arranged:    DME Agency:     HH Arranged:    HH Agency:     Status of Service:  In process, will continue to follow  Medicare Important Message Given:  Yes Date Medicare IM Given:   11/21/14 Medicare IM give by:   Elenor Quinones Date Additional Medicare IM Given:    Additional Medicare Important Message give by:     If discussed at [place] of Stay Meetings, dates discussed:    Additional Comments: 11/21/14 Elenor Quinones, RN, BSN [phone] CM assessed pt; pt is independent from home, pt uses cane at home.  CM will continue to monitor   Maryclare Labrador, RN 11/21/2014, 11:42 AM

## 2014-11-21 NOTE — Progress Notes (Signed)
Patient ID: Laura May, female   DOB: [birth year], 81 y.o.   MRN: [id number] Patient seen and examined full consult to follow  Needs 2 D echo for now  [employer]

## 2014-11-21 NOTE — Progress Notes (Signed)
Echocardiogram 2D Echocardiogram has been performed.  Jennette Dubin 11/21/2014, 3:48 PM

## 2014-11-21 NOTE — Progress Notes (Signed)
MD paged regarding hgb level and second troponin. No new orders given. Will continue to watch closely.  Raliegh Ip RN

## 2014-11-21 NOTE — Consult Note (Signed)
CARDIOLOGY CONSULT NOTE       Patient ID: Laura May MRN: [id number] DOB/AGE: 1931-11-28 81 y.o.  Admit date: 11/20/2014 Referring Physician:  Mingo Amber Primary Physician: Reginia Forts, MD Primary Cardiologist:  New Reason for Consultation: Elevated Troponin / Chest pain  Active Problems:   Respiratory distress   Asthma exacerbation   Pain in the chest   Elevated troponin   HPI:  81 y.o. DNR with asthma exacerbation. Former smoker No previous CAD.  CRF HTN.  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  48 hrs increased wheezing and dyspnea.  Has responded to nebs / steroids.  With wheezing has had some atypical central chest tightness gone when breathing / wheezing better.  Troponins minimally elevated with no evolution.  ECG non acute other than unexplained tachycardia.  I have ordered echo for today.  Still drinking a pint of liquor a day  ROS All other systems reviewed and negative except as noted above  Past Medical History  Diagnosis Date  . Alcohol abuse   . Brain tumor   . Brain aneurysm   . Arthritis   . Hypertension   . Constipation     last colonoscopy 2011  . Colon polyps 06/07/2009  . Dementia   . Asthma   . Shortness of breath dyspnea   . Refusal of blood transfusions as patient is Jehovah's Witness     Family History  Problem Relation Age of Onset  . Cancer Mother   . Heart disease Father     History   Social History  . Marital Status: Widowed    Spouse Name: N/A  . Number of Children: N/A  . Years of Education: N/A   Occupational History  . Not on file.   Social History Main Topics  . Smoking status: Former Research scientist (life sciences)  . Smokeless tobacco: Former Systems developer    Types: Snuff  . Alcohol Use: Yes     Comment: Pint of bourbon per day - none for 4 days - 05/21/14   quit drinking march 2016  . Drug Use: No  . Sexual Activity: Not Currently   Other Topics Concern  . Not on file   Social History Narrative   Marital status: widowed since  age 60; second husband.      Children: two children; 2 grandchildren; 7 gg      Lives: with daughter; moved from [place].      Employment: retired; previous Land work.      Tobacco: quit 15 years ago; dips snuff.      Alcohol:  Daily alcohol brandy 1/2 pint-1 pint; years.      ADLS:  No driving; never drove. Rare cooking; daughter cooks.      Past Surgical History  Procedure Laterality Date  . Brain surgery    . Abdominal hysterectomy      DUB; removed B ovaries.     Marland Kitchen aspirin  325 mg Oral Daily  . budesonide-formoterol  2 puff Inhalation BID  . feeding supplement (ENSURE ENLIVE)  237 mL Oral BID BM  . fluticasone  2 spray Each Nare Daily  . heparin subcutaneous  5,000 Units Subcutaneous 3 times per day  . ipratropium-albuterol  3 mL Nebulization Q4H  . loratadine  10 mg Oral Daily  . mirtazapine  15 mg Oral QHS  . pantoprazole  40 mg Oral Daily  . predniSONE  50 mg Oral Q breakfast  . sodium chloride  3 mL Intravenous Q12H  . thiamine  100 mg Oral Daily   . sodium chloride 0.9 % 1,000 mL infusion 100 mL/hr at 11/21/14 1049    Physical Exam: Blood pressure 166/78, pulse 114, temperature 98 F (36.7 C), temperature source Oral, resp. rate 20, height 4\' 5"  (1.346 m), weight 50.576 kg (111 lb 8 oz), SpO2 95 %.    Affect appropriate Chronically ill black female  HEENT: normal Neck supple with no adenopathy JVP normal no bruits no thyromegaly Lungs rhonchi with exp  wheezing and good diaphragmatic motion Heart:  S1/S2 no murmur, no rub, gallop or click PMI normal Abdomen: benighn, BS positve, no tenderness, no AAA no bruit.  No HSM or HJR Distal pulses intact with no bruits No edema Neuro non-focal Skin warm and dry No muscular weakness   Labs:   Lab Results  Component Value Date   WBC 8.9 11/21/2014   HGB 9.9* 11/21/2014   HCT 31.4* 11/21/2014   MCV 70.2* 11/21/2014   PLT 300 11/21/2014    Recent Labs Lab 11/21/14 0449  NA 141  K 3.6  CL 104  CO2 23    BUN 16  CREATININE 1.38*  CALCIUM 9.3  GLUCOSE 229*   Lab Results  Component Value Date   TROPONINI 0.19* 11/21/2014    Lab Results  Component Value Date   CHOL 345* 11/21/2014   Lab Results  Component Value Date   HDL 100 11/21/2014   Lab Results  Component Value Date   LDLCALC 220* 11/21/2014   Lab Results  Component Value Date   TRIG 127 11/21/2014   Lab Results  Component Value Date   CHOLHDL 3.5 11/21/2014   No results found for: LDLDIRECT    Radiology: Dg Chest 2 View  10/31/2014   CLINICAL DATA:  Wheezing and cough for 1 week  EXAM: CHEST  2 VIEW  COMPARISON:  09/01/2014  FINDINGS: Cardiac shadow is within normal limits. The lungs are well aerated bilaterally. Diffuse aortic calcifications are noted. No acute bony abnormality is seen.  IMPRESSION: No acute abnormality noted.   Electronically Signed   By: Inez Catalina M.D.   On: 10/31/2014 08:51   Dg Chest Port 1 View  11/20/2014   CLINICAL DATA:  For structure distress.  Difficulty catching breath.  EXAM: PORTABLE CHEST - 1 VIEW  COMPARISON:  10/30/2014  FINDINGS: Normal cardiac silhouette. There is linear opacity at the left lateral lung base. Right lung is clear. Degenerate changes of shoulders, right greater than left.  IMPRESSION: Right lower lobe atelectasis versus infiltrate   Electronically Signed   By: Suzy Bouchard M.D.   On: 11/20/2014 18:38    EKG:  ST QT prolonged  410/549  Rate 108 normal ST segments   ASSESSMENT AND PLAN:  Chest pain:  Atypical  No acute ST changes troponin nonspecific  Nurther w/u in this elderly demented DNR if echo shows normal EF and no RWMA;s  Would not use beta blocker until wheezing improved  Dyspnea:  RLL infiltrate ? Aspiration antibiotics and Rx asthma per primary service consider d dimer  ETOH:  Some concern that tachycardia is from withdrawal but she otherwise appears non agitated    Chol:  Elevated but no documented vascular disease and no clear indication in  octogenarian   Signed: Jenkins Rouge 11/21/2014, 10:53 AM

## 2014-11-22 ENCOUNTER — Encounter (HOSPITAL_COMMUNITY): Payer: Self-pay | Admitting: Emergency Medicine

## 2014-11-22 ENCOUNTER — Other Ambulatory Visit: Payer: Self-pay | Admitting: Physician Assistant

## 2014-11-22 ENCOUNTER — Inpatient Hospital Stay (HOSPITAL_COMMUNITY)
Admission: EM | Admit: 2014-11-22 | Discharge: 2014-12-06 | DRG: 296 | Disposition: E | Payer: Medicare Other | Attending: Emergency Medicine | Admitting: Emergency Medicine

## 2014-11-22 ENCOUNTER — Emergency Department (HOSPITAL_COMMUNITY): Payer: Medicare Other

## 2014-11-22 DIAGNOSIS — J9811 Atelectasis: Secondary | ICD-10-CM | POA: Diagnosis not present

## 2014-11-22 DIAGNOSIS — G931 Anoxic brain damage, not elsewhere classified: Secondary | ICD-10-CM | POA: Diagnosis not present

## 2014-11-22 DIAGNOSIS — J45909 Unspecified asthma, uncomplicated: Secondary | ICD-10-CM | POA: Diagnosis present

## 2014-11-22 DIAGNOSIS — Z7951 Long term (current) use of inhaled steroids: Secondary | ICD-10-CM

## 2014-11-22 DIAGNOSIS — Z4682 Encounter for fitting and adjustment of non-vascular catheter: Secondary | ICD-10-CM | POA: Diagnosis not present

## 2014-11-22 DIAGNOSIS — G311 Senile degeneration of brain, not elsewhere classified: Secondary | ICD-10-CM | POA: Diagnosis not present

## 2014-11-22 DIAGNOSIS — E872 Acidosis: Secondary | ICD-10-CM | POA: Diagnosis present

## 2014-11-22 DIAGNOSIS — R0989 Other specified symptoms and signs involving the circulatory and respiratory systems: Secondary | ICD-10-CM | POA: Diagnosis not present

## 2014-11-22 DIAGNOSIS — R000 Tachycardia, unspecified: Secondary | ICD-10-CM | POA: Diagnosis present

## 2014-11-22 DIAGNOSIS — R739 Hyperglycemia, unspecified: Secondary | ICD-10-CM | POA: Diagnosis present

## 2014-11-22 DIAGNOSIS — R4020 Unspecified coma: Secondary | ICD-10-CM

## 2014-11-22 DIAGNOSIS — R072 Precordial pain: Secondary | ICD-10-CM

## 2014-11-22 DIAGNOSIS — R258 Other abnormal involuntary movements: Secondary | ICD-10-CM | POA: Diagnosis not present

## 2014-11-22 DIAGNOSIS — Y95 Nosocomial condition: Secondary | ICD-10-CM | POA: Diagnosis present

## 2014-11-22 DIAGNOSIS — E876 Hypokalemia: Secondary | ICD-10-CM | POA: Diagnosis not present

## 2014-11-22 DIAGNOSIS — J45901 Unspecified asthma with (acute) exacerbation: Secondary | ICD-10-CM | POA: Diagnosis not present

## 2014-11-22 DIAGNOSIS — Z90710 Acquired absence of both cervix and uterus: Secondary | ICD-10-CM

## 2014-11-22 DIAGNOSIS — Z7952 Long term (current) use of systemic steroids: Secondary | ICD-10-CM | POA: Diagnosis not present

## 2014-11-22 DIAGNOSIS — I10 Essential (primary) hypertension: Secondary | ICD-10-CM | POA: Diagnosis present

## 2014-11-22 DIAGNOSIS — R570 Cardiogenic shock: Secondary | ICD-10-CM | POA: Diagnosis not present

## 2014-11-22 DIAGNOSIS — R7989 Other specified abnormal findings of blood chemistry: Secondary | ICD-10-CM | POA: Diagnosis not present

## 2014-11-22 DIAGNOSIS — R4182 Altered mental status, unspecified: Secondary | ICD-10-CM | POA: Diagnosis not present

## 2014-11-22 DIAGNOSIS — R40243 Glasgow coma scale score 3-8: Secondary | ICD-10-CM | POA: Diagnosis not present

## 2014-11-22 DIAGNOSIS — Z66 Do not resuscitate: Secondary | ICD-10-CM | POA: Diagnosis not present

## 2014-11-22 DIAGNOSIS — I469 Cardiac arrest, cause unspecified: Secondary | ICD-10-CM | POA: Diagnosis not present

## 2014-11-22 DIAGNOSIS — Z87891 Personal history of nicotine dependence: Secondary | ICD-10-CM | POA: Diagnosis not present

## 2014-11-22 DIAGNOSIS — E875 Hyperkalemia: Secondary | ICD-10-CM | POA: Diagnosis present

## 2014-11-22 DIAGNOSIS — Z789 Other specified health status: Secondary | ICD-10-CM

## 2014-11-22 DIAGNOSIS — R0602 Shortness of breath: Secondary | ICD-10-CM | POA: Diagnosis not present

## 2014-11-22 DIAGNOSIS — R0600 Dyspnea, unspecified: Secondary | ICD-10-CM | POA: Diagnosis not present

## 2014-11-22 DIAGNOSIS — J189 Pneumonia, unspecified organism: Secondary | ICD-10-CM | POA: Diagnosis not present

## 2014-11-22 DIAGNOSIS — F0390 Unspecified dementia without behavioral disturbance: Secondary | ICD-10-CM | POA: Diagnosis present

## 2014-11-22 DIAGNOSIS — I739 Peripheral vascular disease, unspecified: Secondary | ICD-10-CM | POA: Diagnosis not present

## 2014-11-22 DIAGNOSIS — J9601 Acute respiratory failure with hypoxia: Secondary | ICD-10-CM | POA: Diagnosis not present

## 2014-11-22 DIAGNOSIS — Z978 Presence of other specified devices: Secondary | ICD-10-CM

## 2014-11-22 DIAGNOSIS — N179 Acute kidney failure, unspecified: Secondary | ICD-10-CM | POA: Diagnosis present

## 2014-11-22 DIAGNOSIS — Z7982 Long term (current) use of aspirin: Secondary | ICD-10-CM | POA: Diagnosis not present

## 2014-11-22 DIAGNOSIS — R0682 Tachypnea, not elsewhere classified: Secondary | ICD-10-CM

## 2014-11-22 DIAGNOSIS — J9600 Acute respiratory failure, unspecified whether with hypoxia or hypercapnia: Secondary | ICD-10-CM

## 2014-11-22 DIAGNOSIS — R079 Chest pain, unspecified: Secondary | ICD-10-CM | POA: Diagnosis not present

## 2014-11-22 DIAGNOSIS — F17220 Nicotine dependence, chewing tobacco, uncomplicated: Secondary | ICD-10-CM | POA: Diagnosis present

## 2014-11-22 DIAGNOSIS — J181 Lobar pneumonia, unspecified organism: Secondary | ICD-10-CM | POA: Diagnosis not present

## 2014-11-22 DIAGNOSIS — J690 Pneumonitis due to inhalation of food and vomit: Secondary | ICD-10-CM | POA: Diagnosis present

## 2014-11-22 DIAGNOSIS — I2699 Other pulmonary embolism without acute cor pulmonale: Secondary | ICD-10-CM | POA: Diagnosis not present

## 2014-11-22 LAB — CBC WITH DIFFERENTIAL/PLATELET
BASOS ABS: 0 10*3/uL (ref 0.0–0.1)
Basophils Relative: 0 % (ref 0–1)
EOS ABS: 0.2 10*3/uL (ref 0.0–0.7)
Eosinophils Relative: 1 % (ref 0–5)
HEMATOCRIT: 30.7 % — AB (ref 36.0–46.0)
Hemoglobin: 9.5 g/dL — ABNORMAL LOW (ref 12.0–15.0)
LYMPHS ABS: 3.3 10*3/uL (ref 0.7–4.0)
Lymphocytes Relative: 18 % (ref 12–46)
MCH: 22.7 pg — ABNORMAL LOW (ref 26.0–34.0)
MCHC: 30.9 g/dL (ref 30.0–36.0)
MCV: 73.3 fL — ABNORMAL LOW (ref 78.0–100.0)
MONOS PCT: 5 % (ref 3–12)
Monocytes Absolute: 0.9 10*3/uL (ref 0.1–1.0)
NEUTROS ABS: 13.7 10*3/uL — AB (ref 1.7–7.7)
Neutrophils Relative %: 76 % (ref 43–77)
PLATELETS: 331 10*3/uL (ref 150–400)
RBC: 4.19 MIL/uL (ref 3.87–5.11)
RDW: 14.8 % (ref 11.5–15.5)
WBC: 18.1 10*3/uL — AB (ref 4.0–10.5)

## 2014-11-22 LAB — BASIC METABOLIC PANEL
Anion gap: 8 (ref 5–15)
BUN: 21 mg/dL — ABNORMAL HIGH (ref 6–20)
CO2: 25 mmol/L (ref 22–32)
Calcium: 9.5 mg/dL (ref 8.9–10.3)
Chloride: 110 mmol/L (ref 101–111)
Creatinine, Ser: 1.3 mg/dL — ABNORMAL HIGH (ref 0.44–1.00)
GFR calc Af Amer: 43 mL/min — ABNORMAL LOW (ref >60)
GFR, EST NON AFRICAN AMERICAN: 37 mL/min — AB (ref >60)
GLUCOSE: 99 mg/dL (ref 65–99)
Potassium: 4 mmol/L (ref 3.5–5.1)
SODIUM: 143 mmol/L (ref 135–145)

## 2014-11-22 LAB — CBC
HCT: 33 % — ABNORMAL LOW (ref 36.0–46.0)
Hemoglobin: 10.2 g/dL — ABNORMAL LOW (ref 12.0–15.0)
MCH: 22.2 pg — AB (ref 26.0–34.0)
MCHC: 30.9 g/dL (ref 30.0–36.0)
MCV: 71.9 fL — AB (ref 78.0–100.0)
Platelets: 288 10*3/uL (ref 150–400)
RBC: 4.59 MIL/uL (ref 3.87–5.11)
RDW: 14.7 % (ref 11.5–15.5)
WBC: 12.8 10*3/uL — ABNORMAL HIGH (ref 4.0–10.5)

## 2014-11-22 LAB — URINALYSIS, ROUTINE W REFLEX MICROSCOPIC
BILIRUBIN URINE: NEGATIVE
Glucose, UA: 100 mg/dL — AB
Ketones, ur: NEGATIVE mg/dL
Leukocytes, UA: NEGATIVE
Nitrite: NEGATIVE
PROTEIN: 100 mg/dL — AB
Specific Gravity, Urine: 1.022 (ref 1.005–1.030)
UROBILINOGEN UA: 0.2 mg/dL (ref 0.0–1.0)
pH: 6 (ref 5.0–8.0)

## 2014-11-22 LAB — I-STAT CG4 LACTIC ACID, ED: Lactic Acid, Venous: 11.27 mmol/L (ref 0.5–2.0)

## 2014-11-22 LAB — I-STAT ARTERIAL BLOOD GAS, ED
Acid-base deficit: 8 mmol/L — ABNORMAL HIGH (ref 0.0–2.0)
Bicarbonate: 15.7 mEq/L — ABNORMAL LOW (ref 20.0–24.0)
O2 SAT: 96 %
TCO2: 16 mmol/L (ref 0–100)
pCO2 arterial: 25.6 mmHg — ABNORMAL LOW (ref 35.0–45.0)
pH, Arterial: 7.39 (ref 7.350–7.450)
pO2, Arterial: 76 mmHg — ABNORMAL LOW (ref 80.0–100.0)

## 2014-11-22 LAB — COMPREHENSIVE METABOLIC PANEL
ALBUMIN: 3 g/dL — AB (ref 3.5–5.0)
ALT: 39 U/L (ref 14–54)
ANION GAP: 21 — AB (ref 5–15)
AST: 55 U/L — AB (ref 15–41)
Alkaline Phosphatase: 64 U/L (ref 38–126)
BILIRUBIN TOTAL: 0.6 mg/dL (ref 0.3–1.2)
BUN: 25 mg/dL — ABNORMAL HIGH (ref 6–20)
CHLORIDE: 107 mmol/L (ref 101–111)
CO2: 11 mmol/L — ABNORMAL LOW (ref 22–32)
CREATININE: 1.79 mg/dL — AB (ref 0.44–1.00)
Calcium: 9 mg/dL (ref 8.9–10.3)
GFR calc Af Amer: 29 mL/min — ABNORMAL LOW (ref >60)
GFR, EST NON AFRICAN AMERICAN: 25 mL/min — AB (ref >60)
Glucose, Bld: 266 mg/dL — ABNORMAL HIGH (ref 65–99)
Potassium: 6.5 mmol/L (ref 3.5–5.1)
Sodium: 139 mmol/L (ref 135–145)
Total Protein: 6.2 g/dL — ABNORMAL LOW (ref 6.5–8.1)

## 2014-11-22 LAB — URINE MICROSCOPIC-ADD ON

## 2014-11-22 LAB — CBG MONITORING, ED: GLUCOSE-CAPILLARY: 223 mg/dL — AB (ref 65–99)

## 2014-11-22 LAB — HEMOGLOBIN A1C
Hgb A1c MFr Bld: 6.4 % — ABNORMAL HIGH (ref 4.8–5.6)
Mean Plasma Glucose: 137 mg/dL

## 2014-11-22 LAB — TROPONIN I: Troponin I: 0.07 ng/mL — ABNORMAL HIGH (ref <0.031)

## 2014-11-22 MED ORDER — SODIUM CHLORIDE 0.9 % IV BOLUS (SEPSIS)
1000.0000 mL | Freq: Once | INTRAVENOUS | Status: AC
  Administered 2014-11-22: 3000 mL via INTRAVENOUS

## 2014-11-22 MED ORDER — HEPARIN SODIUM (PORCINE) 5000 UNIT/ML IJ SOLN
5000.0000 [IU] | Freq: Three times a day (TID) | INTRAMUSCULAR | Status: DC
  Administered 2014-11-23 – 2014-11-27 (×14): 5000 [IU] via SUBCUTANEOUS
  Filled 2014-11-22 (×19): qty 1

## 2014-11-22 MED ORDER — ONDANSETRON HCL 4 MG/2ML IJ SOLN
INTRAMUSCULAR | Status: AC
  Filled 2014-11-22: qty 2

## 2014-11-22 MED ORDER — SODIUM CHLORIDE 0.9 % IV SOLN
1.0000 g | Freq: Once | INTRAVENOUS | Status: AC
  Administered 2014-11-23: 1 g via INTRAVENOUS
  Filled 2014-11-22: qty 10

## 2014-11-22 MED ORDER — SODIUM BICARBONATE 8.4 % IV SOLN
50.0000 meq | Freq: Once | INTRAVENOUS | Status: AC
  Administered 2014-11-23: 50 meq via INTRAVENOUS

## 2014-11-22 MED ORDER — SODIUM CHLORIDE 0.9 % IV SOLN
1.0000 ug/kg/min | INTRAVENOUS | Status: DC
  Administered 2014-11-23 (×3): 1 ug/kg/min via INTRAVENOUS
  Filled 2014-11-22 (×2): qty 20

## 2014-11-22 MED ORDER — FENTANYL CITRATE (PF) 100 MCG/2ML IJ SOLN: 50.0000 ug | Freq: Once | INTRAMUSCULAR | Status: DC

## 2014-11-22 MED ORDER — FENTANYL BOLUS VIA INFUSION
25.0000 ug | INTRAVENOUS | Status: DC | PRN
  Filled 2014-11-22: qty 25

## 2014-11-22 MED ORDER — ALBUTEROL SULFATE HFA 108 (90 BASE) MCG/ACT IN AERS: 2.0000 | INHALATION_SPRAY | Freq: Four times a day (QID) | RESPIRATORY_TRACT | Status: AC | PRN

## 2014-11-22 MED ORDER — IOHEXOL 350 MG/ML SOLN
80.0000 mL | Freq: Once | INTRAVENOUS | Status: AC | PRN
  Administered 2014-11-22: 80 mL via INTRAVENOUS

## 2014-11-22 MED ORDER — SODIUM CHLORIDE 0.9 % IV SOLN: 2000.0000 mL | Freq: Once | INTRAVENOUS | Status: DC

## 2014-11-22 MED ORDER — ONDANSETRON HCL 4 MG/2ML IJ SOLN
4.0000 mg | Freq: Once | INTRAMUSCULAR | Status: AC
  Administered 2014-11-22: 4 mg via INTRAVENOUS

## 2014-11-22 MED ORDER — PREDNISONE 50 MG PO TABS
50.0000 mg | ORAL_TABLET | Freq: Every day | ORAL | Status: AC
Start: 2014-11-22 — End: 2014-11-25

## 2014-11-22 MED ORDER — PROPOFOL 1000 MG/100ML IV EMUL
5.0000 ug/kg/min | INTRAVENOUS | Status: DC
  Administered 2014-11-23: 50 ug/kg/min via INTRAVENOUS
  Administered 2014-11-23: 60 ug/kg/min via INTRAVENOUS
  Filled 2014-11-22 (×2): qty 100

## 2014-11-22 MED ORDER — SODIUM CHLORIDE 0.9 % IV SOLN
25.0000 ug/h | INTRAVENOUS | Status: DC
  Administered 2014-11-23: 200 ug/h via INTRAVENOUS
  Administered 2014-11-23: 100 ug/h via INTRAVENOUS
  Administered 2014-11-24: 200 ug/h via INTRAVENOUS
  Filled 2014-11-22 (×3): qty 50

## 2014-11-22 MED ORDER — CISATRACURIUM BOLUS VIA INFUSION
0.1000 mg/kg | Freq: Once | INTRAVENOUS | Status: AC
  Administered 2014-11-22: 5.1 mg via INTRAVENOUS
  Filled 2014-11-22: qty 6

## 2014-11-22 MED ORDER — IPRATROPIUM-ALBUTEROL 0.5-2.5 (3) MG/3ML IN SOLN: 3.0000 mL | RESPIRATORY_TRACT | Status: DC | PRN

## 2014-11-22 MED ORDER — METOPROLOL TARTRATE 12.5 MG HALF TABLET
12.5000 mg | ORAL_TABLET | Freq: Two times a day (BID) | ORAL | Status: DC
  Administered 2014-11-22: 12.5 mg via ORAL
  Filled 2014-11-22 (×2): qty 1

## 2014-11-22 MED ORDER — PROPOFOL 1000 MG/100ML IV EMUL
INTRAVENOUS | Status: AC
  Administered 2014-11-22: 1000 mg
  Filled 2014-11-22: qty 100

## 2014-11-22 MED ORDER — ARTIFICIAL TEARS OP OINT
1.0000 "application " | TOPICAL_OINTMENT | Freq: Three times a day (TID) | OPHTHALMIC | Status: DC
  Administered 2014-11-23 – 2014-11-25 (×8): 1 via OPHTHALMIC
  Filled 2014-11-22 (×3): qty 3.5

## 2014-11-22 MED ORDER — NOREPINEPHRINE BITARTRATE 1 MG/ML IV SOLN
0.0000 ug/min | INTRAVENOUS | Status: DC
  Administered 2014-11-23: 8 ug/min via INTRAVENOUS
  Administered 2014-11-23: 5 ug/min via INTRAVENOUS
  Administered 2014-11-24: 10 ug/min via INTRAVENOUS
  Administered 2014-11-24: 16 ug/min via INTRAVENOUS
  Filled 2014-11-22 (×4): qty 4

## 2014-11-22 MED ORDER — PIPERACILLIN-TAZOBACTAM 3.375 G IVPB 30 MIN
3.3750 g | Freq: Once | INTRAVENOUS | Status: AC
  Administered 2014-11-22: 3.375 g via INTRAVENOUS
  Filled 2014-11-22: qty 50

## 2014-11-22 MED ORDER — VANCOMYCIN HCL IN DEXTROSE 1-5 GM/200ML-% IV SOLN
1000.0000 mg | Freq: Once | INTRAVENOUS | Status: AC
  Administered 2014-11-22: 1000 mg via INTRAVENOUS
  Filled 2014-11-22: qty 200

## 2014-11-22 MED ORDER — CISATRACURIUM BOLUS VIA INFUSION
0.0500 mg/kg | INTRAVENOUS | Status: DC | PRN
  Filled 2014-11-22: qty 3

## 2014-11-22 MED ORDER — CARVEDILOL 3.125 MG PO TABS
3.1250 mg | ORAL_TABLET | Freq: Two times a day (BID) | ORAL | Status: DC
  Filled 2014-11-22 (×3): qty 1

## 2014-11-22 MED ORDER — FENTANYL CITRATE (PF) 100 MCG/2ML IJ SOLN
50.0000 ug | Freq: Once | INTRAMUSCULAR | Status: AC
  Administered 2014-11-22: 50 ug via INTRAVENOUS
  Filled 2014-11-22: qty 2

## 2014-11-22 MED ORDER — DILTIAZEM HCL ER COATED BEADS 120 MG PO CP24: 120.0000 mg | ORAL_CAPSULE | Freq: Every day | ORAL | Status: AC

## 2014-11-22 MED ORDER — ASPIRIN 300 MG RE SUPP
300.0000 mg | RECTAL | Status: AC
  Administered 2014-11-23: 300 mg via RECTAL
  Filled 2014-11-22: qty 1

## 2014-11-22 NOTE — ED Notes (Signed)
Cardiology has been paged for Crit.Care to [phone].

## 2014-11-22 NOTE — Progress Notes (Signed)
Pt transported to CT without complications  

## 2014-11-22 NOTE — ED Provider Notes (Signed)
CSN: [id number]     Arrival date & time 12/04/2014  1923 History   First MD Initiated Contact with Patient 12/01/2014 1931     No chief complaint on file.    (Consider location/radiation/quality/duration/timing/severity/associated sxs/prior Treatment) Patient is a 81 y.o. female presenting with general illness. The history is provided by the EMS personnel.  Illness Location:  Global Quality:  Sudden onset cardiac arrest and unresponsiveness Severity:  Severe Onset quality:  Sudden Duration:  1 hour Timing:  Constant Context:  Found down after last seen normal 5 minutes previously, had cardiac arrest that was asystole on EMS arrival responsive to epinephrine and chest compressions Associated symptoms: cough (ongoing respiratory illness per family who provided history) and wheezing (Per chart review)   Associated symptoms: no abdominal pain and no fever     Past Medical History  Diagnosis Date  . Alcohol abuse   . Brain tumor   . Brain aneurysm   . Arthritis   . Hypertension   . Constipation     last colonoscopy 2011  . Colon polyps 06/07/2009  . Dementia   . Asthma   . Shortness of breath dyspnea   . Refusal of blood transfusions as patient is Jehovah's Witness    Past Surgical History  Procedure Laterality Date  . Brain surgery    . Abdominal hysterectomy      DUB; removed B ovaries.   Family History  Problem Relation Age of Onset  . Cancer Mother   . Heart disease Father    History  Substance Use Topics  . Smoking status: Former Research scientist (life sciences)  . Smokeless tobacco: Former Systems developer    Types: Snuff  . Alcohol Use: Yes     Comment: Pint of bourbon per day - none for 4 days - 05/21/14   quit drinking march 2016   OB History    No data available     Review of Systems  Constitutional: Negative for fever.  Respiratory: Positive for cough (ongoing respiratory illness per family who provided history) and wheezing (Per chart review).   Gastrointestinal: Negative for abdominal pain.   All other systems reviewed and are negative.     Allergies  Fexofenadine  Home Medications   Prior to Admission medications   Medication Sig Start Date End Date Taking? Authorizing Provider  albuterol (PROVENTIL HFA;VENTOLIN HFA) 108 (90 BASE) MCG/ACT inhaler Inhale 2 puffs into the lungs every 6 (six) hours as needed for wheezing or shortness of breath. 11/12/2014  Yes Ashly M Gottschalk, DO  albuterol (PROVENTIL) (2.5 MG/3ML) 0.083% nebulizer solution Take 3 mLs (2.5 mg total) by nebulization every 6 (six) hours as needed for wheezing or shortness of breath. 10/30/14  Yes [name], MD  aspirin 325 MG tablet Take 325 mg by mouth once.   Yes Historical Provider, MD  budesonide-formoterol (SYMBICORT) 160-4.5 MCG/ACT inhaler Inhale 2 puffs into the lungs 2 (two) times daily. 10/30/14  Yes [name], MD  clonazePAM (KLONOPIN) 0.5 MG tablet Take 1 tablet (0.5 mg total) by mouth every 6 (six) hours as needed for anxiety. 07/13/14  Yes Roselee Culver, MD  diltiazem (CARDIZEM CD) 120 MG 24 hr capsule Take 1 capsule (120 mg total) by mouth daily. 11/13/2014  Yes Ashly Windell Moulding, DO  docusate sodium (COLACE) 100 MG capsule Take 100 mg by mouth daily.    Yes Historical Provider, MD  feeding supplement, ENSURE COMPLETE, (ENSURE COMPLETE) LIQD Take 237 mLs by mouth 2 (two) times daily between meals. 05/27/14  Yes Eugenie Filler, MD  fluticasone [facility] [place]) 50 MCG/ACT nasal spray Place 2 sprays into both nostrils daily. 10/30/14  Yes [name], MD  loratadine (CLARITIN) 10 MG tablet Take 10 mg by mouth once.   Yes Historical Provider, MD  losartan (COZAAR) 50 MG tablet Take 1 tablet (50 mg total) by mouth daily. 07/13/14  Yes Roselee Culver, MD  Magnesium Hydroxide (MILK OF MAGNESIA PO) Take 1 each by mouth daily as needed (constipation).   Yes Historical Provider, MD  metoprolol tartrate (LOPRESSOR) 25 MG tablet Take 12.5 mg by mouth 2 (two) times daily.   Yes Historical Provider, MD   mirtazapine (REMERON) 15 MG tablet Take 1 tablet (15 mg total) by mouth at bedtime. 07/13/14  Yes Roselee Culver, MD  pantoprazole (PROTONIX) 40 MG tablet Take 1 tablet (40 mg total) by mouth daily. 07/13/14  Yes Roselee Culver, MD  predniSONE (DELTASONE) 50 MG tablet Take 1 tablet (50 mg total) by mouth daily with breakfast. 11/24/2014 11/25/14 Yes Ashly Windell Moulding, DO  Thiamine HCl (VITAMIN B-1) 250 MG tablet Take 125 mg by mouth daily.   Yes Historical Provider, MD   BP 153/67 mmHg  Pulse 65  Temp(Src) 89.1 F (31.7 C) (Core (Comment))  Resp 24  Ht 4\' 5"  (1.346 m)  Wt 120 lb 13 oz (54.8 kg)  BMI 30.25 kg/m2  SpO2 100% Physical Exam  Constitutional: She appears well-developed. She has a sickly appearance. She is intubated. Cervical collar and backboard in place.  HENT:  Head: Normocephalic.  Eyes: Conjunctivae are normal.  Neck: Neck supple. No tracheal deviation present.  Cardiovascular: Regular rhythm.  Tachycardia present.   Pulmonary/Chest: Bradypnea noted. She is intubated.  Initially right side greater than left side breath sounds, improved after retraction of the ET tube  Abdominal: Soft. She exhibits no distension.  Neurological: She is unresponsive. GCS eye subscore is 1. GCS verbal subscore is 1. GCS motor subscore is 1.  Intubated with some breathing over the ventilator, no volitional movement  Skin: Skin is warm and dry. There is pallor.    ED Course  Procedures (including critical care time) Labs Review Labs Reviewed  CBC WITH DIFFERENTIAL/PLATELET - Abnormal; Notable for the following:    WBC 18.1 (*)    Hemoglobin 9.5 (*)    HCT 30.7 (*)    MCV 73.3 (*)    MCH 22.7 (*)    Neutro Abs 13.7 (*)    All other components within normal limits  COMPREHENSIVE METABOLIC PANEL - Abnormal; Notable for the following:    Potassium 6.5 (*)    CO2 11 (*)    Glucose, Bld 266 (*)    BUN 25 (*)    Creatinine, Ser 1.79 (*)    Total Protein 6.2 (*)    Albumin 3.0 (*)     AST 55 (*)    GFR calc non Af Amer 25 (*)    GFR calc Af Amer 29 (*)    Anion gap 21 (*)    All other components within normal limits  TROPONIN I - Abnormal; Notable for the following:    Troponin I 0.07 (*)    All other components within normal limits  URINALYSIS, ROUTINE W REFLEX MICROSCOPIC (NOT AT [facility]) - Abnormal; Notable for the following:    Glucose, UA 100 (*)    Hgb urine dipstick MODERATE (*)    Protein, ur 100 (*)    All other components within normal limits  I-STAT  CG4 LACTIC ACID, ED - Abnormal; Notable for the following:    Lactic Acid, Venous 11.27 (*)    All other components within normal limits  CBG MONITORING, ED - Abnormal; Notable for the following:    Glucose-Capillary 223 (*)    All other components within normal limits  I-STAT ARTERIAL BLOOD GAS, ED - Abnormal; Notable for the following:    pCO2 arterial 25.6 (*)    pO2, Arterial 76.0 (*)    Bicarbonate 15.7 (*)    Acid-base deficit 8.0 (*)    All other components within normal limits  CULTURE, BLOOD (ROUTINE X 2)  CULTURE, BLOOD (ROUTINE X 2)  URINE CULTURE  MRSA PCR SCREENING  URINE MICROSCOPIC-ADD ON  BLOOD GAS, ARTERIAL  TROPONIN I  TROPONIN I  TROPONIN I  BASIC METABOLIC PANEL  BASIC METABOLIC PANEL  BASIC METABOLIC PANEL  BASIC METABOLIC PANEL  BASIC METABOLIC PANEL  BASIC METABOLIC PANEL  PROTIME-INR  PROTIME-INR  APTT  APTT  CBC  CBC  BASIC METABOLIC PANEL  BLOOD GAS, ARTERIAL  TROPONIN I  BASIC METABOLIC PANEL  BASIC METABOLIC PANEL  BASIC METABOLIC PANEL  BASIC METABOLIC PANEL  BASIC METABOLIC PANEL    Imaging Review Ct Head Wo Contrast  11/29/2014   CLINICAL DATA:  Status post cardiopulmonary resuscitation. Altered mental status/ syncope  EXAM: CT HEAD WITHOUT CONTRAST  TECHNIQUE: Contiguous axial images were obtained from the base of the skull through the vertex without intravenous contrast.  COMPARISON:  Head CT May 23, 2014; brain MRI May 24, 2014  FINDINGS:  There is mild diffuse atrophy, stable. There is no intracranial mass, hemorrhage, extra-axial fluid collection, or midline shift. There is mild patchy small vessel disease in the centra semiovale bilaterally. There is no new gray-white compartment lesion. No acute infarct evident.  The patient is status post previous right frontal and temporal craniotomy. Bony calvarium otherwise appears intact. Mastoid air cells are clear. There is extensive debris throughout the nasopharynx. There is an air-fluid level in the left maxillary antrum. There are air-fluid levels in each maxillary antrum. Most of the ethmoid air cells are opacified. There is a stable bony defect in the lamina papyracea on the left.  IMPRESSION: Atrophy with mild stable periventricular small vessel disease. No intracranial mass, hemorrhage, or acute appearing infarct. Bony defect on the left from previous craniotomy. Extensive paranasal sinus disease as well as diffuse opacification of the nares and visualize nasopharynx. Question hemorrhage in these areas given the recent CPR.   Electronically Signed   By: Lowella Grip III M.D.   On: 11/16/2014 20:42   Ct Angio Chest Pe W/cm &/or Wo Cm  11/18/2014   CLINICAL DATA:  Post CPR, cardiac arrest  EXAM: CT ANGIOGRAPHY CHEST WITH CONTRAST  TECHNIQUE: Multidetector CT imaging of the chest was performed using the standard protocol during bolus administration of intravenous contrast. Multiplanar CT image reconstructions and MIPs were obtained to evaluate the vascular anatomy.  CONTRAST:  33mL OMNIPAQUE IOHEXOL 350 MG/ML SOLN  COMPARISON:  Chest radiograph same date  FINDINGS: Mediastinum/Nodes: Endotracheal tube tip is within the right mainstem bronchus. Nasogastric tube in place with fluid within the esophagus or diffuse esophageal wall thickening, unable to further differentiate given technique at the current study. Heart size is normal. No pericardial effusion. Moderate atheromatous aortic and coronary  arterial calcification.  The examination is adequate for evaluation for acute pulmonary embolism up to and including the 3rd order pulmonary arteries. No focal filling defect is identified up to  and including the 3rd order pulmonary arteries to suggest acute pulmonary embolism.  Lungs/Pleura: Right upper lobe consolidation as well as areas of curvilinear scarring or atelectasis noted. Subpleural bilateral lower lobe atelectasis or scarring noted. No measurable mass, nodule, or consolidation is otherwise identified. No pleural effusion.  Upper abdomen: Right upper renal pole cyst measures 5.5 cm image 80. Nasogastric tube tip terminates at least at the distal stomach but is not included in the field of view. Adrenal glands are normal in their visualized aspects.  Musculoskeletal: No acute osseous abnormality.  Review of the MIP images confirms the above findings.  IMPRESSION: No CT evidence for acute pulmonary embolism.  Patchy right upper lobe consolidation which may indicate pneumonia although aspiration or other alveolar filling processes could appear similar.  Right mainstem bronchus intubation. Consider pulling back endotracheal tube 3-4 cm for more optimal positioning.  Fluid filled or thick-walled esophagus.  Critical Value/emergent results were called by telephone at the time of interpretation on 11/30/2014 at 8:46 pm to RN Raquel Sarna who verbally acknowledged these results.   Electronically Signed   By: Conchita Paris M.D.   On: 11/25/2014 20:47   Dg Chest Port 1 View  11/18/2014   CLINICAL DATA:  Cardiac arrest.  Intubated patient  EXAM: PORTABLE CHEST - 1 VIEW  COMPARISON:  Radiograph 11/20/2014  FINDINGS: Endotracheal tube position all at the level the carina and directed towards the right mainstem bronchus. Consider retraction by 3 to 4 cm. Normal cardiac silhouette. No pulmonary edema. No pleural fluid. No pneumothorax.  IMPRESSION: 1. Endotracheal tube is essentially at the carina. Consider retraction by  3 to 4 cm. 2. No atelectasis, edema, or infiltrate.   Electronically Signed   By: Suzy Bouchard M.D.   On: 11/18/2014 19:43     EKG Interpretation None      MDM   Final diagnoses:  HCAP (healthcare-associated pneumonia)    81 year old female presents status post cardiac arrest in the field where she was found down after 5 minutes and EMS was called to find the patient in asystole. She had previously been admitted to this facility for an apparent respiratory illness which had been improving. She was persistently tachycardic throughout her admission. She has risk factors for PE and intracranial hemorrhage as she has had a recent admission and a prior brain aneurysm.  CT scan shows no intracranial hemorrhage, CTA of the chest to evaluate for PE shows no embolus but a possible HCAP. She is covered with broad-spectrum antibiotics after cultures were drawn. Bedside ultrasound initially showed a collapsible IVC and the patient was fluid resuscitated with good response of both her IVC filling and heart rate. She remained on the ventilator and sedated on propofol and was breathing over the vent and moving spontaneously, intermittently opening her eyes demonstrating possible salvageable neurologic activity. Critical care was consulted to admit the patient for possible therapeutic hypothermia, vent weaning and further workup.    Leo Grosser, MD 11/23/14 0160  Wandra Arthurs, MD 11/25/14 1435

## 2014-11-22 NOTE — Progress Notes (Signed)
This admission has been reviewed and determined not to meet inpatient level of care. Both attending Physician and Medical Director are in agreement this should be an Observation encounter according to the Medicare Conditions of Participation as set forth in CFR 42 Chapter 456 482.12 (c) and the Medicare Condition Code-44 Regulations CFR 42 Chapter 100 - 04 50.3. The Patient and/or Patient Representative was notified via delivery of the "MEDICARE OBSERVATION STATUS NOTIFICATION".              

## 2014-11-22 NOTE — Discharge Instructions (Signed)
You were admitted for an asthma exacerbation.  You were treated with inhalers and prednisone.  You also had an echocardiogram done.  You should follow up outpatient with cardiology.  You also had a small decrease in your kidney function.  This will resolve with drinking of fluids.  You should also STOP taking Meloxicam.  This hurts the kidneys.  Follow up with your primary physician.  She should schedule outpatient pulmonary function tests for you.  You should also discuss restarting a cholesterol medication with her.  Laura May September 23, 1931  Follow-up Information    Follow up with Jenkins Rouge, MD.   Specialty:  Cardiology   Why:  The office will call with information on stress testing and a follow-up appointment.   Contact information:   [address] [place] [address] [phone]       Follow up with Reginia Forts, MD. Schedule an appointment as soon as possible for a visit in 1 week.   Specialty:  Family Medicine   Why:  hospital follow up   Contact information:   [address] [place] [address] [phone]       Remember! Always use a spacer with your metered dose inhaler! GREEN = GO!                                   Use these medications every day!  - Breathing is good  - No cough or wheeze day or night  - Can work, sleep, exercise  Rinse your mouth after inhalers as directed Symbicort 2 puffs twice daily Use 15 minutes before exercise or trigger exposure  Albuterol (Proventil, Ventolin, Proair) 2 puffs as needed every 4 hours    YELLOW = asthma out of control   Continue to use Green Zone medicines & add:  - Cough or wheeze  - Tight chest  - Short of breath  - Difficulty breathing  - First sign of a cold (be aware of your symptoms)  Call for advice as you need to.  Quick Relief Medicine:Albuterol (Proventil, Ventolin, Proair) 2 puffs as needed every 4 hours If you improve within 20 minutes, continue to use every 4 hours as needed until  completely well. Call if you are not better in 2 days or you want more advice.  If no improvement in 15-20 minutes, repeat quick relief medicine every 20 minutes for 2 more treatments (for a maximum of 3 total treatments in 1 hour). If improved continue to use every 4 hours and CALL for advice.  If not improved or you are getting worse, follow Red Zone plan.  Special Instructions:   RED = DANGER                                Get help from a doctor now!  - Albuterol not helping or not lasting 4 hours  - Frequent, severe cough  - Getting worse instead of better  - Ribs or neck muscles show when breathing in  - Hard to walk and talk  - Lips or fingernails turn blue TAKE: Albuterol 8 puffs of inhaler with spacer If breathing is better within 15 minutes, repeat emergency medicine every 15 minutes for 2 more doses. YOU MUST CALL FOR ADVICE NOW!   STOP! MEDICAL ALERT!  If still in Red (Danger) zone after 15 minutes this could  be a life-threatening emergency. Take second dose of quick relief medicine  AND  Go to the Emergency Room or call 911  If you have trouble walking or talking, are gasping for air, or have blue lips or fingernails, CALL 911!I   Environmental Control and Control of other Triggers  Allergens  Animal Dander Some people are allergic to the flakes of skin or dried saliva from animals with fur or feathers. The best thing to do:  Keep furred or feathered pets out of your home.   If you cant keep the pet outdoors, then:  Keep the pet out of your bedroom and other sleeping areas at all times, and keep the door closed. SCHEDULE FOLLOW-UP APPOINTMENT WITHIN 3-5 DAYS OR FOLLOWUP ON DATE PROVIDED IN YOUR DISCHARGE INSTRUCTIONS *Do not delete this statement*  Remove carpets and furniture covered with cloth from your home.   If that is not possible, keep the pet away from fabric-covered furniture   and carpets.  Dust Mites Many people with asthma are allergic to dust mites.  Dust mites are tiny bugs that are found in every home--in mattresses, pillows, carpets, upholstered furniture, bedcovers, clothes, stuffed toys, and fabric or other fabric-covered items. Things that can help:  Encase your mattress in a special dust-proof cover.  Encase your pillow in a special dust-proof cover or wash the pillow each week in hot water. Water must be hotter than 130 F to kill the mites. Cold or warm water used with detergent and bleach can also be effective.  Wash the sheets and blankets on your bed each week in hot water.  Reduce indoor humidity to below 60 percent (ideally between 30--50 percent). Dehumidifiers or central air conditioners can do this.  Try not to sleep or lie on cloth-covered cushions.  Remove carpets from your bedroom and those laid on concrete, if you can.  Keep stuffed toys out of the bed or wash the toys weekly in hot water or   cooler water with detergent and bleach.  Cockroaches Many people with asthma are allergic to the dried droppings and remains of cockroaches. The best thing to do:  Keep food and garbage in closed containers. Never leave food out.  Use poison baits, powders, gels, or paste (for example, boric acid).   You can also use traps.  If a spray is used to kill roaches, stay out of the room until the odor   goes away.  Indoor Mold  Fix leaky faucets, pipes, or other sources of water that have mold   around them.  Clean moldy surfaces with a cleaner that has bleach in it.   Pollen and Outdoor Mold  What to do during your allergy season (when pollen or mold spore counts are high)  Try to keep your windows closed.  Stay indoors with windows closed from late morning to afternoon,   if you can. Pollen and some mold spore counts are highest at that time.  Ask your doctor whether you need to take or increase anti-inflammatory   medicine before your allergy season starts.  Irritants  Tobacco Smoke  If you  smoke, ask your doctor for ways to help you quit. Ask family   members to quit smoking, too.  Do not allow smoking in your home or car.  Smoke, Strong Odors, and Sprays  If possible, do not use a wood-burning stove, kerosene heater, or fireplace.  Try to stay away from strong odors and sprays, such as perfume, talcum  powder, hair spray, and paints.  Other things that bring on asthma symptoms in some people include:  Vacuum Cleaning  Try to get someone else to vacuum for you once or twice a week,   if you can. Stay out of rooms while they are being vacuumed and for   a short while afterward.  If you vacuum, use a dust mask (from a hardware store), a double-layered   or microfilter vacuum cleaner bag, or a vacuum cleaner with a HEPA filter.  Other Things That Can Make Asthma Worse  Sulfites in foods and beverages: Do not drink beer or wine or eat dried   fruit, processed potatoes, or shrimp if they cause asthma symptoms.  Cold air: Cover your nose and mouth with a scarf on cold or windy days.  Other medicines: Tell your doctor about all the medicines you take.   Include cold medicines, aspirin, vitamins and other supplements, and   nonselective beta-blockers (including those in eye drops).

## 2014-11-22 NOTE — Progress Notes (Signed)
Family Medicine Teaching Service Daily Progress Note Intern Pager: [phone]  Patient name: Laura May Medical record number: [id number] Date of birth: 1932/02/05 Age: 81 y.o. Gender: female  Primary Care Provider: Reginia Forts, MD Consultants: none Code Status: DNR   Pt Overview and Major Events to Date:  06/15: Admitted  Assessment and Plan: Laura May is a 81 y.o. female presenting with Asthma exacerbation . PMH is significant for asthma, GERD, HTN, anemia, h/o alcohol abuse.  Asthma exacerbation: Satting well on 1L [place]. Per ED report, patient desat to low 90s on RA with movement. Afebrile, VSS. S/p 2g Mag and solumedrol in EMS. CXR with possible infiltrate in RLL, likely atelectasis. No infectious symptoms. - Continue home symbicort BID.  Discussed with patient.  She reports this is what she takes at home. - Will dc with Albuterol. - continue home claritin and flonase as allergies may be playing a role - prednisone 50mg  daily x5d - O2 prn to maintain sats >95% - monitor on telemetry  Chest pain: Resolved but troponins 0.13>0.19>0.16.  Repeat EKG in AM: Qtc 549, sinus tach.  Patient reporting burning tightness in chest, associated with SOB. Likely related to asthma exacerbation, but given age and comorbidities will complete ACS r/o. ED EKG with sinus tachycardia, no ST elevations, unchanged from previous. Echo Systolic function was normal. EF 60% to 65.  Aortic valve: LVOT appears narrow. No obvious SAM but appears to be small LVOT gradient Valve area (VTI): 1.89 cm^2. Valve area(Vmax): 1.76 cm^2. Valve area (Vmean): 2.05 cm^2.  Mitral valve: Calcified leaflet tips. Valve area by continuityequation (using LVOT flow): 2.97 cm^2 - c/s to cards, appreciate recs.  Will add metoprolol 12.5 BID this am.    - risk strat labs ordered (Total 345/LDL 220).  ?Statin 81yo; A1c 6.4; TSH 0.493.  Will recommend Lipitor 40mg  at discharge - telemetry  AKI: Cr 1.0 on admission.  1.38>1.30 -IVFs: NS @100cc /h x12h -Stop mobic on dc  Hypokalemia, mild: resolved.  K 3.6>4.0 this am.  K 3.3 on admission. Will likely worsen with albuterol treatments. - replete with KDur 40mg  x1 - Continue to monitor  HTN: BP 151/67 HR 104 - Continue home losartan 50mg  daily - Add BB today  Microcytic Anemia: Hgb 10.4 on admission (at baseline) - Continue to monitor - Consider starting iron supplementation  Anxiety/Depression: Mood stable on admission - continue home remeron and klonopin 0.5 TID prn  OA: - STOP home mobic 15mg  daily, esp in setting of AKI  H/o alcohol abuse: patient reports she quit drinking in March - Continue home thiamine - monitor for signs of withdrawal, can consider addition of CIWA protocol (but albuterol may contribute to tachycardia and tremulousness)  FEN/GI: Reg diet, SLIV Prophylaxis: SQ heparin  Disposition: Discharge this afternoon if tolerating medications today  Subjective:  Patient doing well today.  Breathing is back to baseline.  Denies cough, CP, SOB, dizziness, n/v, abdominal pain.  Objective: Temp:  [98.2 F (36.8 C)-98.4 F (36.9 C)] 98.3 F (36.8 C) (06/17 0353) Pulse Rate:  [90-112] 90 (06/17 0353) Resp:  [18-24] 24 (06/17 0353) BP: (140-152)/(67-72) 151/67 mmHg (06/17 0353) SpO2:  [95 %-98 %] 98 % (06/17 0353) Physical Exam: General: awake, alert, NAD Cardiovascular: tachycardic, regular rhythm, no murmurs Respiratory: mild expiratory wheeze, good air movement, no increased WOB, breathing well on RA Abdomen: soft, NT/ND,+BS Extremities: warm, no edema  Laboratory:  Recent Labs Lab 11/20/14 1836 11/20/14 1855 11/21/14 0449  WBC 13.0*  --  8.9  HGB  10.5* 12.6 9.9*  HCT 32.7* 37.0 31.4*  PLT 293  --  300    Recent Labs Lab 11/20/14 1855 11/21/14 0449  NA 141 141  K 3.3* 3.6  CL 107 104  CO2  --  23  BUN 12 16  CREATININE 1.00 1.38*  CALCIUM  --  9.3  GLUCOSE 146* 229*    Cardiac Panel (last 3  results)  Recent Labs  11/20/14 2303 11/21/14 0449 11/21/14 1115  TROPONINI 0.13* 0.19* 0.16*    Imaging/Diagnostic Tests: Dg Chest Port 1 View  11/20/2014   CLINICAL DATA:  For structure distress.  Difficulty catching breath.  EXAM: PORTABLE CHEST - 1 VIEW  COMPARISON:  10/30/2014  FINDINGS: Normal cardiac silhouette. There is linear opacity at the left lateral lung base. Right lung is clear. Degenerate changes of shoulders, right greater than left.  IMPRESSION: Right lower lobe atelectasis versus infiltrate   Electronically Signed   By: Laura May M.D.   On: 11/20/2014 18:38   Laura Norlander, DO 11/16/2014, 7:00 AM PGY-1, [place] [facility] Intern pager: [phone], text pages welcome

## 2014-11-22 NOTE — Code Documentation (Signed)
Cold saline infusing upon arrival by ems

## 2014-11-22 NOTE — Progress Notes (Signed)
ETT pulled back 3cm by RT. ETT pulled to 20 @ teeth.

## 2014-11-22 NOTE — ED Notes (Signed)
Pt returned from CT °

## 2014-11-22 NOTE — Progress Notes (Signed)
Utilization review completed.  

## 2014-11-22 NOTE — Code Documentation (Signed)
Per ems- pt had syncopal episode 5 mins prior to their arrival. Pt pulseless and apenic upon ems arrival. 3 epi and 1 narcan administered PTA, pulses returned. Pt intubated with 7.0 tube.  Cap 40, pt ST upon arrival to ER.

## 2014-11-22 NOTE — Progress Notes (Signed)
PT arrived post-CPR. 7.0 ETT in place 23 @ teeth. Non-subglottic tube. Tube holder placed and Pt transitioned to Vent. RT will monitor.

## 2014-11-22 NOTE — Progress Notes (Signed)
Patient Name: Laura May Date of Encounter: 11/18/2014  Active Problems:   Respiratory distress   Asthma exacerbation   Pain in the chest   Elevated troponin   Primary Cardiologist: Dr Johnsie Cancel  Patient Profile: 81 yo female w/ hx asthma, DNR, HTN, Je[employer]'s Witness, ETOH use 1 pint qd. Admitted 06/15 w/ SOB/wheezing. Trop elevated and cards seeing. Echo results below.  SUBJECTIVE: Says quit drinking in January, feels bad today. Wants more sleep. Denies chest pain or SOB.   OBJECTIVE Filed Vitals:   11/21/14 2024 11/21/14 2220 11/15/2014 0353 11/21/2014 0750  BP:  140/67 151/67   Pulse: 109 102 90   Temp:  98.4 F (36.9 C) 98.3 F (36.8 C)   TempSrc:  Oral Oral   Resp: 18 20 24    Height:      Weight:      SpO2: 98% 98% 98% 94%    Intake/Output Summary (Last 24 hours) at 11/07/2014 1049 Last data filed at 11/21/14 2209  Gross per 24 hour  Intake    123 ml  Output    200 ml  Net    -77 ml   Filed Weights   11/20/14 2301  Weight: 111 lb 8 oz (50.576 kg)    PHYSICAL EXAM General: Well developed, well nourished, female in no acute distress. Head: Normocephalic, atraumatic.  Neck: Supple without bruits, JVD minimal elevation. Lungs:  Resp regular and unlabored, some dry rales and slight exp wheeze. Heart: RRR, S1, S2, no S3, S4, or murmur; no rub. Abdomen: Soft, non-tender, non-distended, BS + x 4.  Extremities: No clubbing, cyanosis, edema.  Neuro: Alert and oriented X 3. Moves all extremities spontaneously. Psych: Normal affect.  LABS: CBC: Recent Labs  11/21/14 0449 11/23/2014 0810  WBC 8.9 12.8*  HGB 9.9* 10.2*  HCT 31.4* 33.0*  MCV 70.2* 71.9*  PLT 300 979   Basic Metabolic Panel: Recent Labs  11/21/14 0449 11/12/2014 0810  NA 141 143  K 3.6 4.0  CL 104 110  CO2 23 25  GLUCOSE 229* 99  BUN 16 21*  CREATININE 1.38* 1.30*  CALCIUM 9.3 9.5   Cardiac Enzymes: Recent Labs  11/20/14 2303 11/21/14 0449 11/21/14 1115  TROPONINI 0.13* 0.19*  0.16*   Hemoglobin A1C: Recent Labs  11/21/14 0829  HGBA1C 6.4*   Fasting Lipid Panel: Recent Labs  11/21/14 0829  CHOL 345*  HDL 100  LDLCALC 220*  TRIG 127  CHOLHDL 3.5   Thyroid Function Tests: Recent Labs  11/21/14 0829  TSH 0.493     TELE: SR, ST       Echo: 11/21/2014 Study Conclusions - Left ventricle: The cavity size was normal. Wall thickness was normal. Systolic function was normal. The estimated ejection fraction was in the range of 60% to 65%. - Aortic valve: LVOT appears narrow. No obvious SAM but appears to be small LVOT gradient Valve area (VTI): 1.89 cm^2. Valve area (Vmax): 1.76 cm^2. Valve area (Vmean): 2.05 cm^2. - Mitral valve: Calcified leaflet tips. Valve area by continuity equation (using LVOT flow): 2.97 cm^2. - Left atrium: The atrium was mildly dilated. - Atrial septum: No defect or patent foramen ovale was identified. - Pulmonary arteries: PA peak pressure: 46 mm Hg (S).  Radiology/Studies: Dg Chest Port 1 View 11/20/2014   CLINICAL DATA:  For structure distress.  Difficulty catching breath.  EXAM: PORTABLE CHEST - 1 VIEW  COMPARISON:  10/30/2014  FINDINGS: Normal cardiac silhouette. There is linear opacity at the left  lateral lung base. Right lung is clear. Degenerate changes of shoulders, right greater than left.  IMPRESSION: Right lower lobe atelectasis versus infiltrate   Electronically Signed   By: Suzy Bouchard M.D.   On: 11/20/2014 18:38 Believe that should read LEFT lower lobe ATX vs INF   Current Medications:  . aspirin  325 mg Oral Daily  . budesonide-formoterol  2 puff Inhalation BID  . feeding supplement (ENSURE ENLIVE)  237 mL Oral BID BM  . fluticasone  2 spray Each Nare Daily  . heparin subcutaneous  5,000 Units Subcutaneous 3 times per day  . loratadine  10 mg Oral Daily  . metoprolol tartrate  12.5 mg Oral BID  . mirtazapine  15 mg Oral QHS  . pantoprazole  40 mg Oral Daily  . predniSONE  50 mg Oral Q  breakfast  . sodium chloride  3 mL Intravenous Q12H  . thiamine  100 mg Oral Daily      ASSESSMENT AND PLAN:   Pain in the chest - was mostly associated with SOB, improved    Elevated troponin - slight elevation in troponin - likely demand ischemia in the setting of respiratory distress - EF normal, no WMA noted, PAS 46 - ECG without acute ST changes - no further workup planned - ASA per IM  Otherwise, per IM With asthma, would avoid BB, but could add Cardizem if better HR/BP control desired Active Problems:   Respiratory distress   Asthma exacerbation    Signed, Barrett, Rhonda , PA-C 10:49 AM   I have seen and examined the patient along with Barrett, Rhonda , PA-C.  I have reviewed the chart, notes and new data.  I agree with PA's note.  Key new complaints: feels much better, no chest pain, breathing improved Key examination changes: no rales, few wheezes Key new findings / data: echo with hyperdynamic LV function and circumstantial evidence for diastolic dysfunction (LA dilation, but cannot directly assess LV diastolic properties due to tachycardia with E-A and e'-a' fusion). Mild pulmonary HTN may be due to diastolic dysfunction or lung disease.  PLAN: Agree we should avoid beta blockers. Low dose once daily diltiazem is reasonable. Restart statin - she was taking one in [place], stopped since in [place], no clear history of side effects. Target LDL-C <100 mg/dL. Aspirin 81 mg daily [facility] f/u.  Sanda Klein, MD, [place] [phone] 11/21/2014, 11:32 AM

## 2014-11-23 ENCOUNTER — Inpatient Hospital Stay (HOSPITAL_COMMUNITY): Payer: Medicare Other

## 2014-11-23 DIAGNOSIS — R4182 Altered mental status, unspecified: Secondary | ICD-10-CM

## 2014-11-23 DIAGNOSIS — I469 Cardiac arrest, cause unspecified: Secondary | ICD-10-CM

## 2014-11-23 DIAGNOSIS — J9601 Acute respiratory failure with hypoxia: Secondary | ICD-10-CM

## 2014-11-23 LAB — POCT I-STAT, CHEM 8
BUN: 20 mg/dL (ref 6–20)
BUN: 21 mg/dL — AB (ref 6–20)
BUN: 22 mg/dL — ABNORMAL HIGH (ref 6–20)
BUN: 23 mg/dL — ABNORMAL HIGH (ref 6–20)
BUN: 24 mg/dL — ABNORMAL HIGH (ref 6–20)
BUN: 25 mg/dL — AB (ref 6–20)
BUN: 26 mg/dL — AB (ref 6–20)
CALCIUM ION: 1.2 mmol/L (ref 1.13–1.30)
CALCIUM ION: 1.16 mmol/L (ref 1.13–1.30)
CALCIUM ION: 1.2 mmol/L (ref 1.13–1.30)
CHLORIDE: 109 mmol/L (ref 101–111)
CHLORIDE: 111 mmol/L (ref 101–111)
CHLORIDE: 111 mmol/L (ref 101–111)
CHLORIDE: 110 mmol/L (ref 101–111)
CREATININE: 1.1 mg/dL — AB (ref 0.44–1.00)
CREATININE: 1.2 mg/dL — AB (ref 0.44–1.00)
CREATININE: 1.2 mg/dL — AB (ref 0.44–1.00)
Calcium, Ion: 1.17 mmol/L (ref 1.13–1.30)
Calcium, Ion: 1.19 mmol/L (ref 1.13–1.30)
Calcium, Ion: 1.2 mmol/L (ref 1.13–1.30)
Calcium, Ion: 1.21 mmol/L (ref 1.13–1.30)
Chloride: 111 mmol/L (ref 101–111)
Chloride: 111 mmol/L (ref 101–111)
Chloride: 112 mmol/L — ABNORMAL HIGH (ref 101–111)
Creatinine, Ser: 1.2 mg/dL — ABNORMAL HIGH (ref 0.44–1.00)
Creatinine, Ser: 1.2 mg/dL — ABNORMAL HIGH (ref 0.44–1.00)
Creatinine, Ser: 1.1 mg/dL — ABNORMAL HIGH (ref 0.44–1.00)
Creatinine, Ser: 1.1 mg/dL — ABNORMAL HIGH (ref 0.44–1.00)
GLUCOSE: 129 mg/dL — AB (ref 65–99)
GLUCOSE: 149 mg/dL — AB (ref 65–99)
GLUCOSE: 204 mg/dL — AB (ref 65–99)
GLUCOSE: 237 mg/dL — AB (ref 65–99)
Glucose, Bld: 93 mg/dL (ref 65–99)
Glucose, Bld: 118 mg/dL — ABNORMAL HIGH (ref 65–99)
Glucose, Bld: 137 mg/dL — ABNORMAL HIGH (ref 65–99)
HCT: 30 % — ABNORMAL LOW (ref 36.0–46.0)
HCT: 30 % — ABNORMAL LOW (ref 36.0–46.0)
HCT: 31 % — ABNORMAL LOW (ref 36.0–46.0)
HCT: 32 % — ABNORMAL LOW (ref 36.0–46.0)
HCT: 33 % — ABNORMAL LOW (ref 36.0–46.0)
HCT: 33 % — ABNORMAL LOW (ref 36.0–46.0)
HEMATOCRIT: 31 % — AB (ref 36.0–46.0)
HEMOGLOBIN: 10.2 g/dL — AB (ref 12.0–15.0)
Hemoglobin: 10.2 g/dL — ABNORMAL LOW (ref 12.0–15.0)
Hemoglobin: 10.5 g/dL — ABNORMAL LOW (ref 12.0–15.0)
Hemoglobin: 10.5 g/dL — ABNORMAL LOW (ref 12.0–15.0)
Hemoglobin: 10.9 g/dL — ABNORMAL LOW (ref 12.0–15.0)
Hemoglobin: 11.2 g/dL — ABNORMAL LOW (ref 12.0–15.0)
Hemoglobin: 11.2 g/dL — ABNORMAL LOW (ref 12.0–15.0)
POTASSIUM: 3.6 mmol/L (ref 3.5–5.1)
POTASSIUM: 3.4 mmol/L — AB (ref 3.5–5.1)
POTASSIUM: 4.1 mmol/L (ref 3.5–5.1)
Potassium: 3.5 mmol/L (ref 3.5–5.1)
Potassium: 4.2 mmol/L (ref 3.5–5.1)
Potassium: 4.4 mmol/L (ref 3.5–5.1)
Potassium: 4.5 mmol/L (ref 3.5–5.1)
SODIUM: 140 mmol/L (ref 135–145)
SODIUM: 143 mmol/L (ref 135–145)
Sodium: 143 mmol/L (ref 135–145)
Sodium: 143 mmol/L (ref 135–145)
Sodium: 142 mmol/L (ref 135–145)
Sodium: 140 mmol/L (ref 135–145)
Sodium: 141 mmol/L (ref 135–145)
TCO2: 17 mmol/L (ref 0–100)
TCO2: 19 mmol/L (ref 0–100)
TCO2: 19 mmol/L (ref 0–100)
TCO2: 19 mmol/L (ref 0–100)
TCO2: 20 mmol/L (ref 0–100)
TCO2: 20 mmol/L (ref 0–100)
TCO2: 20 mmol/L (ref 0–100)

## 2014-11-23 LAB — GLUCOSE, CAPILLARY
GLUCOSE-CAPILLARY: 185 mg/dL — AB (ref 65–99)
GLUCOSE-CAPILLARY: 85 mg/dL (ref 65–99)
GLUCOSE-CAPILLARY: 150 mg/dL — AB (ref 65–99)
GLUCOSE-CAPILLARY: 150 mg/dL — AB (ref 65–99)
GLUCOSE-CAPILLARY: 116 mg/dL — AB (ref 65–99)
Glucose-Capillary: 109 mg/dL — ABNORMAL HIGH (ref 65–99)
Glucose-Capillary: 115 mg/dL — ABNORMAL HIGH (ref 65–99)
Glucose-Capillary: 122 mg/dL — ABNORMAL HIGH (ref 65–99)
Glucose-Capillary: 128 mg/dL — ABNORMAL HIGH (ref 65–99)
Glucose-Capillary: 133 mg/dL — ABNORMAL HIGH (ref 65–99)
Glucose-Capillary: 153 mg/dL — ABNORMAL HIGH (ref 65–99)
Glucose-Capillary: 166 mg/dL — ABNORMAL HIGH (ref 65–99)
Glucose-Capillary: 210 mg/dL — ABNORMAL HIGH (ref 65–99)
Glucose-Capillary: 225 mg/dL — ABNORMAL HIGH (ref 65–99)
Glucose-Capillary: 90 mg/dL (ref 65–99)
Glucose-Capillary: 92 mg/dL (ref 65–99)
Glucose-Capillary: 97 mg/dL (ref 65–99)

## 2014-11-23 LAB — POCT I-STAT 3, ART BLOOD GAS (G3+)
Acid-base deficit: 4 mmol/L — ABNORMAL HIGH (ref 0.0–2.0)
Acid-base deficit: 9 mmol/L — ABNORMAL HIGH (ref 0.0–2.0)
BICARBONATE: 20.7 meq/L (ref 20.0–24.0)
Bicarbonate: 17.5 mEq/L — ABNORMAL LOW (ref 20.0–24.0)
O2 SAT: 92 %
O2 Saturation: 97 %
PH ART: 7.348 — AB (ref 7.350–7.450)
PH ART: 7.415 (ref 7.350–7.450)
PO2 ART: 78 mmHg — AB (ref 80.0–100.0)
Patient temperature: 33.1
TCO2: 19 mmol/L (ref 0–100)
TCO2: 22 mmol/L (ref 0–100)
pCO2 arterial: 30.1 mmHg — ABNORMAL LOW (ref 35.0–45.0)
pCO2 arterial: 30.9 mmHg — ABNORMAL LOW (ref 35.0–45.0)
pO2, Arterial: 51 mmHg — ABNORMAL LOW (ref 80.0–100.0)

## 2014-11-23 LAB — CBC
HCT: 27.7 % — ABNORMAL LOW (ref 36.0–46.0)
HCT: 27.6 % — ABNORMAL LOW (ref 36.0–46.0)
HEMOGLOBIN: 8.8 g/dL — AB (ref 12.0–15.0)
Hemoglobin: 8.6 g/dL — ABNORMAL LOW (ref 12.0–15.0)
MCH: 22.1 pg — ABNORMAL LOW (ref 26.0–34.0)
MCH: 22.7 pg — AB (ref 26.0–34.0)
MCHC: 31.8 g/dL (ref 30.0–36.0)
MCHC: 31.2 g/dL (ref 30.0–36.0)
MCV: 71.4 fL — ABNORMAL LOW (ref 78.0–100.0)
MCV: 71 fL — ABNORMAL LOW (ref 78.0–100.0)
PLATELETS: 274 10*3/uL (ref 150–400)
Platelets: 276 10*3/uL (ref 150–400)
RBC: 3.88 MIL/uL (ref 3.87–5.11)
RBC: 3.89 MIL/uL (ref 3.87–5.11)
RDW: 14.8 % (ref 11.5–15.5)
RDW: 14.8 % (ref 11.5–15.5)
WBC: 13.7 10*3/uL — ABNORMAL HIGH (ref 4.0–10.5)
WBC: 16.2 10*3/uL — AB (ref 4.0–10.5)

## 2014-11-23 LAB — PROTIME-INR
INR: 1.05 (ref 0.00–1.49)
INR: 1.06 (ref 0.00–1.49)
PROTHROMBIN TIME: 13.9 s (ref 11.6–15.2)
Prothrombin Time: 14 seconds (ref 11.6–15.2)

## 2014-11-23 LAB — CG4 I-STAT (LACTIC ACID): LACTIC ACID, VENOUS: 1.45 mmol/L (ref 0.5–2.0)

## 2014-11-23 LAB — BASIC METABOLIC PANEL
ANION GAP: 7 (ref 5–15)
BUN: 20 mg/dL (ref 6–20)
CALCIUM: 8.1 mg/dL — AB (ref 8.9–10.3)
CO2: 22 mmol/L (ref 22–32)
Chloride: 112 mmol/L — ABNORMAL HIGH (ref 101–111)
Creatinine, Ser: 1.11 mg/dL — ABNORMAL HIGH (ref 0.44–1.00)
GFR calc non Af Amer: 45 mL/min — ABNORMAL LOW (ref >60)
GFR, EST AFRICAN AMERICAN: 53 mL/min — AB (ref >60)
Glucose, Bld: 145 mg/dL — ABNORMAL HIGH (ref 65–99)
Potassium: 4.2 mmol/L (ref 3.5–5.1)
SODIUM: 141 mmol/L (ref 135–145)

## 2014-11-23 LAB — TROPONIN I
TROPONIN I: 0.08 ng/mL — AB (ref <0.031)
TROPONIN I: 0.08 ng/mL — AB (ref <0.031)
Troponin I: 0.08 ng/mL — ABNORMAL HIGH (ref <0.031)
Troponin I: 0.07 ng/mL — ABNORMAL HIGH (ref <0.031)

## 2014-11-23 LAB — APTT
aPTT: 27 seconds (ref 24–37)
aPTT: 28 seconds (ref 24–37)

## 2014-11-23 LAB — LACTIC ACID, PLASMA
Lactic Acid, Venous: 2 mmol/L (ref 0.5–2.0)
Lactic Acid, Venous: 2.5 mmol/L (ref 0.5–2.0)

## 2014-11-23 LAB — MRSA PCR SCREENING: MRSA BY PCR: NEGATIVE

## 2014-11-23 MED ORDER — HYDRALAZINE HCL 20 MG/ML IJ SOLN
INTRAMUSCULAR | Status: AC
  Filled 2014-11-23: qty 1

## 2014-11-23 MED ORDER — HYDRALAZINE HCL 20 MG/ML IJ SOLN: 10.0000 mg | INTRAMUSCULAR | Status: DC | PRN

## 2014-11-23 MED ORDER — SODIUM CHLORIDE 0.9 % IV SOLN
1.0000 mg/h | INTRAVENOUS | Status: DC
  Administered 2014-11-23: 6 mg/h via INTRAVENOUS
  Administered 2014-11-23: 2 mg/h via INTRAVENOUS
  Administered 2014-11-24 (×2): 6 mg/h via INTRAVENOUS
  Filled 2014-11-23 (×4): qty 10

## 2014-11-23 MED ORDER — CHLORHEXIDINE GLUCONATE 0.12 % MT SOLN
15.0000 mL | Freq: Two times a day (BID) | OROMUCOSAL | Status: DC
  Administered 2014-11-23 – 2014-11-27 (×10): 15 mL via OROMUCOSAL
  Filled 2014-11-23 (×12): qty 15

## 2014-11-23 MED ORDER — CETYLPYRIDINIUM CHLORIDE 0.05 % MT LIQD
7.0000 mL | Freq: Four times a day (QID) | OROMUCOSAL | Status: DC
  Administered 2014-11-23 – 2014-11-27 (×18): 7 mL via OROMUCOSAL

## 2014-11-23 MED ORDER — INSULIN ASPART 100 UNIT/ML ~~LOC~~ SOLN
0.0000 [IU] | SUBCUTANEOUS | Status: DC
  Administered 2014-11-23: 5 [IU] via SUBCUTANEOUS
  Administered 2014-11-23: 2 [IU] via SUBCUTANEOUS
  Administered 2014-11-23 (×2): 3 [IU] via SUBCUTANEOUS

## 2014-11-23 MED ORDER — HYDRALAZINE HCL 20 MG/ML IJ SOLN
10.0000 mg | INTRAMUSCULAR | Status: DC | PRN
  Administered 2014-11-23: 10 mg via INTRAVENOUS

## 2014-11-23 MED ORDER — SODIUM BICARBONATE 8.4 % IV SOLN
INTRAVENOUS | Status: AC
  Filled 2014-11-23: qty 50

## 2014-11-23 MED ORDER — POTASSIUM CHLORIDE 10 MEQ/50ML IV SOLN
10.0000 meq | INTRAVENOUS | Status: AC
  Administered 2014-11-23 (×4): 10 meq via INTRAVENOUS
  Filled 2014-11-23 (×4): qty 50

## 2014-11-23 MED ORDER — PANTOPRAZOLE SODIUM 40 MG IV SOLR
40.0000 mg | INTRAVENOUS | Status: DC
  Administered 2014-11-23 – 2014-11-26 (×4): 40 mg via INTRAVENOUS
  Filled 2014-11-23 (×5): qty 40

## 2014-11-23 NOTE — Procedures (Signed)
Arterial Catheter Insertion Procedure Note KIMBRIA CAMPOSANO [id number] [birth year]  Procedure: Insertion of Arterial Catheter  Indications: Blood pressure monitoring  Procedure Details Consent: Unable to obtain consent because of altered level of consciousness. Time Out: Verified patient identification, verified procedure, site/side was marked, verified correct patient position, special equipment/implants available, medications/allergies/relevent history reviewed, required imaging and test results available.  Performed  Maximum sterile technique was used including antiseptics, cap, gloves, gown, hand hygiene, mask and sheet. Skin prep: Chlorhexidine; local anesthetic administered 20 gauge catheter was inserted into left radial artery using the Seldinger technique.  Evaluation Blood flow good; BP tracing good. Complications: No apparent complications.   Marlowe Aschoff 11/23/2014

## 2014-11-23 NOTE — Progress Notes (Signed)
  Chart reviewed.   Agree with Dr. Serita Grit consult note. Doubt this is primary cardiac event. Troponins remain flat.   Will follow at a distance. Please call with questions.   Bensimhon, Daniel,MD 10:43 AM

## 2014-11-23 NOTE — Progress Notes (Signed)
eLink Physician-Brief Progress Note Patient Name: Laura May DOB: [birth year] MRN: [id number]   Date of Service  11/23/2014  HPI/Events of Note  No sup proph  eICU Interventions  ppi iv ordered     Intervention Category Intermediate Interventions: Best-practice therapies (e.g. DVT, beta blocker, etc.)  Asencion Noble 11/23/2014, 10:54 PM

## 2014-11-23 NOTE — Progress Notes (Signed)
eLink Physician-Brief Progress Note Patient Name: Laura May DOB: 30-Mar-1932 MRN: [id number]   Date of Service  11/23/2014  HPI/Events of Note  Hyperglycemia  eICU Interventions  Q4 hours SSI moderate scale     Intervention Category Intermediate Interventions: Hyperglycemia - evaluation and treatment  [place] 11/23/2014, 1:23 AM

## 2014-11-23 NOTE — Procedures (Signed)
History: 81 yo F s/p cardiac arrest  Sedation: Propofol  Technique: This is a 19 channel routine scalp EEG performed at the bedside with bipolar and monopolar montages arranged in accordance to the international 10/20 system of electrode placement. One channel was dedicated to EKG recording.    Background: The background consists of generalized suppression with occasional hgih amplitude bursts of intermixed elta and theta activities lasting 1 - 2 seconds. . There is no posterior dominant rhythm.  Photic stimulation: Physiologic driving is not performed.   EEG Abnormalities: 1) Burst-suppression pattern  Clinical Interpretation: This EEG is consistent with a profound generalized cerebral dysfunction. This could be consistent with hypoxic-ischemic injury, though the presence of propofol and hypothermia with this recording would be confounding and the EEG should be repeated once re-warmed and off sedating medications before I would consider this reliable for prognostication.   Roland Rack, MD [facility] [phone]  If 7pm- 7am, please page neurology on call as listed in [place].

## 2014-11-23 NOTE — Procedures (Signed)
Central Venous Catheter Insertion Procedure Note Laura May [id number] 14-Aug-1931  Procedure: Insertion of Central Venous Catheter Indications: Assessment of intravascular volume, Drug and/or fluid administration and Frequent blood sampling  Procedure Details Consent: Risks of procedure as well as the alternatives and risks of each were explained to the (patient/caregiver).  Consent for procedure obtained. Time Out: Verified patient identification, verified procedure, site/side was marked, verified correct patient position, special equipment/implants available, medications/allergies/relevent history reviewed, required imaging and test results available.  Performed  Maximum sterile technique was used including antiseptics, cap, gloves, gown, hand hygiene, mask and sheet. Skin prep: Chlorhexidine; local anesthetic administered A antimicrobial bonded/coated triple lumen catheter was placed in the right internal jugular vein using the Seldinger technique.  Evaluation Blood flow good Complications: No apparent complications Patient did tolerate procedure well. Chest X-ray ordered to verify placement.  CXR: showed line too deep, pulled back 3 cm, CXR repeated.Marland Kitchen  Johann Santone 11/23/2014, 8:02 AM

## 2014-11-23 NOTE — Progress Notes (Signed)
11/18/2014 2300  Clinical Encounter Type  Visited With Family;Patient not available;Health care provider  Visit Type Psychological support;Spiritual support;Critical Care  Referral From Nurse  Consult/Referral To Chaplain  Spiritual Encounters  Spiritual Needs Emotional;Grief support  Met famimly; PT post-CPR; coordinated visits with PT and Family; Liaison with Healthcare team and offered support, emotional and spiritual as needed; PT moved to 2H, family and church family in 2H waiting area. 

## 2014-11-23 NOTE — H&P (Signed)
PULMONARY / CRITICAL CARE MEDICINE HISTORY AND PHYSICAL EXAMINATION   Name: MARIELLA BLACKWELDER MRN: [id number] DOB: 08/10/1931    ADMISSION DATE:  11/10/2014  PRIMARY SERVICE: PCCM  CHIEF COMPLAINT:  S/p cardiac arrest  BRIEF PATIENT DESCRIPTION: 81 y/o woman presents with cardiac arrest following hospital discharge  [place] / STUDIES:  Out of hospital arrest  LINES / TUBES: 7 mm ETT Art line 6/17 CVC R IJ 6/18 OGT 6/17 Foley 6/17  CULTURES: Urine 6/17 Blood 6/17 x2  ANTIBIOTICS: Vanc 6/17-> Pip-tazo 6/17->  HISTORY OF PRESENT ILLNESS:   Ms. No is a 81 y/o woman with a history listed below who presented to the ED with cardiac arrest hours following discharge. The patient was admitted two days ago for dyspnea and chest discomfort which was attributed to asthma exacerbation. She was treated and discharge on 6/17, around 16:00. Her family states that on arrival home she developed some dyspnea and chest discomfort, and then suddenly started gasping for breath. EMS was called, and 911 dispatcher directed the family to perform a basic assessment, and the patient was found to be apneic and pulseless. She was without adequate resusitatitive measures for about 15 minutes until EMS arrived. She received ACLS for another 15 minutes and ROSC was achieved. In the ED, she seemed to be responding to noxious stimulous, and PCCM was called for admission with possible cooling.  PAST MEDICAL HISTORY :  Past Medical History  Diagnosis Date  . Alcohol abuse   . Brain tumor   . Brain aneurysm   . Arthritis   . Hypertension   . Constipation     last colonoscopy 2011  . Colon polyps 06/07/2009  . Dementia   . Asthma   . Shortness of breath dyspnea   . Refusal of blood transfusions as patient is Jehovah's Witness    Past Surgical History  Procedure Laterality Date  . Brain surgery    . Abdominal hysterectomy      DUB; removed B ovaries.   Prior to Admission medications    Medication Sig Start Date End Date Taking? Authorizing Provider  albuterol (PROVENTIL HFA;VENTOLIN HFA) 108 (90 BASE) MCG/ACT inhaler Inhale 2 puffs into the lungs every 6 (six) hours as needed for wheezing or shortness of breath. 11/16/2014  Yes Ashly M Gottschalk, DO  albuterol (PROVENTIL) (2.5 MG/3ML) 0.083% nebulizer solution Take 3 mLs (2.5 mg total) by nebulization every 6 (six) hours as needed for wheezing or shortness of breath. 10/30/14  Yes [name], MD  aspirin 325 MG tablet Take 325 mg by mouth once.   Yes Historical Provider, MD  budesonide-formoterol (SYMBICORT) 160-4.5 MCG/ACT inhaler Inhale 2 puffs into the lungs 2 (two) times daily. 10/30/14  Yes [name], MD  clonazePAM (KLONOPIN) 0.5 MG tablet Take 1 tablet (0.5 mg total) by mouth every 6 (six) hours as needed for anxiety. 07/13/14  Yes Roselee Culver, MD  diltiazem (CARDIZEM CD) 120 MG 24 hr capsule Take 1 capsule (120 mg total) by mouth daily. 11/21/2014  Yes Ashly Windell Moulding, DO  docusate sodium (COLACE) 100 MG capsule Take 100 mg by mouth daily.    Yes Historical Provider, MD  feeding supplement, ENSURE COMPLETE, (ENSURE COMPLETE) LIQD Take 237 mLs by mouth 2 (two) times daily between meals. 05/27/14  Yes Eugenie Filler, MD  fluticasone (FLONASE) 50 MCG/ACT nasal spray Place 2 sprays into both nostrils daily. 10/30/14  Yes [name], MD  loratadine (CLARITIN) 10 MG tablet Take 10  mg by mouth once.   Yes Historical Provider, MD  losartan (COZAAR) 50 MG tablet Take 1 tablet (50 mg total) by mouth daily. 07/13/14  Yes Roselee Culver, MD  Magnesium Hydroxide (MILK OF MAGNESIA PO) Take 1 each by mouth daily as needed (constipation).   Yes Historical Provider, MD  metoprolol tartrate (LOPRESSOR) 25 MG tablet Take 12.5 mg by mouth 2 (two) times daily.   Yes Historical Provider, MD  mirtazapine (REMERON) 15 MG tablet Take 1 tablet (15 mg total) by mouth at bedtime. 07/13/14  Yes Roselee Culver, MD  pantoprazole  (PROTONIX) 40 MG tablet Take 1 tablet (40 mg total) by mouth daily. 07/13/14  Yes Roselee Culver, MD  predniSONE (DELTASONE) 50 MG tablet Take 1 tablet (50 mg total) by mouth daily with breakfast. 11/23/2014 11/25/14 Yes Ashly Windell Moulding, DO  Thiamine HCl (VITAMIN B-1) 250 MG tablet Take 125 mg by mouth daily.   Yes Historical Provider, MD   Allergies  Allergen Reactions  . Fexofenadine Other (See Comments)    Unknown reaction    FAMILY HISTORY:  Family History  Problem Relation Age of Onset  . Cancer Mother   . Heart disease Father    SOCIAL HISTORY:  reports that she has quit smoking. She has quit using smokeless tobacco. Her smokeless tobacco use included Snuff. She reports that she drinks alcohol. She reports that she does not use illicit drugs.  REVIEW OF SYSTEMS:  n/a  SUBJECTIVE:   VITAL SIGNS: Temp:  [89.1 F (31.7 C)-98.7 F (37.1 C)] 91.4 F (33 C) (06/18 0500) Pulse Rate:  [64-107] 64 (06/18 0400) Resp:  [11-28] 28 (06/18 0400) BP: (112-235)/(54-99) 134/54 mmHg (06/18 0323) SpO2:  [94 %-100 %] 100 % (06/18 0400) Arterial Line BP: (142-227)/(54-85) 155/65 mmHg (06/18 0400) FiO2 (%):  [40 %-100 %] 60 % (06/18 0323) Weight:  [120 lb 13 oz (54.8 kg)] 120 lb 13 oz (54.8 kg) (06/18 0000) HEMODYNAMICS: CVP:  [12 mmHg] 12 mmHg VENTILATOR SETTINGS: Vent Mode:  [-] PRVC FiO2 (%):  [40 %-100 %] 60 % Set Rate:  [14 bmp-28 bmp] 28 bmp Vt Set:  [300 mL-500 mL] 300 mL PEEP:  [5 cmH20-10 cmH20] 10 cmH20 Plateau Pressure:  [22 cmH20-32 cmH20] 22 cmH20 INTAKE / OUTPUT: Intake/Output      06/17 0701 - 06/18 0700   I.V. (mL/kg) 3214.8 (58.7)   IV Piggyback 110   Total Intake(mL/kg) 3324.8 (60.7)   Urine (mL/kg/hr) 1700   Emesis/NG output 100   Total Output 1800   Net +1524.8         PHYSICAL EXAMINATION: General:  Elderly woman intubated and sedated Neuro:  Non-purposeful response to noxious stimulus. HEENT:  MMM Neck: Trachea is midline Cardiovascular:  RRR, No  M/R/G Lungs:  CTAB Abdomen:  Soft Musculoskeletal:  I/O in place on R shin, Skin:  No rashes.  LABS:  CBC  Recent Labs Lab  0810 11/14/2014 1930 11/23/14 0028 11/23/14 0042 11/23/14 0239  WBC 12.8* 18.1* 13.7*  --   --   HGB 10.2* 9.5* 8.8* 10.5* 10.5*  HCT 33.0* 30.7* 27.7* 31.0* 31.0*  PLT 288 331 274  --   --    Coag's  Recent Labs Lab 11/23/14 0028  APTT 27  INR 1.06   BMET  Recent Labs Lab 11/21/14 0449 11/09/2014 0810 11/26/2014 1930 11/23/14 0042 11/23/14 0239  NA 141 143 139 140 143  K 3.6 4.0 6.5* 4.4 3.6  CL 104 110 107 112* 109  CO2 23 25 11*  --   --   BUN 16 21* 25* 26* 25*  CREATININE 1.38* 1.30* 1.79* 1.20* 1.20*  GLUCOSE 229* 99 266* 237* 204*   Electrolytes  Recent Labs Lab 11/21/14 0449 11/19/2014 0810 12/02/2014 1930  CALCIUM 9.3 9.5 9.0   Sepsis Markers  Recent Labs Lab 11/17/2014 1943 11/23/14 0255  LATICACIDVEN 11.27* 2.0   ABG  Recent Labs Lab 11/13/2014 2311 11/23/14 0048  PHART 7.390 7.348*  PCO2ART 25.6* 30.1*  PO2ART 76.0* 51.0*   Liver Enzymes  Recent Labs Lab 11/18/2014 1930  AST 55*  ALT 39  ALKPHOS 64  BILITOT 0.6  ALBUMIN 3.0*   Cardiac Enzymes  Recent Labs Lab 11/21/14 1115 11/26/2014 1930 11/23/14 0028  TROPONINI 0.16* 0.07* 0.08*   Glucose  Recent Labs Lab 12/05/2014 1941 11/23/14 0012 11/23/14 0114 11/23/14 0237 11/23/14 0326 11/23/14 0410  GLUCAP 223* 225* 210* 185* 166* 153*    Imaging Ct Head Wo Contrast  11/21/2014   CLINICAL DATA:  Status post cardiopulmonary resuscitation. Altered mental status/ syncope  EXAM: CT HEAD WITHOUT CONTRAST  TECHNIQUE: Contiguous axial images were obtained from the base of the skull through the vertex without intravenous contrast.  COMPARISON:  Head CT May 23, 2014; brain MRI May 24, 2014  FINDINGS: There is mild diffuse atrophy, stable. There is no intracranial mass, hemorrhage, extra-axial fluid collection, or midline shift. There is mild  patchy small vessel disease in the centra semiovale bilaterally. There is no new gray-white compartment lesion. No acute infarct evident.  The patient is status post previous right frontal and temporal craniotomy. Bony calvarium otherwise appears intact. Mastoid air cells are clear. There is extensive debris throughout the nasopharynx. There is an air-fluid level in the left maxillary antrum. There are air-fluid levels in each maxillary antrum. Most of the ethmoid air cells are opacified. There is a stable bony defect in the lamina papyracea on the left.  IMPRESSION: Atrophy with mild stable periventricular small vessel disease. No intracranial mass, hemorrhage, or acute appearing infarct. Bony defect on the left from previous craniotomy. Extensive paranasal sinus disease as well as diffuse opacification of the nares and visualize nasopharynx. Question hemorrhage in these areas given the recent CPR.   Electronically Signed   By: Lowella Grip III M.D.   On: 12/01/2014 20:42   Ct Angio Chest Pe W/cm &/or Wo Cm  12/03/2014   CLINICAL DATA:  Post CPR, cardiac arrest  EXAM: CT ANGIOGRAPHY CHEST WITH CONTRAST  TECHNIQUE: Multidetector CT imaging of the chest was performed using the standard protocol during bolus administration of intravenous contrast. Multiplanar CT image reconstructions and MIPs were obtained to evaluate the vascular anatomy.  CONTRAST:  33mL OMNIPAQUE IOHEXOL 350 MG/ML SOLN  COMPARISON:  Chest radiograph same date  FINDINGS: Mediastinum/Nodes: Endotracheal tube tip is within the right mainstem bronchus. Nasogastric tube in place with fluid within the esophagus or diffuse esophageal wall thickening, unable to further differentiate given technique at the current study. Heart size is normal. No pericardial effusion. Moderate atheromatous aortic and coronary arterial calcification.  The examination is adequate for evaluation for acute pulmonary embolism up to and including the 3rd order pulmonary  arteries. No focal filling defect is identified up to and including the 3rd order pulmonary arteries to suggest acute pulmonary embolism.  Lungs/Pleura: Right upper lobe consolidation as well as areas of curvilinear scarring or atelectasis noted. Subpleural bilateral lower lobe atelectasis or scarring noted. No measurable mass, nodule, or consolidation is otherwise identified.  No pleural effusion.  Upper abdomen: Right upper renal pole cyst measures 5.5 cm image 80. Nasogastric tube tip terminates at least at the distal stomach but is not included in the field of view. Adrenal glands are normal in their visualized aspects.  Musculoskeletal: No acute osseous abnormality.  Review of the MIP images confirms the above findings.  IMPRESSION: No CT evidence for acute pulmonary embolism.  Patchy right upper lobe consolidation which may indicate pneumonia although aspiration or other alveolar filling processes could appear similar.  Right mainstem bronchus intubation. Consider pulling back endotracheal tube 3-4 cm for more optimal positioning.  Fluid filled or thick-walled esophagus.  Critical Value/emergent results were called by telephone at the time of interpretation on 11/12/2014 at 8:46 pm to RN Raquel Sarna who verbally acknowledged these results.   Electronically Signed   By: Conchita Paris M.D.   On: 11/09/2014 20:47   Dg Chest Portable 1 View  11/23/2014   CLINICAL DATA:  Shortness of breath.  Central venous catheter.  EXAM: PORTABLE CHEST - 1 VIEW  COMPARISON:  11/23/2014  FINDINGS: Endotracheal tube with tip measuring 3.3 cm above the carina. Enteric tube is below the field of view but in the upper abdomen consistent with location at least in the distal stomach. Right central venous catheter with tip over the cavoatrial junction. Normal heart size and pulmonary vascularity. Linear infiltration or atelectasis in the right mid lung and left lung base. No blunting of costophrenic angles. No pneumothorax.  IMPRESSION:  Appliances appear in satisfactory location. Persistent linear atelectasis or infiltration in the right mid lung and left lung base.   Electronically Signed   By: Lucienne Capers M.D.   On: 11/23/2014 02:48   Dg Chest Portable 1 View  11/23/2014   CLINICAL DATA:  81 year old female central venous catheter placement. Status post cardiac arrest, CPR. Initial encounter.  EXAM: PORTABLE CHEST - 1 VIEW  COMPARISON:  Chest CTA 12/01/2014 and earlier.  FINDINGS: Portable AP semi upright view at 0132 hrs.  The endotracheal tube tip now projects about 8 mm above the carina. This could be retracted slightly farther more optimal positioning.  An enteric tube is been placed, coursing to the upper abdomen and probably crossing midline, but tip is not included.  Right IJ central line is been placed. Tip projects at the right atrium level, about 3 cm below the cavoatrial junction.  There is continued Patchy and confluent right upper lobe opacity as seen on the recent CTA. Stable cardiac size and mediastinal contours. No pneumothorax. Resuscitation or pacer pad still project over the chest. Mildly increased streaky perihilar opacity on the left.  IMPRESSION: 1. Right IJ central line placed, tip at the level of the right atrium. Retract 2-3 cm for cavoatrial junction level placement. 2. ETT tip now 8 mm above the carina. 3. Enteric tube placed and courses to the abdomen, tip not included. 4. No pneumothorax. Continued Patchy and confluent right upper lobe opacity seen on the recent CTA. Mildly increased left perihilar opacity.   Electronically Signed   By: Genevie Ann M.D.   On: 11/23/2014 01:49   Dg Chest Port 1 View  12/04/2014   CLINICAL DATA:  Cardiac arrest.  Intubated patient  EXAM: PORTABLE CHEST - 1 VIEW  COMPARISON:  Radiograph 11/20/2014  FINDINGS: Endotracheal tube position all at the level the carina and directed towards the right mainstem bronchus. Consider retraction by 3 to 4 cm. Normal cardiac silhouette. No  pulmonary edema. No pleural fluid. No  pneumothorax.  IMPRESSION: 1. Endotracheal tube is essentially at the carina. Consider retraction by 3 to 4 cm. 2. No atelectasis, edema, or infiltrate.   Electronically Signed   By: Suzy Bouchard M.D.   On: 11/10/2014 19:43    EKG: NSR with peaked T-waves CXR: patchy infiltrate, R>L. Elevated R hemidiaphragm.  ASSESSMENT / PLAN:  Active Problems:   Cardiac arrest   PULMONARY A: Hypoxia Need for mechanical ventilation Possible aspiration P:   - maintain on ARDSnet protocol. Patient may have aspirated as primary cause leading to cardiac arrest.  CARDIOVASCULAR A: S/p cardiac arrest Less likely ACS Hypertension P:   Cardiology following. Cooling protocol initiated. Patient is now hypertensive. May be continuing to have effects of epinephrine given during ACLS.  RENAL A: Hyperkalemia AKI Severe gap metabolic acidosis due to lactic acidosis, now improved P:   K has normalized as pH has been corrected. Pt is at high risk for ATN given severe prolonged hypoperfusion.  GASTROINTESTINAL A: No active issues P:   Consider TFs  HEMATOLOGIC A: No active issues P:   Patient should be considered coagulopathic while cool  INFECTIOUS A: No active issues P:    ENDOCRINE A: No active issues P:    NEUROLOGIC A: Severe global ischemic injury P:   Undergoing cooling. Will assess neurologic status once completed protocol and discuss with family goals of care.  BEST PRACTICE / DISPOSITION Level of Care:  ICU Primary Service:  PCCM Consultants:  Cards Code Status:  Full Diet:  NPO DVT Px:  Hep gtt GI Px:  N/A Skin Integrity:  Intact Social / Family:  Updated  Family meeting note: A long discussion was had with the patient's family, including daughter (de-factor POA). Her prognosis is likely poor, but in the acute setting while cooled and on sedatitives, the certainty of that assessment is low. Discussed that patient's prior  code status was DNR and how that informs our decisions going forward. The consensus was that the patient was "tired" of her situation and was concerned about low quality of life. However, family members did not believe that the patient would be opposed to temporary invasive measures if they offered the hope of a retained quality of life. I discussed that it was not likely that the patient would achieve her prior quality of life, but after a 24-48 hour trial of critical care, we would likely know with greater certainty if/how impaired the patient is likely to be. I did mention that if we did ascertain significant neurologic injury, the decision most consistent with her stated wishes would be terminal extubation. The family noted agreement.  TODAY'S SUMMARY: 81 y/o woman recently discharged developed out-of-hospital cardiac arrest.  I have personally obtained a history, examined the patient, evaluated laboratory and imaging results, formulated the assessment and plan and placed orders.  CRITICAL CARE: The patient is critically ill with multiple organ systems failure and requires high complexity decision making for assessment and support, frequent evaluation and titration of therapies, application of advanced monitoring technologies and extensive interpretation of multiple databases. Critical Care Time devoted to patient care services described in this note is 120 minutes.   Luz Brazen, MD Pulmonary and [place] Pager: [phone]   11/23/2014, 5:08 AM

## 2014-11-23 NOTE — Progress Notes (Signed)
EEG Completed; Results Pending  

## 2014-11-23 NOTE — Progress Notes (Signed)
eLink Physician-Brief Progress Note Patient Name: CYAN CLIPPINGER DOB: 08/24/1931 MRN: [id number]   Date of Service  11/23/2014  HPI/Events of Note  Hypokalemia  eICU Interventions  Potassium replaced     Intervention Category Intermediate Interventions: Electrolyte abnormality - evaluation and management  Naila Elizondo 11/23/2014, 6:59 AM

## 2014-11-23 NOTE — Progress Notes (Signed)
Initial Nutrition Assessment  DOCUMENTATION CODES:  Obesity unspecified  INTERVENTION:  Other (Comment) (RD will follow for vent status and initiation of nutrition support)   If pt unable to extubate within 48 hours of intubation, recommend initiation of nutrition support:  Once pt is re-warmed consider, Initiate Vital High Protein @ 20 ml/hr via OGT and increase by 10 ml every 4 hours to goal rate of 40 ml/hr.   Tube feeding regimen provides 960 kcal (100% of needs), 84 grams of protein, and 803 ml of H2O.   NUTRITION DIAGNOSIS:  Inadequate oral intake related to inability to eat as evidenced by NPO status.   GOAL:  Provide needs based on ASPEN/SCCM guidelines   MONITOR:  Vent status, Labs, Weight trends, I & O's, Skin  REASON FOR ASSESSMENT:  Ventilator    ASSESSMENT: Ms. Lipsey is a 81 y/o woman with a history listed below who presented to the ED with cardiac arrest hours following discharge. The patient was admitted two days ago for dyspnea and chest discomfort which was attributed to asthma exacerbation. She was treated and discharge on 6/17, around 16:00. Her family states that on arrival home she developed some dyspnea and chest discomfort, and then suddenly started gasping for breath  Pt admitted with cardiac arrest.   Patient is currently intubated on ventilator support. OGT intact.  MV: 6.4 L/min Temp (24hrs), Avg:91.9 F (33.3 C), Min:89.1 F (31.7 C), Max:98.7 F (37.1 C)  Propofol: n/a  Pt on hyperthermia protocol. Current temp 32.8.   Wt hx reveals no weight loss.   Nutrition-Focused physical exam completed. Findings are no fat depletion, mild muscle depletion, and no edema.   Labs reviewed: K: 3.4.  Height:  Ht Readings from Last 1 Encounters:  11/23/14 4\' 5"  (1.346 m)    Weight:  Wt Readings from Last 1 Encounters:  11/23/14 120 lb 13 oz (54.8 kg)    Ideal Body Weight:  40 kg  Wt Readings from Last 10 Encounters:  11/23/14 120 lb  13 oz (54.8 kg)  11/20/14 111 lb 8 oz (50.576 kg)  10/30/14 111 lb (50.349 kg)  07/13/14 101 lb (45.813 kg)  06/25/14 101 lb 4 oz (45.927 kg)  05/27/14 104 lb (47.174 kg)  05/24/14 112 lb (50.803 kg)  10/14/13 103 lb 6.4 oz (46.902 kg)  09/29/13 103 lb (46.72 kg)  02/14/08 105 lb (47.628 kg)    BMI:  Body mass index is 30.25 kg/(m^2). Obesity, class I  Estimated Nutritional Needs:  Kcal:  1066.8  Protein:  > 80 grams  Fluid:  > 1.2 L  Skin:  Reviewed, no issues  Diet Order:  NPO  EDUCATION NEEDS:  No education needs identified at this time   Intake/Output Summary (Last 24 hours) at 11/23/14 1248 Last data filed at 11/23/14 1200  Gross per 24 hour  Intake 3905.95 ml  Output   2280 ml  Net 1625.95 ml    Last BM:  11/21/14  Evani Shrider A. Jimmye Norman, RD, LDN, CDE Pager: [phone] After hours Pager: [phone]

## 2014-11-23 NOTE — Progress Notes (Signed)
CRITICAL VALUE ALERT  Critical value received:  Lactic Acid 2.5  Date of notification:  11/23/14  Time of notification:  0750  Critical value read back:Yes.    Nurse who received alert:  Bobbe Medico, RN  MD notified (1st page):  Dr. Elsworth Soho  Time of first page:  0830  MD notified (2nd page):  Time of second page:  Responding MD:  Dr. Elsworth Soho  Time MD responded:  [phone]

## 2014-11-23 NOTE — Consult Note (Signed)
CARDIOLOGY CONSULT NOTE  Assessment and Plan:  *Cardiac arrest/PEA arrest Laura May is an 81 year old female with history of alcohol abuse, asthma, hypertension, dementia was admitted to the hospital recently with asthma exacerbation and minimal troponin elevations. She was discharged earlier today but unfortunately sustained a PEA arrest. She was not resuscitated for about 5 minutes after which, she had a return of spontaneous circulation after receiving compressions and epinephrine. Cardiology is being consulted to assess the candidacy for hypothermia protocol. Patient after being off of propofol has no reliable neurological activity. She has no obvious contraindications for hypothermia protocol such as bleeding, coagulopathy, active malignancy, concern for sepsis, refractory shock. I discussed her case with the primary team who feels that patient may benefit from hypothermia protocol due to quick resuscitation.  Certainly, it is concerning to see significant elevations in the lactic acid suggesting prolong lack of perfusion. For now, there are no obvious contraindication for hypothermia protocol. Agree with proceeding.  In terms of etiology of the arrest, it does not appear to be primary cardiac in origin at this time as her most recent EKGs do not reveal acute coronary syndrome. In addition, bedside echocardiography suggest preserved ventricular function. For now, recommend conservative management from cardiac standpoint. --Continue aspirin 81 mg daily --Can continue diltiazem 120 mg daily if blood pressure allow  --Agree with correcting electrolyte abnormalities.  Thank you very much for this very using consult. We'll continue to follow along.   Chief complaint: PEA arrest   HPI:  Laura May is an 81 year old female with history of alcohol abuse, asthma, hypertension, dementia who is Jehovah's Witness comes to the emergency department after having a PEA cardiac arrest. Patient was  discharged from the hospital earlier today after being admitted to the hospital with asthma exacerbation and minimal troponin elevations. Cardiology was consulted. This admission and felt that asked by exacerbation was the cause of her symptoms. At the time of discharge from the family medicine service, patient was feeling well and was breathing well on room air. She was discharged on steroids. In addition, due to cardiac biomarker elevation to 0.19, patient had a transthoracic echocardiogram that showed preserved LV function with LVEF of 60-65%. An outpatient cardiology appointment for a stress test was made.  After being discharged from the hospital, patient went home and reportedly was seen normal about 5 minutes prior to cardiac arrest. In EMS was called who noticed the patient wasn't PEA arrest. Patient was resuscitated with epinephrine and compressions return of spontaneous circulation. She was intubated and was brought to the emergency department on propofol. Cardiology was consulted to assist with hypothermia protocol. EKG of note in the emergency department showed lateral ST depressions which resolved subsequently with correction for pH. Bedside echocardiogram showed preserved LV function. Labs showed lactate of 12 with potassium of 6.5. In addition, she had a CT scan of the head that showed no hemorrhage. She also had a CT scan of her chest that showed no evidence of emboli. Upon my evaluation, patient is currently on propofol. Reportedly, patient had mild clonus while sedation was off but no other neurological activity.  Past Medical History Past Medical History  Diagnosis Date  . Alcohol abuse   . Brain tumor   . Brain aneurysm   . Arthritis   . Hypertension   . Constipation     last colonoscopy 2011  . Colon polyps 06/07/2009  . Dementia   . Asthma   . Shortness of breath dyspnea   .  Refusal of blood transfusions as patient is Jehovah's Witness     Allergies: Allergies  Allergen  Reactions  . Fexofenadine Other (See Comments)    Unknown reaction    Social History History   Social History  . Marital Status: Widowed    Spouse Name: N/A  . Number of Children: N/A  . Years of Education: N/A   Occupational History  . Not on file.   Social History Main Topics  . Smoking status: Former Research scientist (life sciences)  . Smokeless tobacco: Former Systems developer    Types: Snuff  . Alcohol Use: Yes     Comment: Pint of bourbon per day - none for 4 days - 05/21/14   quit drinking march 2016  . Drug Use: No  . Sexual Activity: Not Currently   Other Topics Concern  . Not on file   Social History Narrative   Marital status: widowed since age 3; second husband.      Children: two children; 2 grandchildren; 7 gg      Lives: with daughter; moved from [place].      Employment: retired; previous Land work.      Tobacco: quit 15 years ago; dips snuff.      Alcohol:  Daily alcohol brandy 1/2 pint-1 pint; years.      ADLS:  No driving; never drove. Rare cooking; daughter cooks.      Family History Family History  Problem Relation Age of Onset  . Cancer Mother   . Heart disease Father     Physical Exam Filed Vitals:   11/23/14 0200  BP:   Pulse: 73  Temp: 91.2 F (32.9 C)  Resp: 28    Gen: Intubated HEENT: Moist because remains, ET tube in place Neck: Difficult to evaluate JVD but likely normal CV: Regular rate rhythm, normal S1, normal S2, no gallops, 2/6 systolic murmur best at the right upper sternal border Pulm: Clear to auscultation bilaterally Abdomen: Soft, nondistended Ext: No edema  Labs:  Results for orders placed or performed during the hospital encounter of 11/16/2014 (from the past 24 hour(s))  CBC with Differential/Platelet     Status: Abnormal   Collection Time: 12/01/2014  7:30 PM  Result Value Ref Range   WBC 18.1 (H) 4.0 - 10.5 K/uL   RBC 4.19 3.87 - 5.11 MIL/uL   Hemoglobin 9.5 (L) 12.0 - 15.0 g/dL   HCT 30.7 (L) 36.0 - 46.0 %   MCV 73.3 (L) 78.0 - 100.0 fL    MCH 22.7 (L) 26.0 - 34.0 pg   MCHC 30.9 30.0 - 36.0 g/dL   RDW 14.8 11.5 - 15.5 %   Platelets 331 150 - 400 K/uL   Neutrophils Relative % 76 43 - 77 %   Lymphocytes Relative 18 12 - 46 %   Monocytes Relative 5 3 - 12 %   Eosinophils Relative 1 0 - 5 %   Basophils Relative 0 0 - 1 %   Neutro Abs 13.7 (H) 1.7 - 7.7 K/uL   Lymphs Abs 3.3 0.7 - 4.0 K/uL   Monocytes Absolute 0.9 0.1 - 1.0 K/uL   Eosinophils Absolute 0.2 0.0 - 0.7 K/uL   Basophils Absolute 0.0 0.0 - 0.1 K/uL   RBC Morphology POLYCHROMASIA PRESENT    WBC Morphology MILD LEFT SHIFT (1-5% METAS, OCC MYELO, OCC BANDS)   Comprehensive metabolic panel     Status: Abnormal   Collection Time: 11/17/2014  7:30 PM  Result Value Ref Range   Sodium 139 135 -  145 mmol/L   Potassium 6.5 (HH) 3.5 - 5.1 mmol/L   Chloride 107 101 - 111 mmol/L   CO2 11 (L) 22 - 32 mmol/L   Glucose, Bld 266 (H) 65 - 99 mg/dL   BUN 25 (H) 6 - 20 mg/dL   Creatinine, Ser 1.79 (H) 0.44 - 1.00 mg/dL   Calcium 9.0 8.9 - 10.3 mg/dL   Total Protein 6.2 (L) 6.5 - 8.1 g/dL   Albumin 3.0 (L) 3.5 - 5.0 g/dL   AST 55 (H) 15 - 41 U/L   ALT 39 14 - 54 U/L   Alkaline Phosphatase 64 38 - 126 U/L   Total Bilirubin 0.6 0.3 - 1.2 mg/dL   GFR calc non Af Amer 25 (L) >60 mL/min   GFR calc Af Amer 29 (L) >60 mL/min   Anion gap 21 (H) 5 - 15  Troponin I     Status: Abnormal   Collection Time: 12/05/2014  7:30 PM  Result Value Ref Range   Troponin I 0.07 (H) <0.031 ng/mL  CBG monitoring, ED     Status: Abnormal   Collection Time: 11/16/2014  7:41 PM  Result Value Ref Range   Glucose-Capillary 223 (H) 65 - 99 mg/dL  I-Stat CG4 Lactic Acid, ED     Status: Abnormal   Collection Time: 11/16/2014  7:43 PM  Result Value Ref Range   Lactic Acid, Venous 11.27 (HH) 0.5 - 2.0 mmol/L   Comment NOTIFIED PHYSICIAN   Urinalysis, Routine w reflex microscopic (not at [facility])     Status: Abnormal   Collection Time: 11/20/2014 10:10 PM  Result Value Ref Range   Color, Urine YELLOW YELLOW    APPearance CLEAR CLEAR   Specific Gravity, Urine 1.022 1.005 - 1.030   pH 6.0 5.0 - 8.0   Glucose, UA 100 (A) NEGATIVE mg/dL   Hgb urine dipstick MODERATE (A) NEGATIVE   Bilirubin Urine NEGATIVE NEGATIVE   Ketones, ur NEGATIVE NEGATIVE mg/dL   Protein, ur 100 (A) NEGATIVE mg/dL   Urobilinogen, UA 0.2 0.0 - 1.0 mg/dL   Nitrite NEGATIVE NEGATIVE   Leukocytes, UA NEGATIVE NEGATIVE  Urine microscopic-add on     Status: None   Collection Time: 11/17/2014 10:10 PM  Result Value Ref Range   Squamous Epithelial / LPF RARE RARE   WBC, UA 0-2 <3 WBC/hpf   RBC / HPF 21-50 <3 RBC/hpf   Bacteria, UA RARE RARE  I-Stat arterial blood gas, ED     Status: Abnormal   Collection Time: 12/02/2014 11:11 PM  Result Value Ref Range   pH, Arterial 7.390 7.350 - 7.450   pCO2 arterial 25.6 (L) 35.0 - 45.0 mmHg   pO2, Arterial 76.0 (L) 80.0 - 100.0 mmHg   Bicarbonate 15.7 (L) 20.0 - 24.0 mEq/L   TCO2 16 0 - 100 mmol/L   O2 Saturation 96.0 %   Acid-base deficit 8.0 (H) 0.0 - 2.0 mmol/L   Patient temperature 96.8 F    Collection site RADIAL, ALLEN'S TEST ACCEPTABLE    Drawn by RT    Sample type ARTERIAL   Troponin I     Status: Abnormal   Collection Time: 11/23/14 12:28 AM  Result Value Ref Range   Troponin I 0.08 (H) <0.031 ng/mL  Protime-INR now and repeat in 8 hours     Status: None   Collection Time: 11/23/14 12:28 AM  Result Value Ref Range   Prothrombin Time 14.0 11.6 - 15.2 seconds   INR 1.06 0.00 - 1.49  APTT now and repeat in 8 hours     Status: None   Collection Time: 11/23/14 12:28 AM  Result Value Ref Range   aPTT 27 24 - 37 seconds  CBC     Status: Abnormal   Collection Time: 11/23/14 12:28 AM  Result Value Ref Range   WBC 13.7 (H) 4.0 - 10.5 K/uL   RBC 3.88 3.87 - 5.11 MIL/uL   Hemoglobin 8.8 (L) 12.0 - 15.0 g/dL   HCT 27.7 (L) 36.0 - 46.0 %   MCV 71.4 (L) 78.0 - 100.0 fL   MCH 22.7 (L) 26.0 - 34.0 pg   MCHC 31.8 30.0 - 36.0 g/dL   RDW 14.8 11.5 - 15.5 %   Platelets 274 150 - 400  K/uL  I-STAT, chem 8     Status: Abnormal   Collection Time: 11/23/14 12:42 AM  Result Value Ref Range   Sodium 140 135 - 145 mmol/L   Potassium 4.4 3.5 - 5.1 mmol/L   Chloride 112 (H) 101 - 111 mmol/L   BUN 26 (H) 6 - 20 mg/dL   Creatinine, Ser 1.20 (H) 0.44 - 1.00 mg/dL   Glucose, Bld 237 (H) 65 - 99 mg/dL   Calcium, Ion 1.21 1.13 - 1.30 mmol/L   TCO2 17 0 - 100 mmol/L   Hemoglobin 10.5 (L) 12.0 - 15.0 g/dL   HCT 31.0 (L) 36.0 - 46.0 %  I-STAT 3, arterial blood gas (G3+)     Status: Abnormal   Collection Time: 11/23/14 12:48 AM  Result Value Ref Range   pH, Arterial 7.348 (L) 7.350 - 7.450   pCO2 arterial 30.1 (L) 35.0 - 45.0 mmHg   pO2, Arterial 51.0 (L) 80.0 - 100.0 mmHg   Bicarbonate 17.5 (L) 20.0 - 24.0 mEq/L   TCO2 19 0 - 100 mmol/L   O2 Saturation 92.0 %   Acid-base deficit 9.0 (H) 0.0 - 2.0 mmol/L   Patient temperature 32.3 C    Collection site ARTERIAL LINE    Drawn by RT    Sample type ARTERIAL   I-STAT, chem 8     Status: Abnormal   Collection Time: 11/23/14  2:39 AM  Result Value Ref Range   Sodium 143 135 - 145 mmol/L   Potassium 3.6 3.5 - 5.1 mmol/L   Chloride 109 101 - 111 mmol/L   BUN 25 (H) 6 - 20 mg/dL   Creatinine, Ser 1.20 (H) 0.44 - 1.00 mg/dL   Glucose, Bld 204 (H) 65 - 99 mg/dL   Calcium, Ion 1.20 1.13 - 1.30 mmol/L   TCO2 19 0 - 100 mmol/L   Hemoglobin 10.5 (L) 12.0 - 15.0 g/dL   HCT 31.0 (L) 36.0 - 46.0 %

## 2014-11-24 DIAGNOSIS — G931 Anoxic brain damage, not elsewhere classified: Secondary | ICD-10-CM

## 2014-11-24 LAB — GLUCOSE, CAPILLARY
GLUCOSE-CAPILLARY: 121 mg/dL — AB (ref 65–99)
GLUCOSE-CAPILLARY: 113 mg/dL — AB (ref 65–99)
GLUCOSE-CAPILLARY: 101 mg/dL — AB (ref 65–99)
Glucose-Capillary: 104 mg/dL — ABNORMAL HIGH (ref 65–99)
Glucose-Capillary: 98 mg/dL (ref 65–99)

## 2014-11-24 LAB — BASIC METABOLIC PANEL
ANION GAP: 6 (ref 5–15)
ANION GAP: 6 (ref 5–15)
Anion gap: 7 (ref 5–15)
Anion gap: 8 (ref 5–15)
BUN: 16 mg/dL (ref 6–20)
BUN: 16 mg/dL (ref 6–20)
BUN: 17 mg/dL (ref 6–20)
BUN: 19 mg/dL (ref 6–20)
CALCIUM: 7.8 mg/dL — AB (ref 8.9–10.3)
CHLORIDE: 109 mmol/L (ref 101–111)
CO2: 20 mmol/L — ABNORMAL LOW (ref 22–32)
CO2: 22 mmol/L (ref 22–32)
CO2: 22 mmol/L (ref 22–32)
CO2: 23 mmol/L (ref 22–32)
CREATININE: 1.09 mg/dL — AB (ref 0.44–1.00)
CREATININE: 1.09 mg/dL — AB (ref 0.44–1.00)
CREATININE: 1.53 mg/dL — AB (ref 0.44–1.00)
Calcium: 7.8 mg/dL — ABNORMAL LOW (ref 8.9–10.3)
Calcium: 8 mg/dL — ABNORMAL LOW (ref 8.9–10.3)
Calcium: 8.2 mg/dL — ABNORMAL LOW (ref 8.9–10.3)
Chloride: 112 mmol/L — ABNORMAL HIGH (ref 101–111)
Chloride: 110 mmol/L (ref 101–111)
Chloride: 109 mmol/L (ref 101–111)
Creatinine, Ser: 1.14 mg/dL — ABNORMAL HIGH (ref 0.44–1.00)
GFR calc Af Amer: 54 mL/min — ABNORMAL LOW (ref >60)
GFR calc Af Amer: 54 mL/min — ABNORMAL LOW (ref >60)
GFR calc non Af Amer: 44 mL/min — ABNORMAL LOW (ref >60)
GFR calc non Af Amer: 46 mL/min — ABNORMAL LOW (ref >60)
GFR, EST AFRICAN AMERICAN: 51 mL/min — AB (ref >60)
GFR, EST AFRICAN AMERICAN: 36 mL/min — AB (ref >60)
GFR, EST NON AFRICAN AMERICAN: 46 mL/min — AB (ref >60)
GFR, EST NON AFRICAN AMERICAN: 31 mL/min — AB (ref >60)
GLUCOSE: 109 mg/dL — AB (ref 65–99)
GLUCOSE: 112 mg/dL — AB (ref 65–99)
Glucose, Bld: 113 mg/dL — ABNORMAL HIGH (ref 65–99)
Glucose, Bld: 125 mg/dL — ABNORMAL HIGH (ref 65–99)
POTASSIUM: 3.7 mmol/L (ref 3.5–5.1)
POTASSIUM: 3.6 mmol/L (ref 3.5–5.1)
POTASSIUM: 4 mmol/L (ref 3.5–5.1)
Potassium: 3.8 mmol/L (ref 3.5–5.1)
SODIUM: 141 mmol/L (ref 135–145)
SODIUM: 138 mmol/L (ref 135–145)
Sodium: 137 mmol/L (ref 135–145)
Sodium: 138 mmol/L (ref 135–145)

## 2014-11-24 MED ORDER — SODIUM CHLORIDE 0.9 % IJ SOLN: 10.0000 mL | INTRAMUSCULAR | Status: DC | PRN

## 2014-11-24 MED ORDER — SODIUM CHLORIDE 0.9 % IV SOLN
INTRAVENOUS | Status: DC
  Administered 2014-11-24 – 2014-11-26 (×2): 250 mL via INTRAVENOUS

## 2014-11-24 MED ORDER — NOREPINEPHRINE BITARTRATE 1 MG/ML IV SOLN
0.0000 ug/min | INTRAVENOUS | Status: DC
  Administered 2014-11-24: 16 ug/min via INTRAVENOUS
  Filled 2014-11-24: qty 16

## 2014-11-24 MED ORDER — SODIUM CHLORIDE 0.9 % IJ SOLN
3.0000 mL | INTRAMUSCULAR | Status: DC | PRN
  Administered 2014-11-24: 3 mL via INTRAVENOUS

## 2014-11-24 NOTE — Progress Notes (Signed)
PULMONARY / CRITICAL CARE MEDICINE HISTORY AND PHYSICAL EXAMINATION   Name: Laura May MRN: [id number] DOB: 1931-11-13    ADMISSION DATE:  11/29/2014  PRIMARY SERVICE: PCCM  CHIEF COMPLAINT:  S/p cardiac arrest  BRIEF PATIENT DESCRIPTION: 81 y/o woman presents with cardiac arrest following hospital discharge  [place] / STUDIES:  Out of hospital arrest Head CT 6/17 > no m/s/b  LINES / TUBES: 6/18 7 mm ETT >> Art line 6/17 CVC R IJ 6/18 OGT 6/17 Foley 6/17  CULTURES: Urine 6/17 Blood 6/17 x2 >>  ANTIBIOTICS: Vanc 6/17-> Pip-tazo 6/17->  HISTORY OF PRESENT ILLNESS:   [name] is a 81 y/o woman with a history listed below who presented to the ED with cardiac arrest hours following discharge. The patient was admitted two days ago for dyspnea and chest discomfort which was attributed to asthma exacerbation. She was treated and discharge on 6/17, around 16:00. Her family states that on arrival home she developed some dyspnea and chest discomfort, and then suddenly started gasping for breath. EMS was called, and 911 dispatcher directed the family to perform a basic assessment, and the patient was found to be apneic and pulseless. She was without adequate resusitatitive measures for about 15 minutes until EMS arrived. She received ACLS for another 15 minutes and ROSC was achieved. In the ED, she seemed to be responding to noxious stimulous, and PCCM was called for admission with possible cooling.    SUBJECTIVE:  Rewarming  Poor UO   VITAL SIGNS: Temp:  [91 F (32.8 C)-96.6 F (35.9 C)] 96.6 F (35.9 C) (06/19 1000) Pulse Rate:  [50-81] 81 (06/19 0900) Resp:  [10-21] 20 (06/19 1000) BP: (117-148)/(48-58) 148/54 mmHg (06/19 0347) SpO2:  [95 %-100 %] 95 % (06/19 0900) Arterial Line BP: (82-166)/(38-63) 129/50 mmHg (06/19 1000) FiO2 (%):  [40 %-50 %] 40 % (06/19 0833) HEMODYNAMICS: CVP:  [3 mmHg-13 mmHg] 3 mmHg VENTILATOR SETTINGS: Vent Mode:  [-] PRVC FiO2  (%):  [40 %-50 %] 40 % Set Rate:  [20 bmp] 20 bmp Vt Set:  [300 mL] 300 mL PEEP:  [8 cmH20] 8 cmH20 Plateau Pressure:  [21 cmH20-23 cmH20] 21 cmH20 INTAKE / OUTPUT: Intake/Output      06/18 0701 - 06/19 0700 06/19 0701 - 06/20 0700   I.V. (mL/kg) 1517.6 (27.7) 101.1 (1.8)   IV Piggyback 200    Total Intake(mL/kg) 1717.6 (31.3) 101.1 (1.8)   Urine (mL/kg/hr) 685 (0.5)    Emesis/NG output 650 (0.5)    Total Output 1335     Net +382.6 +101.1          PHYSICAL EXAMINATION: General:  Elderly woman intubated and sedated Neuro:  Pinpoint pupils, Non-purposeful response to noxious stimulus. HEENT:  MMM Neck: Trachea is midline Cardiovascular:  RRR, No M/R/G Lungs:  CTAB Abdomen:  Soft Musculoskeletal: good pulses Skin:  No rashes.  LABS:  CBC  Recent Labs Lab 11/19/2014 1930 11/23/14 0028  11/23/14 0500  11/23/14 1235 11/23/14 1554 11/23/14 1959  WBC 18.1* 13.7*  --  16.2*  --   --   --   --   HGB 9.5* 8.8*  < > 8.6*  < > 10.9* 11.2* 11.2*  HCT 30.7* 27.7*  < > 27.6*  < > 32.0* 33.0* 33.0*  PLT 331 274  --  276  --   --   --   --   < > = values in this interval not displayed. Coag's  Recent Labs Lab 11/23/14 0028 11/23/14  0750  APTT 27 28  INR 1.06 1.05   BMET  Recent Labs Lab 11/23/14 2350 11/24/14 0400 11/24/14 0815  NA 137 141 138  K 3.8 3.7 3.6  CL 109 112* 110  CO2 20* 23 22  BUN 19 16 17   CREATININE 1.09* 1.14* 1.09*  GLUCOSE 113* 109* 125*   Electrolytes  Recent Labs Lab 11/23/14 2350 11/24/14 0400 11/24/14 0815  CALCIUM 7.8* 8.2* 7.8*   Sepsis Markers  Recent Labs Lab 11/23/14 0255 11/23/14 0635 11/23/14 0811  LATICACIDVEN 2.0 2.5* 1.45   ABG  Recent Labs Lab 11/10/2014 2311 11/23/14 0048 11/23/14 0830  PHART 7.390 7.348* 7.415  PCO2ART 25.6* 30.1* 30.9*  PO2ART 76.0* 51.0* 78.0*   Liver Enzymes  Recent Labs Lab 11/25/2014 1930  AST 55*  ALT 39  ALKPHOS 64  BILITOT 0.6  ALBUMIN 3.0*   Cardiac Enzymes  Recent  Labs Lab 11/23/14 0500 11/23/14 1000 11/23/14 2030  TROPONINI 0.08* 0.08* 0.07*   Glucose  Recent Labs Lab 11/23/14 1759 11/23/14 1956 11/23/14 2204 11/23/14 2350 11/24/14 0405 11/24/14 0820  GLUCAP 150* 133* 116* 97 104* 121*    Imaging Ct Head Wo Contrast  11/12/2014   CLINICAL DATA:  Status post cardiopulmonary resuscitation. Altered mental status/ syncope  EXAM: CT HEAD WITHOUT CONTRAST  TECHNIQUE: Contiguous axial images were obtained from the base of the skull through the vertex without intravenous contrast.  COMPARISON:  Head CT May 23, 2014; brain MRI May 24, 2014  FINDINGS: There is mild diffuse atrophy, stable. There is no intracranial mass, hemorrhage, extra-axial fluid collection, or midline shift. There is mild patchy small vessel disease in the centra semiovale bilaterally. There is no new gray-white compartment lesion. No acute infarct evident.  The patient is status post previous right frontal and temporal craniotomy. Bony calvarium otherwise appears intact. Mastoid air cells are clear. There is extensive debris throughout the nasopharynx. There is an air-fluid level in the left maxillary antrum. There are air-fluid levels in each maxillary antrum. Most of the ethmoid air cells are opacified. There is a stable bony defect in the lamina papyracea on the left.  IMPRESSION: Atrophy with mild stable periventricular small vessel disease. No intracranial mass, hemorrhage, or acute appearing infarct. Bony defect on the left from previous craniotomy. Extensive paranasal sinus disease as well as diffuse opacification of the nares and visualize nasopharynx. Question hemorrhage in these areas given the recent CPR.   Electronically Signed   By: Lowella Grip III M.D.   On:  20:42   Ct Angio Chest Pe W/cm &/or Wo Cm  11/26/2014   CLINICAL DATA:  Post CPR, cardiac arrest  EXAM: CT ANGIOGRAPHY CHEST WITH CONTRAST  TECHNIQUE: Multidetector CT imaging of the chest was  performed using the standard protocol during bolus administration of intravenous contrast. Multiplanar CT image reconstructions and MIPs were obtained to evaluate the vascular anatomy.  CONTRAST:  20mL OMNIPAQUE IOHEXOL 350 MG/ML SOLN  COMPARISON:  Chest radiograph same date  FINDINGS: Mediastinum/Nodes: Endotracheal tube tip is within the right mainstem bronchus. Nasogastric tube in place with fluid within the esophagus or diffuse esophageal wall thickening, unable to further differentiate given technique at the current study. Heart size is normal. No pericardial effusion. Moderate atheromatous aortic and coronary arterial calcification.  The examination is adequate for evaluation for acute pulmonary embolism up to and including the 3rd order pulmonary arteries. No focal filling defect is identified up to and including the 3rd order pulmonary arteries to suggest acute pulmonary  embolism.  Lungs/Pleura: Right upper lobe consolidation as well as areas of curvilinear scarring or atelectasis noted. Subpleural bilateral lower lobe atelectasis or scarring noted. No measurable mass, nodule, or consolidation is otherwise identified. No pleural effusion.  Upper abdomen: Right upper renal pole cyst measures 5.5 cm image 80. Nasogastric tube tip terminates at least at the distal stomach but is not included in the field of view. Adrenal glands are normal in their visualized aspects.  Musculoskeletal: No acute osseous abnormality.  Review of the MIP images confirms the above findings.  IMPRESSION: No CT evidence for acute pulmonary embolism.  Patchy right upper lobe consolidation which may indicate pneumonia although aspiration or other alveolar filling processes could appear similar.  Right mainstem bronchus intubation. Consider pulling back endotracheal tube 3-4 cm for more optimal positioning.  Fluid filled or thick-walled esophagus.  Critical Value/emergent results were called by telephone at the time of interpretation on  11/15/2014 at 8:46 pm to RN Raquel Sarna who verbally acknowledged these results.   Electronically Signed   By: Conchita Paris M.D.   On: 11/24/2014 20:47   Dg Chest Portable 1 View  11/23/2014   CLINICAL DATA:  Shortness of breath.  Central venous catheter.  EXAM: PORTABLE CHEST - 1 VIEW  COMPARISON:  11/23/2014  FINDINGS: Endotracheal tube with tip measuring 3.3 cm above the carina. Enteric tube is below the field of view but in the upper abdomen consistent with location at least in the distal stomach. Right central venous catheter with tip over the cavoatrial junction. Normal heart size and pulmonary vascularity. Linear infiltration or atelectasis in the right mid lung and left lung base. No blunting of costophrenic angles. No pneumothorax.  IMPRESSION: Appliances appear in satisfactory location. Persistent linear atelectasis or infiltration in the right mid lung and left lung base.   Electronically Signed   By: Lucienne Capers M.D.   On: 11/23/2014 02:48   Dg Chest Portable 1 View  11/23/2014   CLINICAL DATA:  81 year old female central venous catheter placement. Status post cardiac arrest, CPR. Initial encounter.  EXAM: PORTABLE CHEST - 1 VIEW  COMPARISON:  Chest CTA 11/16/2014 and earlier.  FINDINGS: Portable AP semi upright view at 0132 hrs.  The endotracheal tube tip now projects about 8 mm above the carina. This could be retracted slightly farther more optimal positioning.  An enteric tube is been placed, coursing to the upper abdomen and probably crossing midline, but tip is not included.  Right IJ central line is been placed. Tip projects at the right atrium level, about 3 cm below the cavoatrial junction.  There is continued Patchy and confluent right upper lobe opacity as seen on the recent CTA. Stable cardiac size and mediastinal contours. No pneumothorax. Resuscitation or pacer pad still project over the chest. Mildly increased streaky perihilar opacity on the left.  IMPRESSION: 1. Right IJ central  line placed, tip at the level of the right atrium. Retract 2-3 cm for cavoatrial junction level placement. 2. ETT tip now 8 mm above the carina. 3. Enteric tube placed and courses to the abdomen, tip not included. 4. No pneumothorax. Continued Patchy and confluent right upper lobe opacity seen on the recent CTA. Mildly increased left perihilar opacity.   Electronically Signed   By: Genevie Ann M.D.   On: 11/23/2014 01:49   Dg Chest Port 1 View  11/11/2014   CLINICAL DATA:  Cardiac arrest.  Intubated patient  EXAM: PORTABLE CHEST - 1 VIEW  COMPARISON:  Radiograph 11/20/2014  FINDINGS: Endotracheal tube position all at the level the carina and directed towards the right mainstem bronchus. Consider retraction by 3 to 4 cm. Normal cardiac silhouette. No pulmonary edema. No pleural fluid. No pneumothorax.  IMPRESSION: 1. Endotracheal tube is essentially at the carina. Consider retraction by 3 to 4 cm. 2. No atelectasis, edema, or infiltrate.   Electronically Signed   By: Suzy Bouchard M.D.   On: 11/25/2014 19:43    EKG: NSR with peaked T-waves CXR: patchy infiltrate, R>L. Elevated R hemidiaphragm.  ASSESSMENT / PLAN:  Active Problems:   Cardiac arrest   Altered mental status   PULMONARY A: Acute hypoxic resp failure Possible aspiration CT angio chest 6/17 >> no PE P:  Vent protocol   CARDIOVASCULAR A: S/p cardiac arrest ,unclear cause Less likely ACS Hypertensive on arrival - hypotensive later , cardiogenic shock P:   Cardiology following. Cooling protocol initiated. -On levophed gtt  RENAL A: Hyperkalemia AKI Severe gap metabolic acidosis due to lactic acidosis, now improved P:   Replete lytes as needed  GASTROINTESTINAL A: No active issues P:   Consider TFs   HEMATOLOGIC A: No active issues P:   Patient should be considered coagulopathic while cool  INFECTIOUS A: Aspiration P:  hold off abx  ENDOCRINE A: No active issues P:  CBG while  npo  NEUROLOGIC A: Anoxic encephalopathy EEG 6/18 diffuse cerebral dysfunction P:   neurocheck once off nimbex & sedation     Family meeting note: 6/ 17 A long discussion was had with the patient's family, including daughter (de-factor POA). Her prognosis is likely poor, but in the acute setting while cooled and on sedatitives, the certainty of that assessment is low. Discussed that patient's prior code status was DNR and how that informs our decisions going forward. The consensus was that the patient was "tired" of her situation and was concerned about low quality of life. However, family members did not believe that the patient would be opposed to temporary invasive measures if they offered the hope of a retained quality of life.- if we did ascertain significant neurologic injury, the decision most consistent with her stated wishes would be terminal extubation. The family noted agreement.  TODAY'S SUMMARY: 81 y/o woman recently discharged developed out-of-hospital cardiac arrest at high risk for anxic injury  I have personally obtained a history, examined the patient, evaluated laboratory and imaging results, formulated the assessment and plan and placed orders.  CRITICAL CARE: The patient is critically ill with multiple organ systems failure and requires high complexity decision making for assessment and support, frequent evaluation and titration of therapies, application of advanced monitoring technologies and extensive interpretation of multiple databases. Critical Care Time devoted to patient care services described in this note is 35 minutes.   Kara Mead MD. [place] Flood. [facility] Pager [phone] If no response call [phone]    11/24/2014, 10:43 AM

## 2014-11-24 NOTE — Progress Notes (Signed)
Dr Elsworth Soho called with patient update. Informed him U/O only 10-20cc per hour. No new orders at this time, will continue to monitor.

## 2014-11-24 NOTE — Progress Notes (Signed)
  Overnight events - including EEG results -reviewed.  No new suggestions at this point.   Prognosis appears poor.   Bensimhon, Daniel,MD 10:08 AM

## 2014-11-24 NOTE — Progress Notes (Signed)
Utilization review completed.  

## 2014-11-24 NOTE — Progress Notes (Signed)
Wasted 100 mL fentanyl and 30 mL of Versed in the sink.  Diannia Ruder, RN witnessed same.

## 2014-11-24 NOTE — Progress Notes (Signed)
PULMONARY / CRITICAL CARE MEDICINE HISTORY AND PHYSICAL EXAMINATION   Name: Laura May MRN: [id number] DOB: 01-09-1932    ADMISSION DATE:  11/08/2014  PRIMARY SERVICE: PCCM  CHIEF COMPLAINT:  S/p cardiac arrest  BRIEF PATIENT DESCRIPTION: 81 y/o woman presents with cardiac arrest following hospital discharge  [place] / STUDIES:  Out of hospital arrest  LINES / TUBES: 6/18 7 mm ETT >> Art line 6/17 CVC R IJ 6/18 OGT 6/17 Foley 6/17  CULTURES: Urine 6/17 Blood 6/17 x2 >>  ANTIBIOTICS: Vanc 6/17-> Pip-tazo 6/17->  HISTORY OF PRESENT ILLNESS:   [name] is a 81 y/o woman with a history listed below who presented to the ED with cardiac arrest hours following discharge. The patient was admitted two days ago for dyspnea and chest discomfort which was attributed to asthma exacerbation. She was treated and discharge on 6/17, around 16:00. Her family states that on arrival home she developed some dyspnea and chest discomfort, and then suddenly started gasping for breath. EMS was called, and 911 dispatcher directed the family to perform a basic assessment, and the patient was found to be apneic and pulseless. She was without adequate resusitatitive measures for about 15 minutes until EMS arrived. She received ACLS for another 15 minutes and ROSC was achieved. In the ED, she seemed to be responding to noxious stimulous, and PCCM was called for admission with possible cooling.    SUBJECTIVE: on hypothermia protocol  VITAL SIGNS: Temp:  [91 F (32.8 C)-96.6 F (35.9 C)] 96.6 F (35.9 C) (06/19 1000) Pulse Rate:  [50-81] 81 (06/19 0900) Resp:  [10-21] 20 (06/19 1000) BP: (117-148)/(48-58) 148/54 mmHg (06/19 0347) SpO2:  [95 %-100 %] 95 % (06/19 0900) Arterial Line BP: (82-166)/(38-63) 129/50 mmHg (06/19 1000) FiO2 (%):  [40 %-50 %] 40 % (06/19 0833) HEMODYNAMICS: CVP:  [3 mmHg-13 mmHg] 3 mmHg VENTILATOR SETTINGS: Vent Mode:  [-] PRVC FiO2 (%):  [40 %-50 %] 40  % Set Rate:  [20 bmp] 20 bmp Vt Set:  [300 mL] 300 mL PEEP:  [8 cmH20] 8 cmH20 Plateau Pressure:  [21 cmH20-23 cmH20] 21 cmH20 INTAKE / OUTPUT: Intake/Output      06/18 0701 - 06/19 0700 06/19 0701 - 06/20 0700   I.V. (mL/kg) 1517.6 (27.7) 101.1 (1.8)   IV Piggyback 200    Total Intake(mL/kg) 1717.6 (31.3) 101.1 (1.8)   Urine (mL/kg/hr) 685 (0.5)    Emesis/NG output 650 (0.5)    Total Output 1335     Net +382.6 +101.1          PHYSICAL EXAMINATION: General:  Elderly woman intubated and sedated Neuro:  Non-purposeful response to noxious stimulus. HEENT:  MMM Neck: Trachea is midline Cardiovascular:  RRR, No M/R/G Lungs:  CTAB Abdomen:  Soft Musculoskeletal:  I/O in place on R shin, Skin:  No rashes.  LABS:  CBC  Recent Labs Lab 11/20/2014 1930 11/23/14 0028  11/23/14 0500  11/23/14 1235 11/23/14 1554 11/23/14 1959  WBC 18.1* 13.7*  --  16.2*  --   --   --   --   HGB 9.5* 8.8*  < > 8.6*  < > 10.9* 11.2* 11.2*  HCT 30.7* 27.7*  < > 27.6*  < > 32.0* 33.0* 33.0*  PLT 331 274  --  276  --   --   --   --   < > = values in this interval not displayed. Coag's  Recent Labs Lab 11/23/14 0028 11/23/14 0750  APTT 27 28  INR 1.06 1.05   BMET  Recent Labs Lab 11/23/14 2350 11/24/14 0400 11/24/14 0815  NA 137 141 138  K 3.8 3.7 3.6  CL 109 112* 110  CO2 20* 23 22  BUN 19 16 17   CREATININE 1.09* 1.14* 1.09*  GLUCOSE 113* 109* 125*   Electrolytes  Recent Labs Lab 11/23/14 2350 11/24/14 0400 11/24/14 0815  CALCIUM 7.8* 8.2* 7.8*   Sepsis Markers  Recent Labs Lab 11/23/14 0255 11/23/14 0635 11/23/14 0811  LATICACIDVEN 2.0 2.5* 1.45   ABG  Recent Labs Lab 11/25/2014 2311 11/23/14 0048 11/23/14 0830  PHART 7.390 7.348* 7.415  PCO2ART 25.6* 30.1* 30.9*  PO2ART 76.0* 51.0* 78.0*   Liver Enzymes  Recent Labs Lab 11/20/2014 1930  AST 55*  ALT 39  ALKPHOS 64  BILITOT 0.6  ALBUMIN 3.0*   Cardiac Enzymes  Recent Labs Lab 11/23/14 0500  11/23/14 1000 11/23/14 2030  TROPONINI 0.08* 0.08* 0.07*   Glucose  Recent Labs Lab 11/23/14 1759 11/23/14 1956 11/23/14 2204 11/23/14 2350 11/24/14 0405 11/24/14 0820  GLUCAP 150* 133* 116* 97 104* 121*    Imaging Ct Head Wo Contrast  11/06/2014   CLINICAL DATA:  Status post cardiopulmonary resuscitation. Altered mental status/ syncope  EXAM: CT HEAD WITHOUT CONTRAST  TECHNIQUE: Contiguous axial images were obtained from the base of the skull through the vertex without intravenous contrast.  COMPARISON:  Head CT May 23, 2014; brain MRI May 24, 2014  FINDINGS: There is mild diffuse atrophy, stable. There is no intracranial mass, hemorrhage, extra-axial fluid collection, or midline shift. There is mild patchy small vessel disease in the centra semiovale bilaterally. There is no new gray-white compartment lesion. No acute infarct evident.  The patient is status post previous right frontal and temporal craniotomy. Bony calvarium otherwise appears intact. Mastoid air cells are clear. There is extensive debris throughout the nasopharynx. There is an air-fluid level in the left maxillary antrum. There are air-fluid levels in each maxillary antrum. Most of the ethmoid air cells are opacified. There is a stable bony defect in the lamina papyracea on the left.  IMPRESSION: Atrophy with mild stable periventricular small vessel disease. No intracranial mass, hemorrhage, or acute appearing infarct. Bony defect on the left from previous craniotomy. Extensive paranasal sinus disease as well as diffuse opacification of the nares and visualize nasopharynx. Question hemorrhage in these areas given the recent CPR.   Electronically Signed   By: Lowella Grip III M.D.   On: 11/18/2014 20:42   Ct Angio Chest Pe W/cm &/or Wo Cm  11/26/2014   CLINICAL DATA:  Post CPR, cardiac arrest  EXAM: CT ANGIOGRAPHY CHEST WITH CONTRAST  TECHNIQUE: Multidetector CT imaging of the chest was performed using the  standard protocol during bolus administration of intravenous contrast. Multiplanar CT image reconstructions and MIPs were obtained to evaluate the vascular anatomy.  CONTRAST:  57mL OMNIPAQUE IOHEXOL 350 MG/ML SOLN  COMPARISON:  Chest radiograph same date  FINDINGS: Mediastinum/Nodes: Endotracheal tube tip is within the right mainstem bronchus. Nasogastric tube in place with fluid within the esophagus or diffuse esophageal wall thickening, unable to further differentiate given technique at the current study. Heart size is normal. No pericardial effusion. Moderate atheromatous aortic and coronary arterial calcification.  The examination is adequate for evaluation for acute pulmonary embolism up to and including the 3rd order pulmonary arteries. No focal filling defect is identified up to and including the 3rd order pulmonary arteries to suggest acute pulmonary embolism.  Lungs/Pleura: Right upper lobe  consolidation as well as areas of curvilinear scarring or atelectasis noted. Subpleural bilateral lower lobe atelectasis or scarring noted. No measurable mass, nodule, or consolidation is otherwise identified. No pleural effusion.  Upper abdomen: Right upper renal pole cyst measures 5.5 cm image 80. Nasogastric tube tip terminates at least at the distal stomach but is not included in the field of view. Adrenal glands are normal in their visualized aspects.  Musculoskeletal: No acute osseous abnormality.  Review of the MIP images confirms the above findings.  IMPRESSION: No CT evidence for acute pulmonary embolism.  Patchy right upper lobe consolidation which may indicate pneumonia although aspiration or other alveolar filling processes could appear similar.  Right mainstem bronchus intubation. Consider pulling back endotracheal tube 3-4 cm for more optimal positioning.  Fluid filled or thick-walled esophagus.  Critical Value/emergent results were called by telephone at the time of interpretation on 11/10/2014 at 8:46 pm  to RN Raquel Sarna who verbally acknowledged these results.   Electronically Signed   By: Conchita Paris M.D.   On: 11/21/2014 20:47   Dg Chest Portable 1 View  11/23/2014   CLINICAL DATA:  Shortness of breath.  Central venous catheter.  EXAM: PORTABLE CHEST - 1 VIEW  COMPARISON:  11/23/2014  FINDINGS: Endotracheal tube with tip measuring 3.3 cm above the carina. Enteric tube is below the field of view but in the upper abdomen consistent with location at least in the distal stomach. Right central venous catheter with tip over the cavoatrial junction. Normal heart size and pulmonary vascularity. Linear infiltration or atelectasis in the right mid lung and left lung base. No blunting of costophrenic angles. No pneumothorax.  IMPRESSION: Appliances appear in satisfactory location. Persistent linear atelectasis or infiltration in the right mid lung and left lung base.   Electronically Signed   By: Lucienne Capers M.D.   On: 11/23/2014 02:48   Dg Chest Portable 1 View  11/23/2014   CLINICAL DATA:  81 year old female central venous catheter placement. Status post cardiac arrest, CPR. Initial encounter.  EXAM: PORTABLE CHEST - 1 VIEW  COMPARISON:  Chest CTA 11/20/2014 and earlier.  FINDINGS: Portable AP semi upright view at 0132 hrs.  The endotracheal tube tip now projects about 8 mm above the carina. This could be retracted slightly farther more optimal positioning.  An enteric tube is been placed, coursing to the upper abdomen and probably crossing midline, but tip is not included.  Right IJ central line is been placed. Tip projects at the right atrium level, about 3 cm below the cavoatrial junction.  There is continued Patchy and confluent right upper lobe opacity as seen on the recent CTA. Stable cardiac size and mediastinal contours. No pneumothorax. Resuscitation or pacer pad still project over the chest. Mildly increased streaky perihilar opacity on the left.  IMPRESSION: 1. Right IJ central line placed, tip at the  level of the right atrium. Retract 2-3 cm for cavoatrial junction level placement. 2. ETT tip now 8 mm above the carina. 3. Enteric tube placed and courses to the abdomen, tip not included. 4. No pneumothorax. Continued Patchy and confluent right upper lobe opacity seen on the recent CTA. Mildly increased left perihilar opacity.   Electronically Signed   By: Genevie Ann M.D.   On: 11/23/2014 01:49   Dg Chest Port 1 View  12/05/2014   CLINICAL DATA:  Cardiac arrest.  Intubated patient  EXAM: PORTABLE CHEST - 1 VIEW  COMPARISON:  Radiograph 11/20/2014  FINDINGS: Endotracheal tube position all at  the level the carina and directed towards the right mainstem bronchus. Consider retraction by 3 to 4 cm. Normal cardiac silhouette. No pulmonary edema. No pleural fluid. No pneumothorax.  IMPRESSION: 1. Endotracheal tube is essentially at the carina. Consider retraction by 3 to 4 cm. 2. No atelectasis, edema, or infiltrate.   Electronically Signed   By: Suzy Bouchard M.D.   On: 11/10/2014 19:43    EKG: NSR with peaked T-waves CXR: patchy infiltrate, R>L. Elevated R hemidiaphragm.  ASSESSMENT / PLAN:  Active Problems:   Cardiac arrest   Altered mental status   PULMONARY A: Acute hypoxic resp failure Possible aspiration P:  Vent protocol   CARDIOVASCULAR A: S/p cardiac arrest Less likely ACS Hypertensive on arrival - hypotensive later , cardiogenic shock P:   Cardiology following. Cooling protocol initiated. -On levophed gtt  RENAL A: Hyperkalemia AKI Severe gap metabolic acidosis due to lactic acidosis, now improved P:   Replete lytes as needed  GASTROINTESTINAL A: No active issues P:   Consider TFs   HEMATOLOGIC A: No active issues P:   Patient should be considered coagulopathic while cool  INFECTIOUS A: Aspiration P:  hols off abx  ENDOCRINE A: No active issues P:    NEUROLOGIC A: Severe global ischemic injury P:   EEG per protocol Use versed instead of  propofol      Family meeting note: 6/ 17 A long discussion was had with the patient's family, including daughter (de-factor POA). Her prognosis is likely poor, but in the acute setting while cooled and on sedatitives, the certainty of that assessment is low. Discussed that patient's prior code status was DNR and how that informs our decisions going forward. The consensus was that the patient was "tired" of her situation and was concerned about low quality of life. However, family members did not believe that the patient would be opposed to temporary invasive measures if they offered the hope of a retained quality of life.- if we did ascertain significant neurologic injury, the decision most consistent with her stated wishes would be terminal extubation. The family noted agreement.  TODAY'S SUMMARY: 81 y/o woman recently discharged developed out-of-hospital cardiac arrest at risk for anxic injury  I have personally obtained a history, examined the patient, evaluated laboratory and imaging results, formulated the assessment and plan and placed orders.  CRITICAL CARE: The patient is critically ill with multiple organ systems failure and requires high complexity decision making for assessment and support, frequent evaluation and titration of therapies, application of advanced monitoring technologies and extensive interpretation of multiple databases. Critical Care Time devoted to patient care services described in this note is 35 minutes.   Kara Mead MD. [place]. [facility] Pager [phone] If no response call [phone]    11/24/2014, 10:35 AM

## 2014-11-25 ENCOUNTER — Inpatient Hospital Stay (HOSPITAL_COMMUNITY): Payer: Medicare Other

## 2014-11-25 DIAGNOSIS — R4020 Unspecified coma: Secondary | ICD-10-CM

## 2014-11-25 DIAGNOSIS — R40243 Glasgow coma scale score 3-8: Secondary | ICD-10-CM

## 2014-11-25 DIAGNOSIS — J9601 Acute respiratory failure with hypoxia: Secondary | ICD-10-CM

## 2014-11-25 DIAGNOSIS — Z66 Do not resuscitate: Secondary | ICD-10-CM

## 2014-11-25 LAB — BASIC METABOLIC PANEL
Anion gap: 8 (ref 5–15)
BUN: 19 mg/dL (ref 6–20)
CO2: 21 mmol/L — AB (ref 22–32)
Calcium: 8.1 mg/dL — ABNORMAL LOW (ref 8.9–10.3)
Chloride: 109 mmol/L (ref 101–111)
Creatinine, Ser: 1.78 mg/dL — ABNORMAL HIGH (ref 0.44–1.00)
GFR calc Af Amer: 30 mL/min — ABNORMAL LOW (ref >60)
GFR, EST NON AFRICAN AMERICAN: 26 mL/min — AB (ref >60)
GLUCOSE: 84 mg/dL (ref 65–99)
POTASSIUM: 3.7 mmol/L (ref 3.5–5.1)
Sodium: 138 mmol/L (ref 135–145)

## 2014-11-25 LAB — GLUCOSE, CAPILLARY
GLUCOSE-CAPILLARY: 80 mg/dL (ref 65–99)
Glucose-Capillary: 96 mg/dL (ref 65–99)
Glucose-Capillary: 96 mg/dL (ref 65–99)
Glucose-Capillary: 83 mg/dL (ref 65–99)
Glucose-Capillary: 84 mg/dL (ref 65–99)

## 2014-11-25 LAB — CBC
HCT: 27.8 % — ABNORMAL LOW (ref 36.0–46.0)
HEMOGLOBIN: 8.9 g/dL — AB (ref 12.0–15.0)
MCH: 22.8 pg — ABNORMAL LOW (ref 26.0–34.0)
MCHC: 32 g/dL (ref 30.0–36.0)
MCV: 71.1 fL — ABNORMAL LOW (ref 78.0–100.0)
Platelets: 249 10*3/uL (ref 150–400)
RBC: 3.91 MIL/uL (ref 3.87–5.11)
RDW: 15.1 % (ref 11.5–15.5)
WBC: 12.3 10*3/uL — AB (ref 4.0–10.5)

## 2014-11-25 MED ORDER — PROPOFOL 1000 MG/100ML IV EMUL
5.0000 ug/kg/min | INTRAVENOUS | Status: DC
  Administered 2014-11-25: 5 ug/kg/min via INTRAVENOUS
  Administered 2014-11-26: 20 ug/kg/min via INTRAVENOUS
  Administered 2014-11-26: 50 ug/kg/min via INTRAVENOUS
  Administered 2014-11-26: 40 ug/kg/min via INTRAVENOUS
  Administered 2014-11-26: 40.146 ug/kg/min via INTRAVENOUS
  Administered 2014-11-27: 50 ug/kg/min via INTRAVENOUS
  Filled 2014-11-25 (×7): qty 100

## 2014-11-25 MED ORDER — FENTANYL CITRATE (PF) 100 MCG/2ML IJ SOLN
25.0000 ug | INTRAMUSCULAR | Status: DC | PRN
  Administered 2014-11-26: 25 ug via INTRAVENOUS
  Filled 2014-11-25: qty 2

## 2014-11-25 MED ORDER — SODIUM CHLORIDE 0.9 % IV SOLN
INTRAVENOUS | Status: DC
  Administered 2014-11-25: 250 mL via INTRAVENOUS

## 2014-11-25 NOTE — Progress Notes (Signed)
Dr. Chase Caller updated on continued tachypnea.   Will continue to titrate up sedation, and monitor pt closely.

## 2014-11-25 NOTE — Progress Notes (Signed)
Getting tachypneic Start diprivan  Dr. Brand Males, M.D., [facility].C.P Pulmonary and Critical Care Medicine Staff Physician [place] [facility] Pager: [phone], If no answer or between  15:00h - 7:00h: call [phone]  11/25/2014 12:35 PM

## 2014-11-25 NOTE — Progress Notes (Signed)
eLink Physician-Brief Progress Note Patient Name: Laura May DOB: 1931/10/17 MRN: [id number]   Date of Service  11/25/2014  HPI/Events of Note  On propofol while on vent but continued increased RR and camera check shows the patient to be uncomfortable on vent.    eICU Interventions  Plan: PRN fentanyl 25 to 75 mcg q2Hr PRN pain     Intervention Category Intermediate Interventions: Pain - evaluation and management  [name] 11/25/2014, 11:24 PM

## 2014-11-25 NOTE — Progress Notes (Addendum)
PULMONARY / CRITICAL CARE MEDICINE HISTORY AND PHYSICAL EXAMINATION   Name: Laura May MRN: [id number] DOB: [birth year]    ADMISSION DATE:  11/17/2014  PRIMARY SERVICE: PCCM  CHIEF COMPLAINT:  S/p cardiac arrest  BRIEF PATIENT DESCRIPTION: Ms. Thien is a 81 y/o woman with  presented to the ED with cardiac arrest hours following discharge. The patient was admitted two days ago for dyspnea and chest discomfort which was attributed to asthma exacerbation. She was treated and discharge on 6/17, around 16:00. Her family states that on arrival home she developed some dyspnea and chest discomfort, and then suddenly started gasping for breath. EMS was called, and 911 dispatcher directed the family to perform a basic assessment, and the patient was found to be apneic and pulseless. She was without adequate resusitatitive measures for about 15 minutes until EMS arrived. She received ACLS for another 15 minutes and ROSC was achieved. In the ED, she seemed to be responding to noxious stimulous, and PCCM was called for admission with possible cooling.   LINES / TUBES: 6/18 7 mm ETT >> Art line 6/17 CVC R IJ 6/18 OGT 6/17 Foley 6/17  CULTURES: Urine 6/17 Blood 6/17 x2 >>  ANTIBIOTICS: Vanc 6/17-> Pip-tazo 6/17->    SIGNIFICANT EVENTS / STUDIES:  Out of hospital arrest Head CT 6/17 > no m/s/b 11/24/14: Rewarming , Poor UO   SUBJECTIVE/OVERNIGHT/INTERVAL HX 11/25/14: worsening creatinine.. Off sedation x 30h per RN -> unresponsive but cough +, gagp + , corneals +, partially opeened eyes to deep stimulation. Still on normothermia arm post cooling; anticipate to come off pads in 24h. Daughter at [facility]ide - does not want her kept artificially alive if no chance of neuro recovery   VITAL SIGNS: Temp:  [98.2 F (36.8 C)-99 F (37.2 C)] 98.2 F (36.8 C) (06/20 1000) Pulse Rate:  [80-111] 103 (06/20 1000) Resp:  [9-32] 30 (06/20 1000) BP: (111-205)/(51-89) 133/59 mmHg (06/20 0814) SpO2:  [96  %-100 %] 98 % (06/20 1000) Arterial Line BP: (100-159)/(51-71) 100/71 mmHg (06/20 1000) FiO2 (%):  [40 %] 40 % (06/20 1000) HEMODYNAMICS: CVP:  [6 mmHg-12 mmHg] 6 mmHg VENTILATOR SETTINGS: Vent Mode:  [-] PRVC FiO2 (%):  [40 %] 40 % Set Rate:  [20 bmp] 20 bmp Vt Set:  [300 mL] 300 mL PEEP:  [8 cmH20] 8 cmH20 Plateau Pressure:  [20 cmH20-29 cmH20] 21 cmH20 INTAKE / OUTPUT: Intake/Output      06/19 0701 - 06/20 0700 06/20 0701 - 06/21 0700   I.V. (mL/kg) 569 (10.4) 30 (0.5)   IV Piggyback     Total Intake(mL/kg) 569 (10.4) 30 (0.5)   Urine (mL/kg/hr) 204 (0.2) 325 (1.3)   Emesis/NG output 100 (0.1)    Total Output 304 325   Net +265 -295          PHYSICAL EXAMINATION: General:  Elderly woman intubated and sedated Neuro:  unresponsive but cough +, gagp + , corneals +, partially opeened eyes to deep stimulation. Did not see sponatenous motor movement HEENT:  MMM Neck: Trachea is midline Cardiovascular:  RRR, No M/R/G Lungs:  CTAB Abdomen:  Soft Musculoskeletal: good pulses Skin:  No rashes.  LABS:  PULMONARY  Recent Labs Lab 11/20/2014 2311  11/23/14 0048  11/23/14 0654 11/23/14 0830 11/23/14 1235 11/23/14 1554 11/23/14 1959  PHART 7.390  --  7.348*  --   --  7.415  --   --   --   PCO2ART 25.6*  --  30.1*  --   --  30.9*  --   --   --   PO2ART 76.0*  --  51.0*  --   --  78.0*  --   --   --   HCO3 15.7*  --  17.5*  --   --  20.7  --   --   --   TCO2 16  < > 19  < > $R'19 22 20 20 19  'aJ$ O2SAT 96.0  --  92.0  --   --  97.0  --   --   --   < > = values in this interval not displayed.  CBC  Recent Labs Lab 11/23/14 0028  11/23/14 0500  11/23/14 1554 11/23/14 1959 11/25/14 0355  HGB 8.8*  < > 8.6*  < > 11.2* 11.2* 8.9*  HCT 27.7*  < > 27.6*  < > 33.0* 33.0* 27.8*  WBC 13.7*  --  16.2*  --   --   --  12.3*  PLT 274  --  276  --   --   --  249  < > = values in this interval not displayed.  COAGULATION  Recent Labs Lab 11/23/14 0028 11/23/14 0750  INR 1.06  1.05    CARDIAC   Recent Labs Lab 11/19/2014 1930 11/23/14 0028 11/23/14 0500 11/23/14 1000 11/23/14 2030  TROPONINI 0.07* 0.08* 0.08* 0.08* 0.07*   No results for input(s): PROBNP in the last 168 hours.   CHEMISTRY  Recent Labs Lab 11/23/14 2350 11/24/14 0400 11/24/14 0815 11/24/14 1230 11/25/14 0355  NA 137 141 138 138 138  K 3.8 3.7 3.6 4.0 3.7  CL 109 112* 110 109 109  CO2 20* $Remov'23 22 22 'KfunDy$ 21*  GLUCOSE 113* 109* 125* 112* 84  BUN $Re'19 16 17 16 19  'NJF$ CREATININE 1.09* 1.14* 1.09* 1.53* 1.78*  CALCIUM 7.8* 8.2* 7.8* 8.0* 8.1*   Estimated Creatinine Clearance: 15.5 mL/min (by C-G formula based on Cr of 1.78).   LIVER  Recent Labs Lab 11/30/2014 1930 11/23/14 0028 11/23/14 0750  AST 55*  --   --   ALT 39  --   --   ALKPHOS 64  --   --   BILITOT 0.6  --   --   PROT 6.2*  --   --   ALBUMIN 3.0*  --   --   INR  --  1.06 1.05     INFECTIOUS  Recent Labs Lab 11/23/14 0255 11/23/14 0635 11/23/14 0811  LATICACIDVEN 2.0 2.5* 1.45     ENDOCRINE CBG (last 3)   Recent Labs  11/25/14 0005 11/25/14 0358 11/25/14 0733  GLUCAP 96 80 96         IMAGING x48h Dg Chest Port 1 View  11/25/2014   CLINICAL DATA:  Acute respiratory failure.  EXAM: PORTABLE CHEST - 1 VIEW  COMPARISON:  11/23/2014.  FINDINGS: Endotracheal tube, NG tube, right IJ line in stable position. Heart size stable. Right mid lung and bibasilar subsegmental atelectasis and/or infiltrates again noted. No pleural effusion or pneumothorax. Heart size normal. No acute bony abnormality.  IMPRESSION: 1. Lines and tubes in stable position.  2. Persistent right mid lung and bibasilar subsegmental atelectasis and/or infiltrates. No significant interim change.   Electronically Signed   By: Marcello Moores  Register   On: 11/25/2014 07:13       ASSESSMENT / PLAN:  Active Problems:   Cardiac arrest   Altered mental status   PULMONARY A: Acute hypoxic resp failure Possible aspiration CT angio chest 6/17  >>  no PE   - does not met sbt criteria  P:  Vent protocol No medical extubation   CARDIOVASCULAR A: S/p cardiac arrest ,unclear cause Less likely ACS Hypertensive on arrival - hypotensive later , cardiogenic shock  - s/p hypothermia. Now on normothermia arm. Off levophed  P:   Cardiology following.  MAP goal > 65  RENAL A: AKI - worsening Severe gap metabolic acidosis due to lactic acidosis, now improved   P:   Replete lytes as needed Monitor bmet  GASTROINTESTINAL A: No active issues P:   Consider TFs depending on goals of care  HEMATOLOGIC A: No active issues P:   Monitor Heparin SQ  INFECTIOUS A: Aspiration P:  hold off abx  ENDOCRINE A: No active issues P:  CBG while npo  NEUROLOGIC A: Anoxic encephalopathy with coma. EEG 6/18 diffuse cerebral dysfunction  - having some basic neuro function like corneal, gag and cough off sedation x 30h. GCS < 6;  P:   More precise neuro eval 11/26/14 and 11/25/2014 for precise neuro prognosis when off sedation completely Check neuron specifc enolase     Family meeting note: 6/ 17 A long discussion was had with the patient's family, including daughter (de-factor POA). Her prognosis is likely poor, but in the acute setting while cooled and on sedatitives, the certainty of that assessment is low. Discussed that patient's prior code status was DNR and how that informs our decisions going forward. The consensus was that the patient was "tired" of her situation and was concerned about low quality of life. However, family members did not believe that the patient would be opposed to temporary invasive measures if they offered the hope of a retained quality of life.- if we did ascertain significant neurologic injury, the decision most consistent with her stated wishes would be terminal extubation. The family noted agreement.   11/25/14: PCCM MD d/w daughter - DNAR now. Termina wean in hospital if not recovered over next 1  to few days  TODAY'S SUMMARY: 81 y/o woman recently discharged developed out-of-hospital cardiac arrest       The patient is critically ill with multiple organ systems failure and requires high complexity decision making for assessment and support, frequent evaluation and titration of therapies, application of advanced monitoring technologies and extensive interpretation of multiple databases.   Critical Care Time devoted to patient care services described in this note is  30  Minutes. This time reflects time of care of this signee Dr Brand Males. This critical care time does not reflect procedure time, or teaching time or supervisory time of PA/NP/Med student/Med Resident etc but could involve care discussion time    Dr. Brand Males, M.D., [facility].C.P Pulmonary and Critical Care Medicine Staff Physician [place] [facility] Pager: [phone], If no answer or between  15:00h - 7:00h: call [phone]  11/25/2014 11:55 AM

## 2014-11-25 NOTE — Progress Notes (Signed)
At bedside rounds, noted pt was incontinent of urine.  Unable to flush foley catheter.  Catheter removed and replaced with 14 fr foley.  Immediate return of urine. Will continue to monitor pt closely.

## 2014-11-25 NOTE — Progress Notes (Signed)
Nutrition Follow-up  DOCUMENTATION CODES:  Obesity unspecified  INTERVENTION:   (If pt remains on ventilator recommend initiating enteral nutrition support)  Recommend initiate Vital High Protein @ 20 ml/hr via OGT and increase by 10 ml every 4 hours to goal rate of 40 ml/hr.   Tube feeding regimen provides 960 kcal, 84 grams of protein, and 803 ml of H2O.   TF regimen and propofol at current rate providing 1089 total kcal/day (101 % of kcal needs)    NUTRITION DIAGNOSIS:  Inadequate oral intake related to inability to eat as evidenced by NPO status.  ongoing  GOAL:  Provide needs based on ASPEN/SCCM guidelines  Not met.   MONITOR:  Vent status, Labs, Weight trends, I & O's, Skin  REASON FOR ASSESSMENT:  Ventilator    ASSESSMENT:  Pt admitted after recent d/c with cardiac arrest.  Patient is currently intubated on ventilator support MV: 8.6 L/min Temp (24hrs), Avg:98.5 F (36.9 C), Min:98.2 F (36.8 C), Max:99 F (37.2 C)  Propofol: 4.9 ml/hr provides 129 kcal/day from lipid  Pt discussed during ICU rounds and with RN.  Pt rewarmed at 11:30 on 6/19.  Labs and medications reviewed.  Family meeting with MD today, pt now DNR and plans for terminal wean if pt does not recover in next few days.    Height:  Ht Readings from Last 1 Encounters:  11/23/14 $RemoveB'4\' 5"'BTovBRDQ$  (1.346 m)    Weight:  Wt Readings from Last 1 Encounters:  11/23/14 120 lb 13 oz (54.8 kg)    Ideal Body Weight:  40 kg  Wt Readings from Last 10 Encounters:  11/23/14 120 lb 13 oz (54.8 kg)  11/20/14 111 lb 8 oz (50.576 kg)  10/30/14 111 lb (50.349 kg)  07/13/14 101 lb (45.813 kg)  06/25/14 101 lb 4 oz (45.927 kg)  05/27/14 104 lb (47.174 kg)  05/24/14 112 lb (50.803 kg)  10/14/13 103 lb 6.4 oz (46.902 kg)  09/29/13 103 lb (46.72 kg)  02/14/08 105 lb (47.628 kg)    BMI:  Body mass index is 30.25 kg/(m^2).  Estimated Nutritional Needs:  Kcal:  1066.8  Protein:  > 80  grams  Fluid:  > 1.2 L  Skin:  Reviewed, no issues  Diet Order:    NPO  EDUCATION NEEDS:  No education needs identified at this time   Intake/Output Summary (Last 24 hours) at 11/25/14 1520 Last data filed at 11/25/14 1400  Gross per 24 hour  Intake 326.11 ml  Output    989 ml  Net -662.89 ml    Last BM:  PTA   Maylon Peppers RD, [place], [place] Pager [phone] After Hours Pager

## 2014-11-25 NOTE — Progress Notes (Signed)
Dr. Chase Caller notified of hypertension and tachypnea post turning patient.  Orders received.  Will continue to monitor pt closely.

## 2014-11-25 NOTE — Progress Notes (Signed)
Spoke with MD Byrum to confirm patient's MAP goal while maintaining patient at 37 degrees.  MD Byrum advised to maintain a MAP of 65 or greater.  Informed MD Byrum of patient's decreased UOP, MD reviewed patient's chart and advised no new orders at this time. Vicie Mutters, RN

## 2014-11-26 ENCOUNTER — Inpatient Hospital Stay (HOSPITAL_COMMUNITY): Payer: Medicare Other

## 2014-11-26 LAB — GLUCOSE, CAPILLARY
GLUCOSE-CAPILLARY: 85 mg/dL (ref 65–99)
GLUCOSE-CAPILLARY: 82 mg/dL (ref 65–99)
Glucose-Capillary: 72 mg/dL (ref 65–99)
Glucose-Capillary: 76 mg/dL (ref 65–99)
Glucose-Capillary: 78 mg/dL (ref 65–99)
Glucose-Capillary: 78 mg/dL (ref 65–99)
Glucose-Capillary: 83 mg/dL (ref 65–99)

## 2014-11-26 LAB — CBC WITH DIFFERENTIAL/PLATELET
BASOS ABS: 0 10*3/uL (ref 0.0–0.1)
Basophils Relative: 0 % (ref 0–1)
EOS PCT: 1 % (ref 0–5)
Eosinophils Absolute: 0.1 10*3/uL (ref 0.0–0.7)
HCT: 26.3 % — ABNORMAL LOW (ref 36.0–46.0)
HEMOGLOBIN: 8.3 g/dL — AB (ref 12.0–15.0)
LYMPHS ABS: 1.6 10*3/uL (ref 0.7–4.0)
LYMPHS PCT: 13 % (ref 12–46)
MCH: 22.2 pg — AB (ref 26.0–34.0)
MCHC: 31.6 g/dL (ref 30.0–36.0)
MCV: 70.3 fL — ABNORMAL LOW (ref 78.0–100.0)
MONOS PCT: 8 % (ref 3–12)
Monocytes Absolute: 1 10*3/uL (ref 0.1–1.0)
NEUTROS ABS: 9.3 10*3/uL — AB (ref 1.7–7.7)
Neutrophils Relative %: 78 % — ABNORMAL HIGH (ref 43–77)
Platelets: 271 10*3/uL (ref 150–400)
RBC: 3.74 MIL/uL — AB (ref 3.87–5.11)
RDW: 14.9 % (ref 11.5–15.5)
WBC: 12 10*3/uL — ABNORMAL HIGH (ref 4.0–10.5)

## 2014-11-26 LAB — URINE CULTURE: Culture: NO GROWTH

## 2014-11-26 LAB — BASIC METABOLIC PANEL
Anion gap: 8 (ref 5–15)
BUN: 22 mg/dL — ABNORMAL HIGH (ref 6–20)
CALCIUM: 8.5 mg/dL — AB (ref 8.9–10.3)
CO2: 22 mmol/L (ref 22–32)
Chloride: 112 mmol/L — ABNORMAL HIGH (ref 101–111)
Creatinine, Ser: 1.53 mg/dL — ABNORMAL HIGH (ref 0.44–1.00)
GFR, EST AFRICAN AMERICAN: 36 mL/min — AB (ref >60)
GFR, EST NON AFRICAN AMERICAN: 31 mL/min — AB (ref >60)
GLUCOSE: 87 mg/dL (ref 65–99)
POTASSIUM: 3.4 mmol/L — AB (ref 3.5–5.1)
Sodium: 142 mmol/L (ref 135–145)

## 2014-11-26 LAB — PHOSPHORUS: Phosphorus: 4.1 mg/dL (ref 2.5–4.6)

## 2014-11-26 LAB — MAGNESIUM: Magnesium: 1.6 mg/dL — ABNORMAL LOW (ref 1.7–2.4)

## 2014-11-26 MED ORDER — POTASSIUM CHLORIDE 20 MEQ/15ML (10%) PO SOLN
40.0000 meq | Freq: Once | ORAL | Status: AC
  Administered 2014-11-26: 40 meq
  Filled 2014-11-26 (×2): qty 30

## 2014-11-26 MED ORDER — MAGNESIUM SULFATE 2 GM/50ML IV SOLN
2.0000 g | Freq: Once | INTRAVENOUS | Status: AC
  Administered 2014-11-26: 2 g via INTRAVENOUS
  Filled 2014-11-26: qty 50

## 2014-11-26 NOTE — Progress Notes (Signed)
PULMONARY / CRITICAL CARE MEDICINE   Name: Laura May MRN: [id number] DOB: 1932/01/05    ADMISSION DATE:  11/06/2014  PRIMARY SERVICE: PCCM  CHIEF COMPLAINT:  S/p cardiac arrest  BRIEF PATIENT DESCRIPTION: Laura May is a 81 y/o woman with  presented to the ED with cardiac arrest hours following discharge. The patient was admitted two days ago for dyspnea and chest discomfort which was attributed to asthma exacerbation. She was treated and discharge on 6/17, around 16:00. Her family states that on arrival home she developed some dyspnea and chest discomfort, and then suddenly started gasping for breath. EMS was called, and 911 dispatcher directed the family to perform a basic assessment, and the patient was found to be apneic and pulseless. She was without adequate resusitatitive measures for about 15 minutes until EMS arrived. She received ACLS for another 15 minutes and ROSC was achieved. In the ED, she seemed to be responding to noxious stimulous, and PCCM was called for admission with possible cooling.   LINES / TUBES: 6/18 7 mm ETT >> Art line 6/17 CVC R IJ 6/18 OGT 6/17 Foley 6/17  CULTURES: Urine 6/17 >> negative Blood 6/17 x2 >>  ANTIBIOTICS: Vanc 6/17-> Pip-tazo 6/17->  SIGNIFICANT EVENTS / STUDIES:  Out of hospital arrest Head CT 6/17 > no acute abnormality 11/24/14: Rewarming , Poor UO   SUBJECTIVE/OVERNIGHT/INTERVAL HX 11/25/14: worsening creatinine.. Off sedation x 30h per RN -> unresponsive but cough +, gagp + , corneals +, partially opeened eyes to deep stimulation. Still on normothermia arm post cooling; anticipate to come off pads in 24h. Daughter at bdside - does not want her kept artificially alive if no chance of neuro recovery  VITAL SIGNS: Temp:  [98.2 F (36.8 C)-99 F (37.2 C)] 99 F (37.2 C) (06/21 0800) Pulse Rate:  [86-200] 86 (06/21 0913) Resp:  [19-49] 21 (06/21 0913) BP: (91-167)/(39-90) 91/39 mmHg (06/21 0913) SpO2:  [95 %-100 %] 99 %  (06/21 0913) Arterial Line BP: (98-255)/(49-106) 166/61 mmHg (06/20 2200) FiO2 (%):  [40 %] 40 % (06/21 0913) HEMODYNAMICS: CVP:  [6 mmHg-20 mmHg] 20 mmHg VENTILATOR SETTINGS: Vent Mode:  [-] PRVC FiO2 (%):  [40 %] 40 % Set Rate:  [20 bmp] 20 bmp Vt Set:  [300 mL] 300 mL PEEP:  [5 cmH20] 5 cmH20 Plateau Pressure:  [16 cmH20-24 cmH20] 16 cmH20 INTAKE / OUTPUT: Intake/Output      06/20 0701 - 06/21 0700 06/21 0701 - 06/22 0700   I.V. (mL/kg) 537.6 (9.8) 43 (0.8)   NG/GT 60    IV Piggyback 50    Total Intake(mL/kg) 647.6 (11.8) 43 (0.8)   Urine (mL/kg/hr) 1220 (0.9)    Emesis/NG output 450 (0.3)    Total Output 1670     Net -1022.4 +43        Urine Occurrence 1 x      PHYSICAL EXAMINATION: General:  Elderly woman intubated and sedated Neuro:  unresponsive but cough +, gagp + , corneals +, partially opeened eyes to deep stimulation. Did not see sponatenous motor movement HEENT:  MMM Neck: Trachea is midline Cardiovascular:  RRR, No M/R/G Lungs:  CTAB Abdomen:  Soft Musculoskeletal: good pulses Skin:  No rashes.  LABS:  PULMONARY  Recent Labs Lab 11/13/2014 2311  11/23/14 0048  11/23/14 0654 11/23/14 0830 11/23/14 1235 11/23/14 1554 11/23/14 1959  PHART 7.390  --  7.348*  --   --  7.415  --   --   --   PCO2ART 25.6*  --  30.1*  --   --  30.9*  --   --   --   PO2ART 76.0*  --  51.0*  --   --  78.0*  --   --   --   HCO3 15.7*  --  17.5*  --   --  20.7  --   --   --   TCO2 16  < > 19  < > 19 22 20 20 19   O2SAT 96.0  --  92.0  --   --  97.0  --   --   --   < > = values in this interval not displayed.  CBC  Recent Labs Lab 11/23/14 0500  11/23/14 1959 11/25/14 0355 11/26/14 0305  HGB 8.6*  < > 11.2* 8.9* 8.3*  HCT 27.6*  < > 33.0* 27.8* 26.3*  WBC 16.2*  --   --  12.3* 12.0*  PLT 276  --   --  249 271  < > = values in this interval not displayed.  COAGULATION  Recent Labs Lab 11/23/14 0028 11/23/14 0750  INR 1.06 1.05    CARDIAC    Recent  Labs Lab 12/01/2014 1930 11/23/14 0028 11/23/14 0500 11/23/14 1000 11/23/14 2030  TROPONINI 0.07* 0.08* 0.08* 0.08* 0.07*   No results for input(s): PROBNP in the last 168 hours.   CHEMISTRY  Recent Labs Lab 11/24/14 0400 11/24/14 0815 11/24/14 1230 11/25/14 0355 11/26/14 0305  NA 141 138 138 138 142  K 3.7 3.6 4.0 3.7 3.4*  CL 112* 110 109 109 112*  CO2 23 22 22  21* 22  GLUCOSE 109* 125* 112* 84 87  BUN 16 17 16 19  22*  CREATININE 1.14* 1.09* 1.53* 1.78* 1.53*  CALCIUM 8.2* 7.8* 8.0* 8.1* 8.5*  MG  --   --   --   --  1.6*  PHOS  --   --   --   --  4.1   Estimated Creatinine Clearance: 18 mL/min (by C-G formula based on Cr of 1.53).   LIVER  Recent Labs Lab 11/25/2014 1930 11/23/14 0028 11/23/14 0750  AST 55*  --   --   ALT 39  --   --   ALKPHOS 64  --   --   BILITOT 0.6  --   --   PROT 6.2*  --   --   ALBUMIN 3.0*  --   --   INR  --  1.06 1.05     INFECTIOUS  Recent Labs Lab 11/23/14 0255 11/23/14 0635 11/23/14 0811  LATICACIDVEN 2.0 2.5* 1.45     ENDOCRINE CBG (last 3)   Recent Labs  11/26/14 0008 11/26/14 0308 11/26/14 0736  GLUCAP 76 78 83      IMAGING x48h Dg Chest Port 1 View  11/26/2014   CLINICAL DATA:  Hypoxia  EXAM: PORTABLE CHEST - 1 VIEW  COMPARISON:  November 25, 2014  FINDINGS: Endotracheal tube tip is 2.3 cm above the carina. Nasogastric tube tip and side port are below the diaphragm. Central catheter tip is at the cavoatrial junction. No pneumothorax. There is airspace consolidation in the left lower lobe. There is a small left pleural effusion, stable. Lungs elsewhere clear. Heart is upper normal in size with pulmonary vascularity within normal limits.  IMPRESSION: Tube and catheter positions as described without appreciable pneumothorax. Left lower lobe consolidation persists and may be slightly increased compared to 1 day prior. There is a stable small left pleural effusion.   Electronically Signed  By: Lowella Grip III  M.D.   On: 11/26/2014 07:21   Dg Chest Port 1 View  11/25/2014   CLINICAL DATA:  Endotracheal tube placement  EXAM: PORTABLE CHEST - 1 VIEW  COMPARISON:  11/25/2014 at 521 hours  FINDINGS: 1345 hours. Endotracheal tube terminates 4.7 cm above carina. Nasogastric extends beyond the inferior aspect of the film. Right internal jugular line with tip at low SVC. Overlying external pacer/ defibrillator. Cardiomegaly accentuated by AP portable technique. Atherosclerosis in the transverse aorta. Probable small left pleural effusion. No pneumothorax. Mild pulmonary venous congestion. Slight worsening left base atelectasis.  IMPRESSION: Appropriate position of endotracheal tube, 4.7 cm above carina.  Cardiomegaly with mild pulmonary venous congestion.  Worsened left base atelectasis with persistent small left pleural effusion.   Electronically Signed   By: Abigail Miyamoto M.D.   On: 11/25/2014 14:02   Dg Chest Port 1 View  11/25/2014   CLINICAL DATA:  Acute respiratory failure.  EXAM: PORTABLE CHEST - 1 VIEW  COMPARISON:  11/23/2014.  FINDINGS: Endotracheal tube, NG tube, right IJ line in stable position. Heart size stable. Right mid lung and bibasilar subsegmental atelectasis and/or infiltrates again noted. No pleural effusion or pneumothorax. Heart size normal. No acute bony abnormality.  IMPRESSION: 1. Lines and tubes in stable position.  2. Persistent right mid lung and bibasilar subsegmental atelectasis and/or infiltrates. No significant interim change.   Electronically Signed   By: Marcello Moores  Register   On: 11/25/2014 07:13     ASSESSMENT / PLAN:  Active Problems:   Cardiac arrest   Altered mental status   Acute respiratory failure with hypoxia   Coma   DNAR (do not attempt resuscitation)   PULMONARY A: Acute hypoxic resp failure Possible aspiration CT angio chest 6/17 >> no PE  P:   Continue MV until we can determine neurological status   CARDIOVASCULAR A: S/p cardiac arrest, Consider primary  respiratory arrest as serial trop are low positive Hypertensive on arrival - hypotensive later , cardiogenic shock - s/p hypothermia. Now on normothermia arm. Off levophed  P:   Cardiology following.  MAP goal > 65   RENAL A: AKI, stabilizing 6/21 Severe gap metabolic acidosis due to lactic acidosis, now improved  P:   Replete lytes as needed Monitor bmet  GASTROINTESTINAL A: No active issues P:   Consider TFs depending on goals of care, discussion w family  HEMATOLOGIC A: No active issues P:   Monitor Heparin SQ  INFECTIOUS A: Suspected LLL PNA / aspiration P:   Following off abx at this time  ENDOCRINE A: No active issues P:  CBG while npo  NEUROLOGIC A: Anoxic encephalopathy with coma. EEG 6/18 diffuse cerebral dysfunction - having some basic neuro function like corneal, gag and cough off sedation x 30h. GCS < 6;  P:   Propofol restarted due to tachycardia, tachypnea. No evidence for meaningful neuro fxn while off sedation. Suspect severe hypoxic injury. This will guide our decisions regarding possible withdrawal of care.     Family meeting note: 6/ 17 A long discussion was had with the patient's family, including daughter (de-factor [place]). Her prognosis is likely poor, but in the acute setting while cooled and on sedatitives, the certainty of that assessment is low. Discussed that patient's prior code status was DNR and how that informs our decisions going forward. The consensus was that the patient was "tired" of her situation and was concerned about low quality of life. However, family members did not believe  that the patient would be opposed to temporary invasive measures if they offered the hope of a retained quality of life.- if we did ascertain significant neurologic injury, the decision most consistent with her stated wishes would be terminal extubation. The family noted agreement.  11/25/14: PCCM MD d/w daughter - DNAR now. Terminal wean in hospital if  not recovered over next 1 to few days  TODAY'S SUMMARY: 81 y/o woman recently discharged developed out-of-hospital cardiac arrest   Independent CC time 35 minutes  Baltazar Apo, MD, PhD 11/26/2014, 10:42 AM [facility] [phone] or if no answer [phone]

## 2014-11-26 NOTE — Progress Notes (Signed)
eLink Physician-Brief Progress Note Patient Name: Laura May DOB: 1932/04/20 MRN: [id number]   Date of Service  11/26/2014  HPI/Events of Note  Hypokalemia and hypomag  eICU Interventions  Potassium and mag replaced     Intervention Category Intermediate Interventions: Electrolyte abnormality - evaluation and management  DETERDING,ELIZABETH 11/26/2014, 4:26 AM

## 2014-11-27 LAB — GLUCOSE, CAPILLARY
GLUCOSE-CAPILLARY: 74 mg/dL (ref 65–99)
GLUCOSE-CAPILLARY: 123 mg/dL — AB (ref 65–99)
GLUCOSE-CAPILLARY: 62 mg/dL — AB (ref 65–99)
Glucose-Capillary: 72 mg/dL (ref 65–99)
Glucose-Capillary: 62 mg/dL — ABNORMAL LOW (ref 65–99)
Glucose-Capillary: 66 mg/dL (ref 65–99)
Glucose-Capillary: 172 mg/dL — ABNORMAL HIGH (ref 65–99)

## 2014-11-27 LAB — BASIC METABOLIC PANEL
ANION GAP: 10 (ref 5–15)
BUN: 20 mg/dL (ref 6–20)
CO2: 22 mmol/L (ref 22–32)
Calcium: 8.9 mg/dL (ref 8.9–10.3)
Chloride: 110 mmol/L (ref 101–111)
Creatinine, Ser: 1.29 mg/dL — ABNORMAL HIGH (ref 0.44–1.00)
GFR calc Af Amer: 44 mL/min — ABNORMAL LOW (ref >60)
GFR, EST NON AFRICAN AMERICAN: 38 mL/min — AB (ref >60)
GLUCOSE: 79 mg/dL (ref 65–99)
Potassium: 3.4 mmol/L — ABNORMAL LOW (ref 3.5–5.1)
Sodium: 142 mmol/L (ref 135–145)

## 2014-11-27 LAB — CULTURE, BLOOD (ROUTINE X 2)
CULTURE: NO GROWTH
Culture: NO GROWTH

## 2014-11-27 MED ORDER — DEXTROSE 50 % IV SOLN
1.0000 | Freq: Once | INTRAVENOUS | Status: AC
  Administered 2014-11-27: 50 mL via INTRAVENOUS

## 2014-11-27 MED ORDER — DEXTROSE 50 % IV SOLN
INTRAVENOUS | Status: AC
  Filled 2014-11-27: qty 50

## 2014-11-27 MED ORDER — DEXTROSE 50 % IV SOLN
25.0000 mL | Freq: Once | INTRAVENOUS | Status: AC
  Administered 2014-11-27: 25 mL via INTRAVENOUS

## 2014-11-27 MED ORDER — MORPHINE SULFATE 25 MG/ML IV SOLN
10.0000 mg/h | INTRAVENOUS | Status: DC
  Administered 2014-11-27: 10 mg/h via INTRAVENOUS
  Administered 2014-11-27: 35 mg/h via INTRAVENOUS
  Filled 2014-11-27 (×2): qty 10

## 2014-11-27 MED ORDER — MORPHINE BOLUS VIA INFUSION
5.0000 mg | INTRAVENOUS | Status: DC | PRN
  Administered 2014-11-27: 5 mg via INTRAVENOUS
  Administered 2014-11-27: 10 mg via INTRAVENOUS
  Administered 2014-11-27 (×2): 5 mg via INTRAVENOUS
  Filled 2014-11-27 (×5): qty 20

## 2014-11-27 MED ORDER — PROPOFOL 1000 MG/100ML IV EMUL: 5.0000 ug/kg/min | INTRAVENOUS | Status: DC

## 2014-11-27 NOTE — Progress Notes (Signed)
Hypoglycemic Event  CBG: 62, 66  Treatment: D50 IV 25 mL  Symptoms: None  Follow-up CBG:0838 Time:CBG Result:123  Possible Reasons for Event: Inadequate meal intake  Comments/MD notified: Byrum at bedside    Mcarthur Rossetti  Remember to initiate Hypoglycemia Order Set & complete

## 2014-11-27 NOTE — Progress Notes (Signed)
Morphine 232ml in intravenous bag wasted in sink.

## 2014-11-27 NOTE — Progress Notes (Signed)
eLink Physician-Brief Progress Note Patient Name: Laura May DOB: 16-Nov-1931 MRN: [id number]   Date of Service  11/18/2014  HPI/Events of Note  Hypothermia in patient who is currently a limited code.  eICU Interventions  Plan: Apply warming blanket for patient comfort     Intervention Category Intermediate Interventions: Other:  Aixa Corsello , 5:51 AM

## 2014-11-27 NOTE — Procedures (Signed)
Extubation Procedure Note  Patient Details:   Name: Laura May DOB: [birth year] MRN: [id number]   Patient was a terminal extubation. No 02 put on patient.   Evaluation  O2 sats: stable throughout Complications: No apparent complications Patient did tolerate procedure well. Bilateral Breath Sounds: Clear, Diminished Suctioning: Airway   Kelle Darting 11/19/2014, 5:00 PM

## 2014-11-27 NOTE — Progress Notes (Signed)
PULMONARY / CRITICAL CARE MEDICINE   Name: Laura May MRN: [id number] DOB: 1932-03-17    ADMISSION DATE:  11/09/2014  PRIMARY SERVICE: PCCM  CHIEF COMPLAINT:  S/p cardiac arrest  BRIEF PATIENT DESCRIPTION: Laura May is a 81 y/o woman with  presented to the ED with cardiac arrest hours following discharge. The patient was admitted two days ago for dyspnea and chest discomfort which was attributed to asthma exacerbation. She was treated and discharge on 6/17, around 16:00. Her family states that on arrival home she developed some dyspnea and chest discomfort, and then suddenly started gasping for breath. EMS was called, and 911 dispatcher directed the family to perform a basic assessment, and the patient was found to be apneic and pulseless. She was without adequate resusitatitive measures for about 15 minutes until EMS arrived. She received ACLS for another 15 minutes and ROSC was achieved. In the ED, she seemed to be responding to noxious stimulous, and PCCM was called for admission with possible cooling.   LINES / TUBES: 6/18 7 mm ETT >> Art line 6/17 CVC R IJ 6/18 OGT 6/17 Foley 6/17  CULTURES: Urine 6/17 >> negative Blood 6/17 x2 >>  ANTIBIOTICS: Vanc 6/17-> Pip-tazo 6/17->  SIGNIFICANT EVENTS / STUDIES:  Out of hospital arrest Head CT 6/17 > no acute abnormality 11/24/14: Rewarming , Poor UO   SUBJECTIVE/OVERNIGHT/INTERVAL HX No significant neurological changes Tachycardia and tachypnea when sedation turned down but no purposeful movement  VITAL SIGNS: Temp:  [94.2 F (34.6 C)-99 F (37.2 C)] 95.7 F (35.4 C) (06/22 0800) Pulse Rate:  [60-121] 89 (06/22 1105) Resp:  [11-39] 27 (06/22 1105) BP: (104-217)/(44-126) 141/88 mmHg (06/22 1105) SpO2:  [96 %-100 %] 97 % (06/22 1105) FiO2 (%):  [30 %-40 %] 30 % (06/22 1100) HEMODYNAMICS: CVP:  [5 mmHg-8 mmHg] 5 mmHg VENTILATOR SETTINGS: Vent Mode:  [-] PRVC FiO2 (%):  [30 %-40 %] 30 % Set Rate:  [20 bmp] 20 bmp Vt  Set:  [300 mL] 300 mL PEEP:  [5 cmH20] 5 cmH20 Plateau Pressure:  [16 cmH20-22 cmH20] 17 cmH20 INTAKE / OUTPUT: Intake/Output      06/21 0701 - 06/22 0700 06/22 0701 - 06/23 0700   I.V. (mL/kg) 564.1 (10.3) 61 (1.1)   NG/GT 90    IV Piggyback     Total Intake(mL/kg) 654.1 (11.9) 61 (1.1)   Urine (mL/kg/hr) 900 (0.7) 175 (0.7)   Emesis/NG output     Total Output 900 175   Net -245.9 -114.1          PHYSICAL EXAMINATION: General:  Elderly woman intubated and sedated Neuro:  unresponsive but cough +, gagp + , corneals +, partially opeened eyes to deep stimulation. No sponatenous motor movement HEENT:  MMM Neck: Trachea is midline Cardiovascular:  RRR, No M/R/G Lungs:  CTAB Abdomen:  Soft Musculoskeletal: good pulses Skin:  No rashes.  LABS:  PULMONARY  Recent Labs Lab  2311  11/23/14 0048  11/23/14 0654 11/23/14 0830 11/23/14 1235 11/23/14 1554 11/23/14 1959  PHART 7.390  --  7.348*  --   --  7.415  --   --   --   PCO2ART 25.6*  --  30.1*  --   --  30.9*  --   --   --   PO2ART 76.0*  --  51.0*  --   --  78.0*  --   --   --   HCO3 15.7*  --  17.5*  --   --  20.7  --   --   --  TCO2 16  < > 19  < > 19 22 20 20 19   O2SAT 96.0  --  92.0  --   --  97.0  --   --   --   < > = values in this interval not displayed.  CBC  Recent Labs Lab 11/23/14 0500  11/23/14 1959 11/25/14 0355 11/26/14 0305  HGB 8.6*  < > 11.2* 8.9* 8.3*  HCT 27.6*  < > 33.0* 27.8* 26.3*  WBC 16.2*  --   --  12.3* 12.0*  PLT 276  --   --  249 271  < > = values in this interval not displayed.  COAGULATION  Recent Labs Lab 11/23/14 0028 11/23/14 0750  INR 1.06 1.05    CARDIAC    Recent Labs Lab 11/20/2014 1930 11/23/14 0028 11/23/14 0500 11/23/14 1000 11/23/14 2030  TROPONINI 0.07* 0.08* 0.08* 0.08* 0.07*   No results for input(s): PROBNP in the last 168 hours.   CHEMISTRY  Recent Labs Lab 11/24/14 0815 11/24/14 1230 11/25/14 0355 11/26/14 0305 11/30/2014 0440   NA 138 138 138 142 142  K 3.6 4.0 3.7 3.4* 3.4*  CL 110 109 109 112* 110  CO2 22 22 21* 22 22  GLUCOSE 125* 112* 84 87 79  BUN 17 16 19  22* 20  CREATININE 1.09* 1.53* 1.78* 1.53* 1.29*  CALCIUM 7.8* 8.0* 8.1* 8.5* 8.9  MG  --   --   --  1.6*  --   PHOS  --   --   --  4.1  --    Estimated Creatinine Clearance: 21.4 mL/min (by C-G formula based on Cr of 1.29).   LIVER  Recent Labs Lab 11/25/2014 1930 11/23/14 0028 11/23/14 0750  AST 55*  --   --   ALT 39  --   --   ALKPHOS 64  --   --   BILITOT 0.6  --   --   PROT 6.2*  --   --   ALBUMIN 3.0*  --   --   INR  --  1.06 1.05     INFECTIOUS  Recent Labs Lab 11/23/14 0255 11/23/14 0635 11/23/14 0811  LATICACIDVEN 2.0 2.5* 1.45     ENDOCRINE CBG (last 3)   Recent Labs  11/29/2014 0723 11/13/2014 0729 12/04/2014 0839  GLUCAP 62* 66 123*      IMAGING x48h Dg Chest Port 1 View  11/26/2014   CLINICAL DATA:  Hypoxia  EXAM: PORTABLE CHEST - 1 VIEW  COMPARISON:  November 25, 2014  FINDINGS: Endotracheal tube tip is 2.3 cm above the carina. Nasogastric tube tip and side port are below the diaphragm. Central catheter tip is at the cavoatrial junction. No pneumothorax. There is airspace consolidation in the left lower lobe. There is a small left pleural effusion, stable. Lungs elsewhere clear. Heart is upper normal in size with pulmonary vascularity within normal limits.  IMPRESSION: Tube and catheter positions as described without appreciable pneumothorax. Left lower lobe consolidation persists and may be slightly increased compared to 1 day prior. There is a stable small left pleural effusion.   Electronically Signed   By: Lowella Grip III M.D.   On: 11/26/2014 07:21   Dg Chest Port 1 View  11/25/2014   CLINICAL DATA:  Endotracheal tube placement  EXAM: PORTABLE CHEST - 1 VIEW  COMPARISON:  11/25/2014 at 521 hours  FINDINGS: 1345 hours. Endotracheal tube terminates 4.7 cm above carina. Nasogastric extends beyond the inferior  aspect of the  film. Right internal jugular line with tip at low SVC. Overlying external pacer/ defibrillator. Cardiomegaly accentuated by AP portable technique. Atherosclerosis in the transverse aorta. Probable small left pleural effusion. No pneumothorax. Mild pulmonary venous congestion. Slight worsening left base atelectasis.  IMPRESSION: Appropriate position of endotracheal tube, 4.7 cm above carina.  Cardiomegaly with mild pulmonary venous congestion.  Worsened left base atelectasis with persistent small left pleural effusion.   Electronically Signed   By: Abigail Miyamoto M.D.   On: 11/25/2014 14:02     ASSESSMENT / PLAN:  Active Problems:   Cardiac arrest   Altered mental status   Acute respiratory failure with hypoxia   Coma   DNAR (do not attempt resuscitation)   PULMONARY A: Acute hypoxic resp failure Possible aspiration CT angio chest 6/17 >> no PE  P:   Stable on MV but MS precludes any attempt to extubate   CARDIOVASCULAR A: S/p cardiac arrest, Consider primary respiratory arrest as serial trop are low positive Hypertensive on arrival - hypotensive later , cardiogenic shock - s/p hypothermia. Now on normothermia arm. Off levophed  P:   Cardiology following.  MAP goal > 65   RENAL A: AKI, improved Severe gap metabolic acidosis due to lactic acidosis, now improved  P:   Monitor bmet  GASTROINTESTINAL A: No active issues P:   Hold off on TF given possible withdrawal of care soon  HEMATOLOGIC A: No active issues P:   Monitor Heparin SQ  INFECTIOUS A: Suspected LLL PNA / aspiration P:   Following off abx at this time  ENDOCRINE A: No active issues P:  CBG while npo  NEUROLOGIC A: Anoxic encephalopathy with coma. EEG 6/18 diffuse cerebral dysfunction - having some basic neuro function like corneal, gag and cough off sedation. Unfortunately no significant change this am. I do not believe she can be extubated, and believe that overall prognosis  for meaningful recovery is poor.  P:   Reinitiate sedation to insure comfort    Family meeting note: 6/ 17 A long discussion was had with the patient's family, including daughter (de-factor [place]). Her prognosis is likely poor, but in the acute setting while cooled and on sedatitives, the certainty of that assessment is low. Discussed that patient's prior code status was DNR and how that informs our decisions going forward. The consensus was that the patient was "tired" of her situation and was concerned about low quality of life. However, family members did not believe that the patient would be opposed to temporary invasive measures if they offered the hope of a retained quality of life.- if we did ascertain significant neurologic injury, the decision most consistent with her stated wishes would be terminal extubation. The family noted agreement.  11/25/14: PCCM MD d/w daughter - DNAR now. Terminal wean in hospital if not recovered over next 1 to few days  12/04/2014 -- I spoke with the pt's daughter Vaughan Basta at bedside this am, explained that she has not clinically changed and that neurological fxn was limiting any progress. She confirmed that Ms Moat would not want to be supported with MV or other extraordinary means in absence of chance for meaningful recovery. She is speaking with other family members and has set a goal of withdrawal of care, possibly this pm. I will continue to discuss with her.    Independent CC time 35 minutes  Baltazar Apo, MD, PhD 11/08/2014, 11:51 AM [facility] [phone] or if no answer [phone]

## 2014-11-27 NOTE — Progress Notes (Signed)
Patient time of death 11/16/2014 at 21:31. 2 RNs (Raine Elsass and Windy Carina) auscultated for heart and breath sounds per protocol. Negative for pupil reflexes and pulses. Family notified, CCM notified, [place] [facility] notified.

## 2014-11-29 ENCOUNTER — Telehealth: Payer: Self-pay

## 2014-11-29 NOTE — Telephone Encounter (Signed)
On 11/29/2014 I received a death certificate from [place]. The death certificate is for cremation.  This patient is a patient of Doctor Byrum. The death certificate will be taken to [place] at [facility] for signature this afternoon. On 12-07-14 I received the death certificate back from Doctor Byrum. I got the death certificate ready for pick up and called the funeral home to let them know the death certificate was ready for pickup.

## 2014-11-30 LAB — NEURON-SPECIFIC ENOLASE(NSE), BLOOD

## 2014-12-02 DIAGNOSIS — R062 Wheezing: Secondary | ICD-10-CM | POA: Diagnosis not present

## 2014-12-02 DIAGNOSIS — J479 Bronchiectasis, uncomplicated: Secondary | ICD-10-CM | POA: Diagnosis not present

## 2014-12-06 DEATH — deceased

## 2014-12-19 NOTE — Discharge Summary (Signed)
PULMONARY / CRITICAL CARE MEDICINE   Name: Laura May MRN: [id number] DOB: [birth year]    ADMISSION DATE:  01-Dec-2014  DATE OF DEATH 12-07-2014  PRIMARY SERVICE: PCCM  CHIEF COMPLAINT:  S/p cardiac arrest  FINAL CAUSE OF DEATH Anoxic encephalopathy and coma  SECONDARY CAUSES OF DEATH Cardiac arrest due to respiratory arrest Possible aspiration PNA Cardiogenic shock Septic shock Acute renal failure Lactic acidosis Asthma Hx HTN Hx EtOH abuse    BRIEF PATIENT DESCRIPTION / HOSPITAL COURSE: Laura May is a 81 y/o woman with  presented to the ED with cardiac arrest hours following discharge. The patient was admitted two days admitted for dyspnea and chest discomfort which was attributed to asthma exacerbation. She was treated and discharged on 12/01/2022, around 16:00. Her family states that on arrival home she developed some dyspnea and chest discomfort, and then suddenly started gasping for breath. EMS was called, and 911 dispatcher directed the family to perform a basic assessment, and the patient was found to be apneic and pulseless. She was without adequate resusitatitive measures for about 15 minutes until EMS arrived. She received ACLS for another 15 minutes and ROSC was achieved. She was in shock, presumed cardiogenic vs septic and had a profound lactic acidosis. She was resuscitated and stabilized. Unfortunately her MS did not improve and she remained comatose. Based on her poor overall condition and her poor neurological prognosis decision was made to withdraw life support. This was done on 2014-12-07.    LINES / TUBES: 6/18 7 mm ETT >> Art line 01-Dec-2022 CVC R IJ 6/18 OGT 01-Dec-2022 Foley Dec 01, 2022  CULTURES: Urine 2022/12/01 >> negative Blood December 01, 2022 x2 >>  ANTIBIOTICS: Vanc 6/17-> Pip-tazo 6/17->  SIGNIFICANT EVENTS / STUDIES:  Out of hospital arrest Head CT 2022/12/01 > no acute abnormality 11/24/14: Rewarming , Poor UO   Baltazar Apo, MD, PhD 12/19/2014, 3:30 PM [facility] [phone] or if no answer [phone]

## 2014-12-25 ENCOUNTER — Ambulatory Visit: Payer: Medicare Other | Admitting: Family Medicine

## 2015-03-15 IMAGING — CR DG CHEST 2V
3 series · 3 of 3 positions shown · non-contrast
Comparison: 05/22/2014

CLINICAL DATA: Cough and congestion.

EXAM:
CHEST - 2 VIEW

[PA]
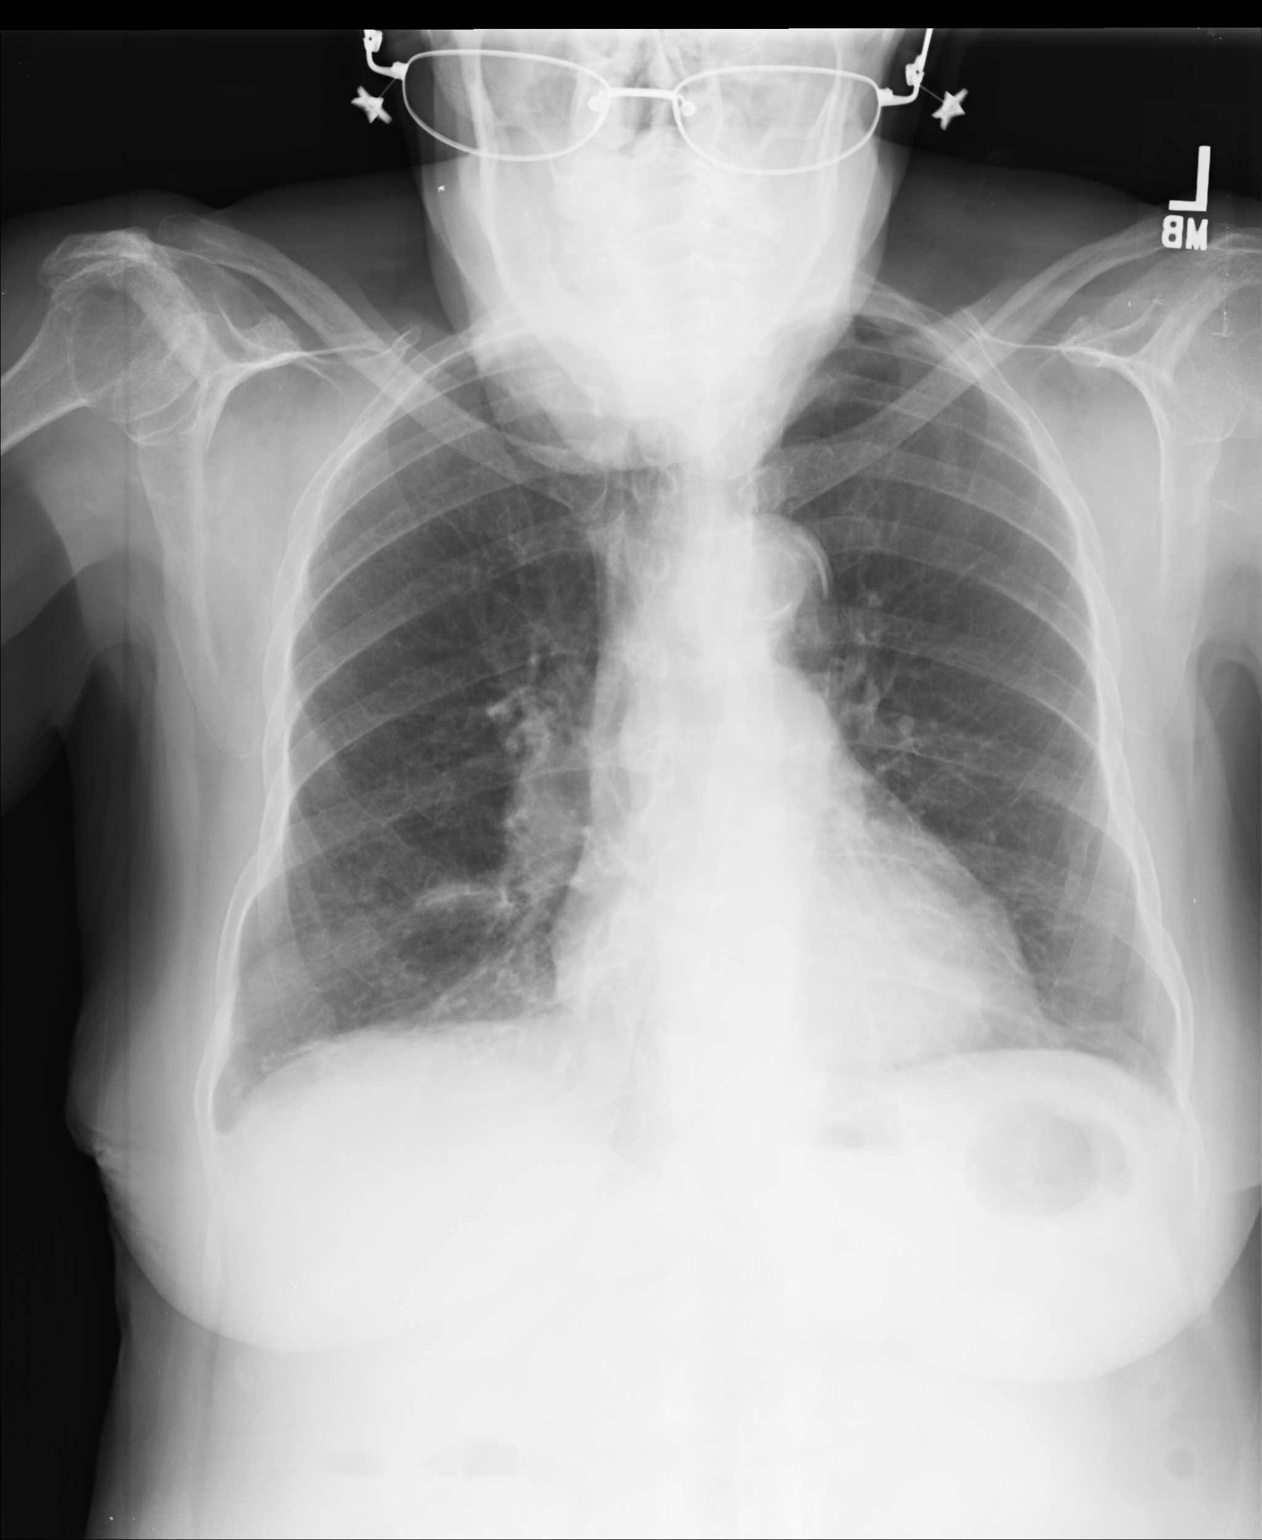

[lateral (1 of 2)]
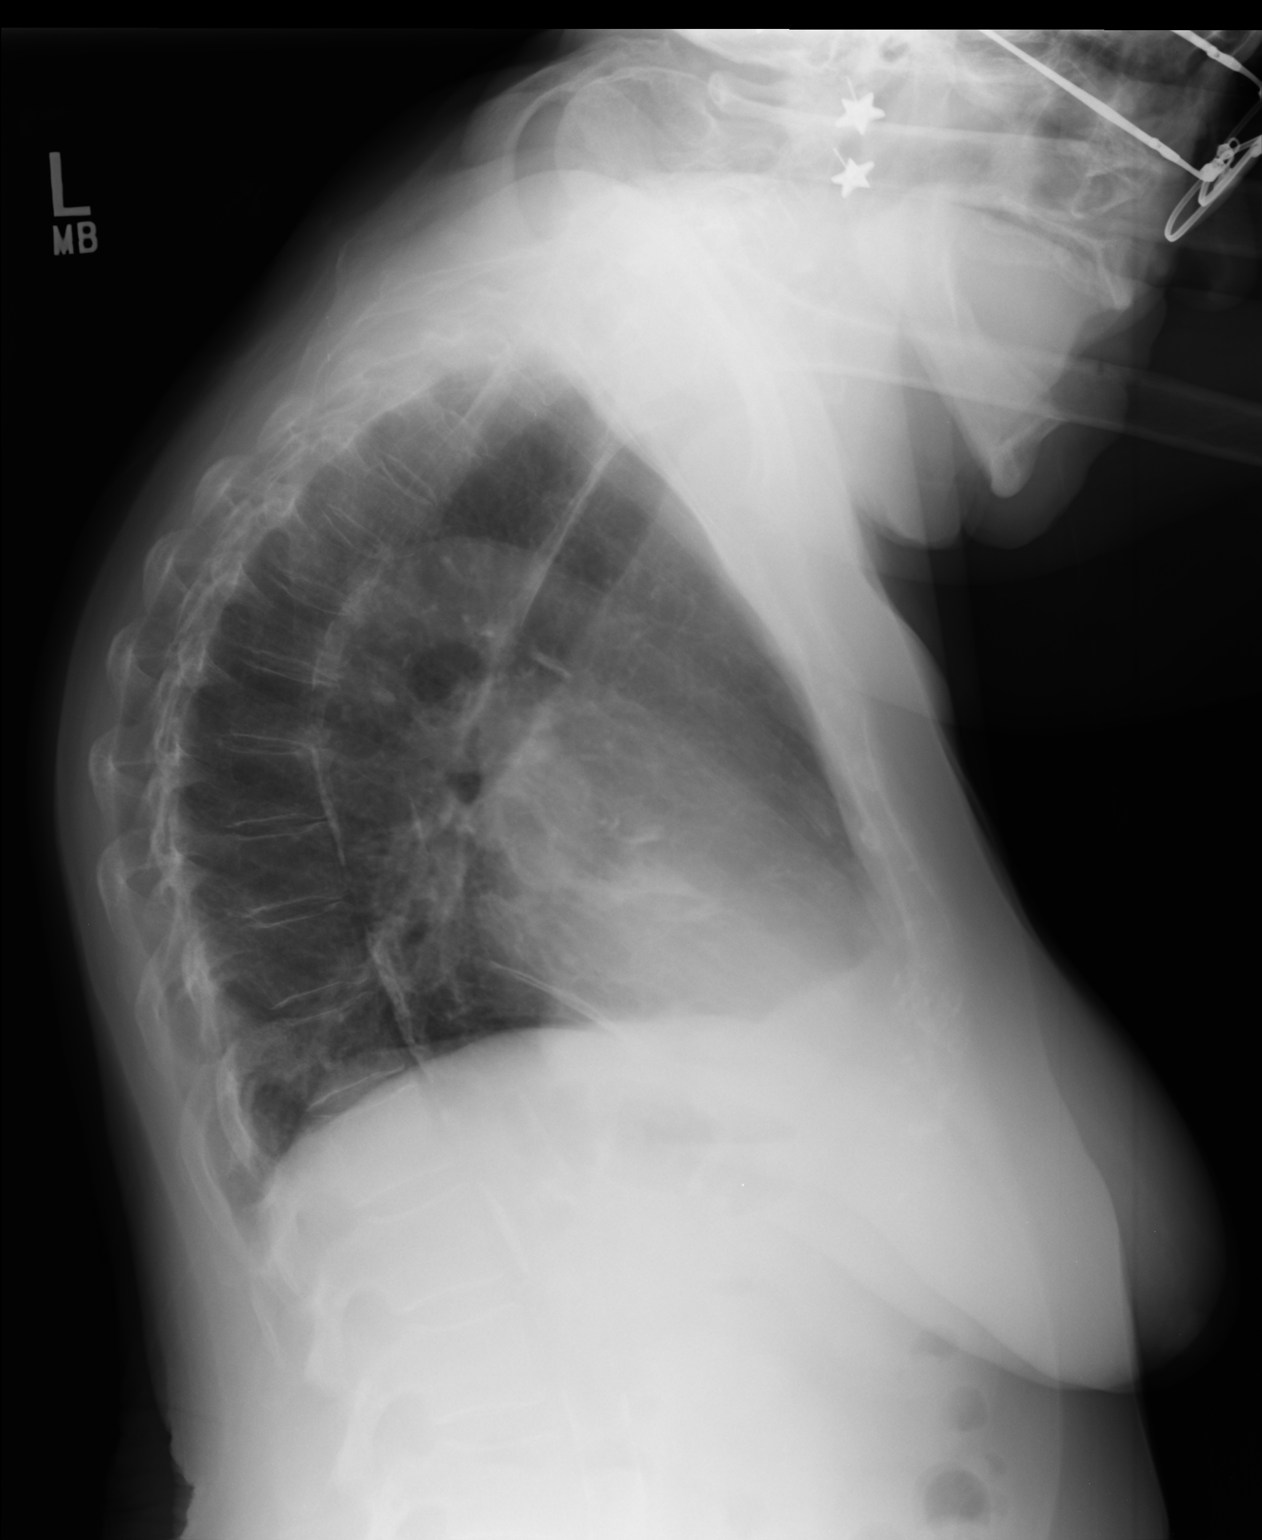

[lateral (2 of 2)]
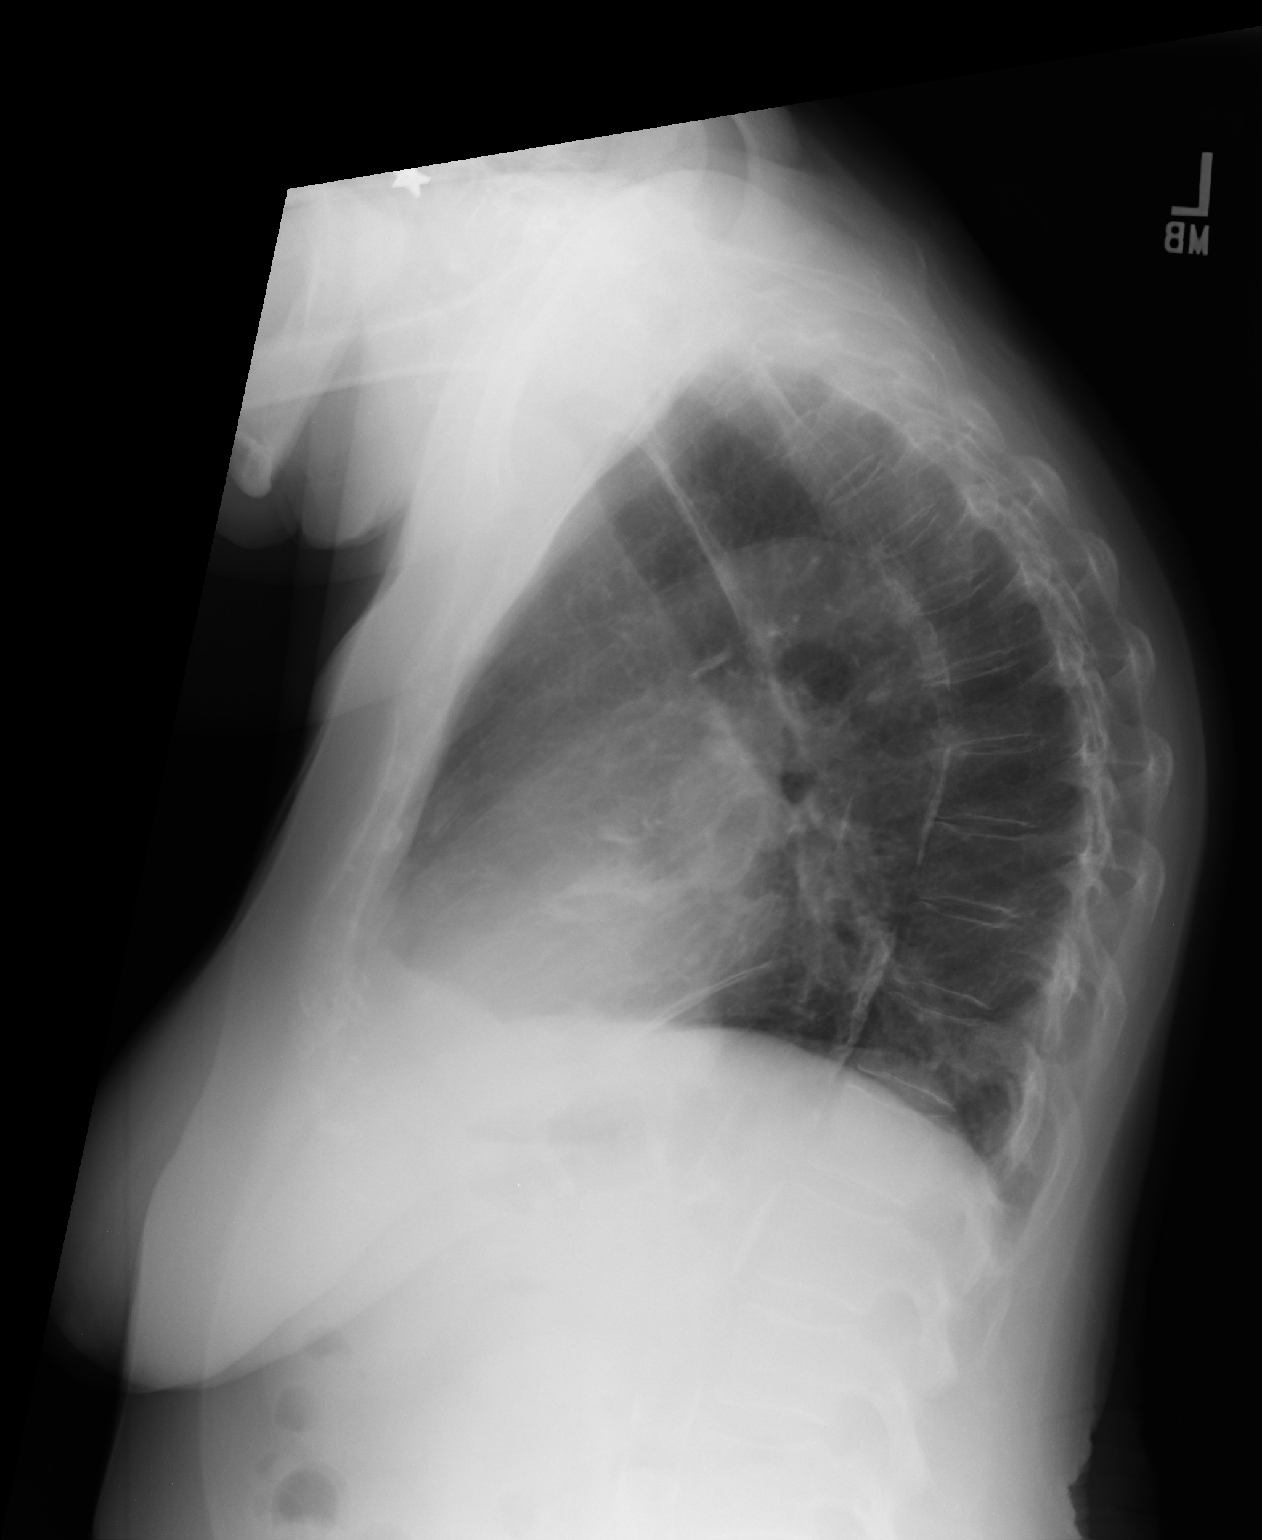

[3 of 3 positions shown; findings below may reference images not displayed]

FINDINGS: The heart size and mediastinal contours are within normal limits.
Bibasilar atelectasis present. There is no evidence of pulmonary
edema, consolidation, pneumothorax, nodule or pleural fluid. The
visualized skeletal structures are unremarkable.
IMPRESSION: Bibasilar atelectasis.  No active disease.

## 2015-08-12 IMAGING — CT CT HEAD W/O CM
2 series · 15 of 30 positions shown, 19 images · non-contrast
Comparison: Head CT May 23, 2014; brain MRI May 24, 2014

CLINICAL DATA: Status post cardiopulmonary resuscitation. Altered
mental status/ syncope

EXAM:
CT HEAD WITHOUT CONTRAST
TECHNIQUE: Contiguous axial images were obtained from the base of the skull
through the vertex without intravenous contrast.

[Series 2: head 5.0 h30s · axial · 0.42mm/px · z∈[-61,+64]mm · 13 of 31 slices shown, 17 images]
[im 3/31  brain]
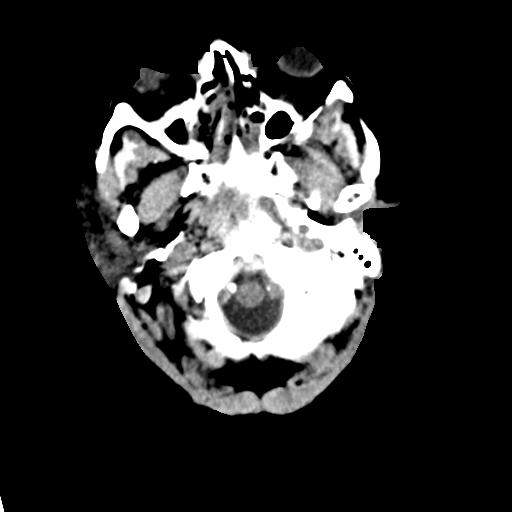
[im 3/31  bone]
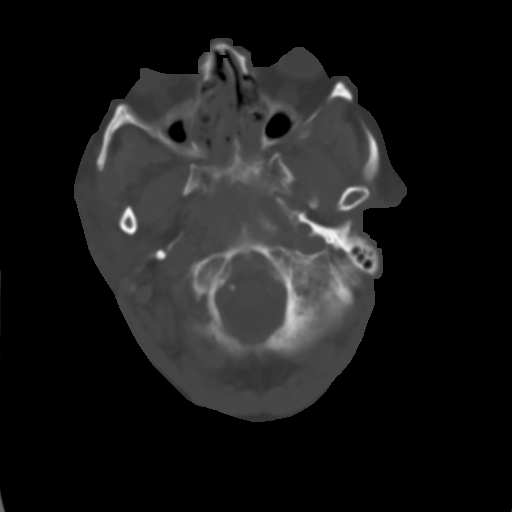
[im 5/31  brain]
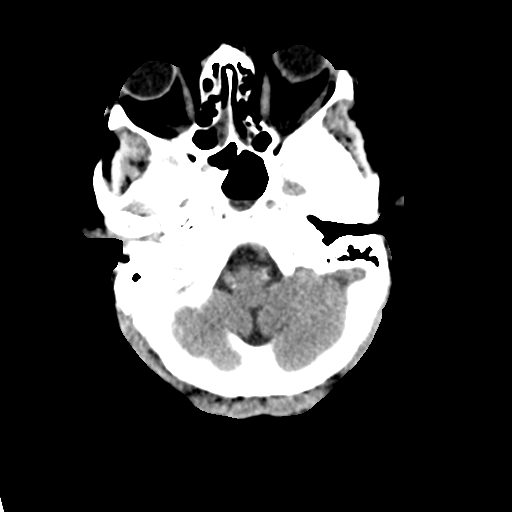
[im 7/31  brain]
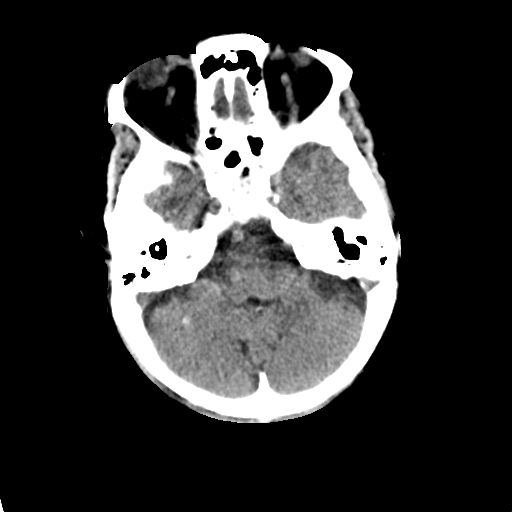
[im 9/31  brain]
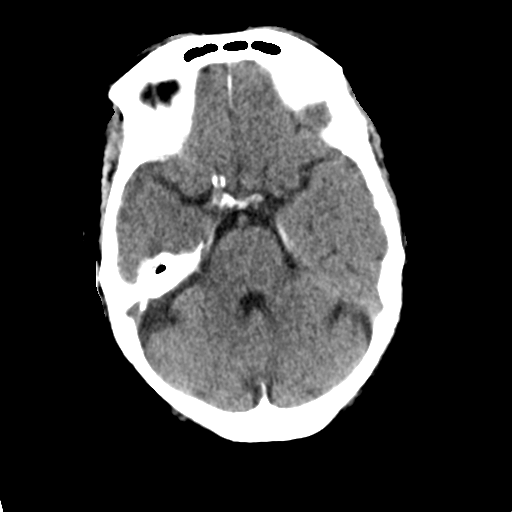
[im 11/31  brain]
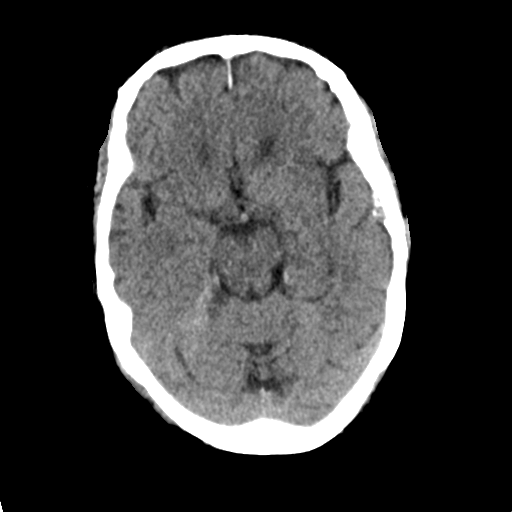
[im 11/31  bone]
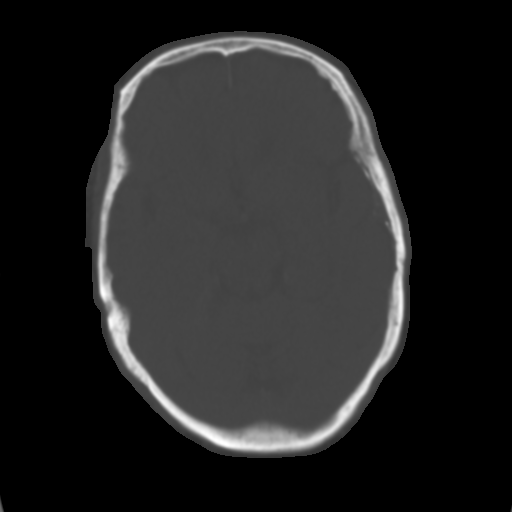
[im 13/31  brain]
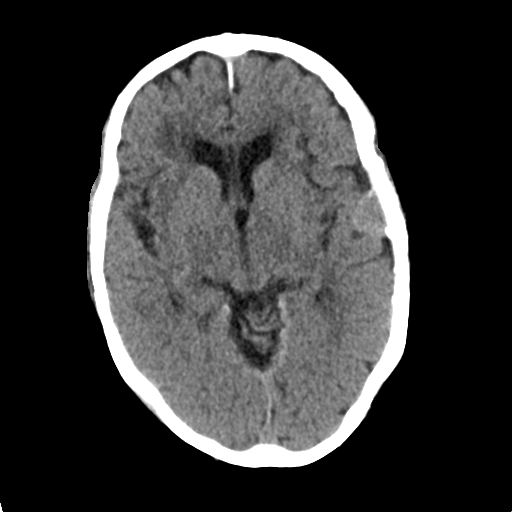
[im 16/31  brain]
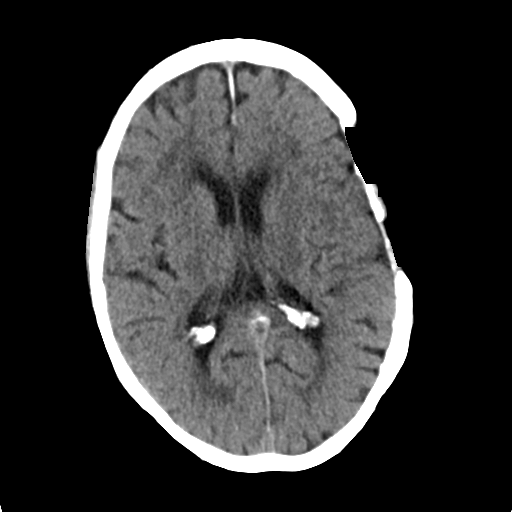
[im 18/31  brain]
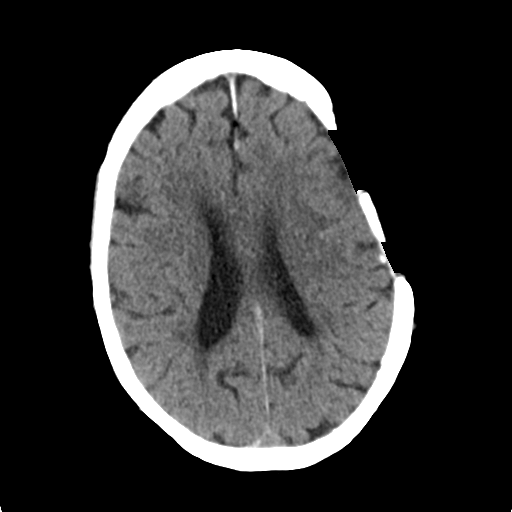
[im 20/31  brain]
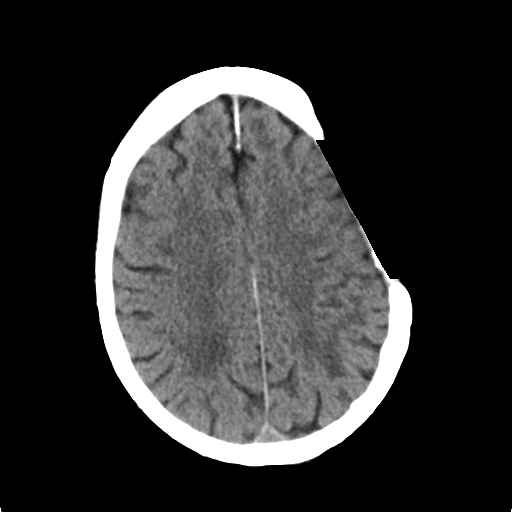
[im 20/31  bone]
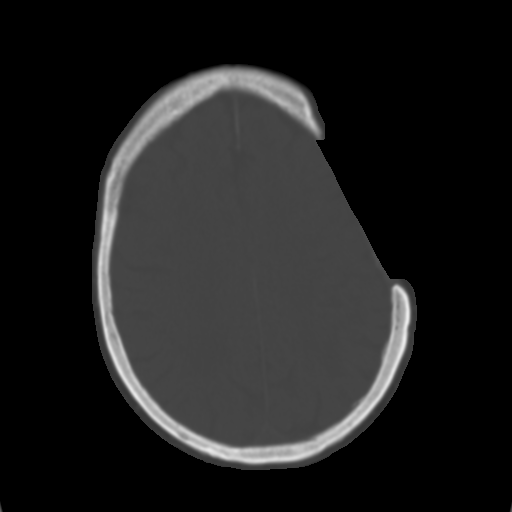
[im 22/31  brain]
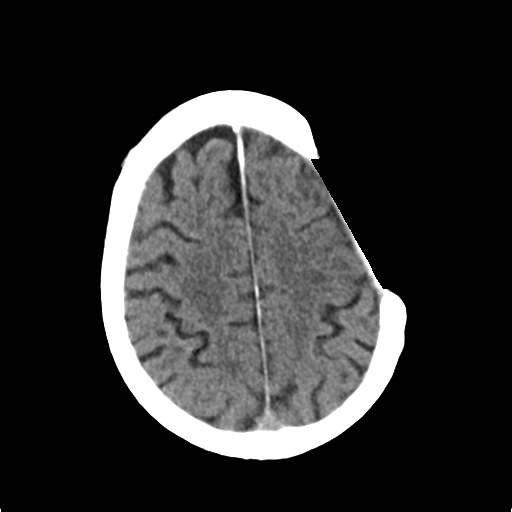
[im 24/31  brain]
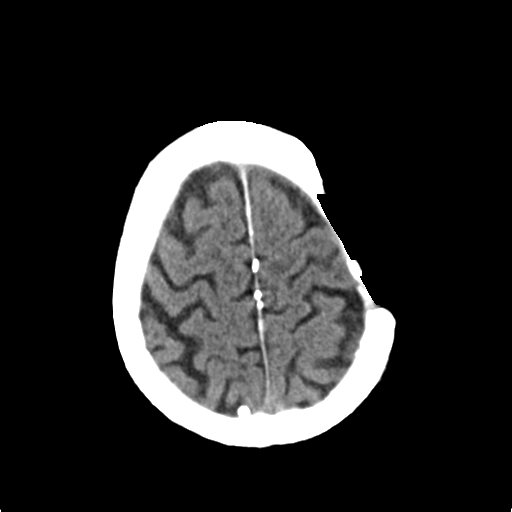
[im 26/31  brain]
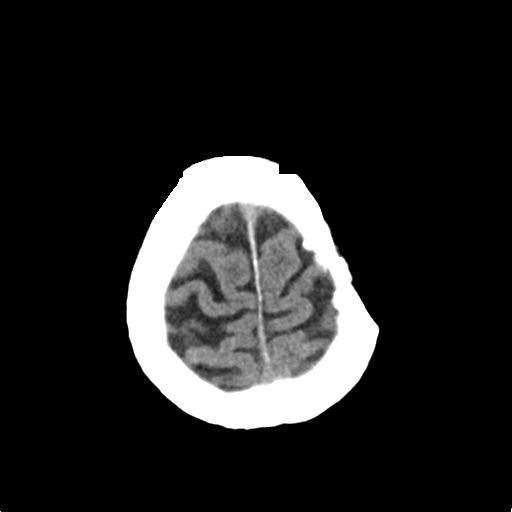
[im 28/31  brain]
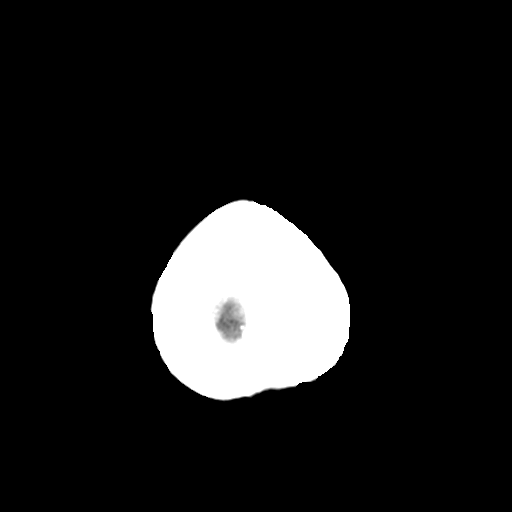
[im 28/31  bone]
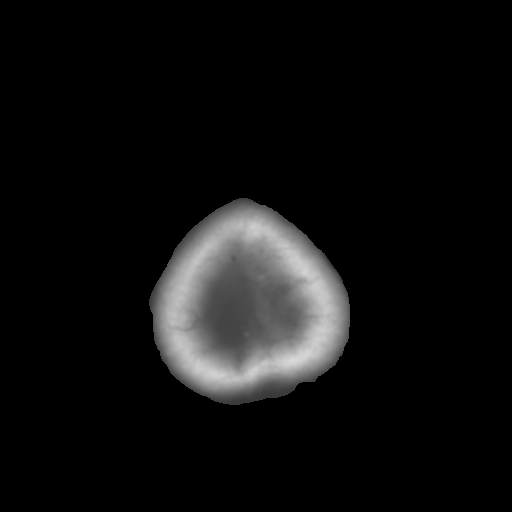

[Series 4: head 5.0 mpr · axial · 0.42mm/px · z∈[-110,-91]mm · 2 of 30 slices shown]
[im 3/30  brain]
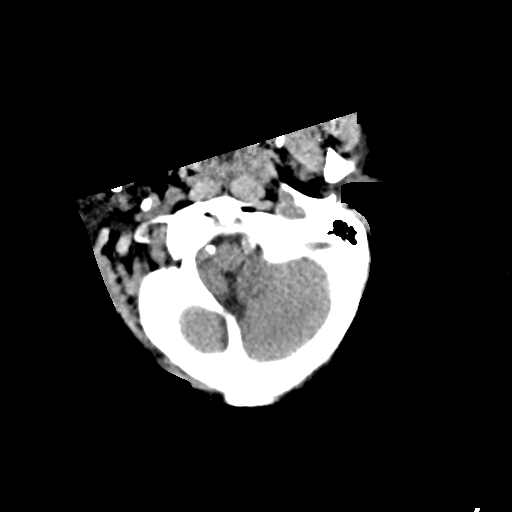
[im 7/30  brain]
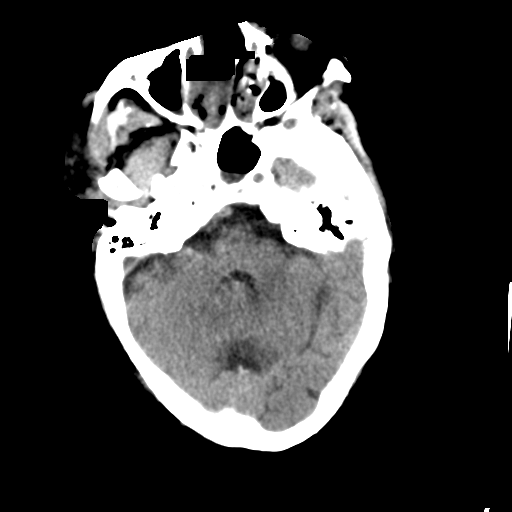

[15 of 30 positions shown; findings below may reference images not displayed]

FINDINGS: There is mild diffuse atrophy, stable. There is no intracranial
mass, hemorrhage, extra-axial fluid collection, or midline shift.
There is mild patchy small vessel disease in the centra semiovale
bilaterally. There is no new gray-white compartment lesion. No acute
infarct evident.

The patient is status post previous right frontal and temporal
craniotomy. Bony calvarium otherwise appears intact. Mastoid air
cells are clear. There is extensive debris throughout the
nasopharynx. There is an air-fluid level in the left maxillary
antrum. There are air-fluid levels in each maxillary antrum. Most of
the ethmoid air cells are opacified. There is a stable bony defect
in the lamina papyracea on the left.
IMPRESSION: Atrophy with mild stable periventricular small vessel disease. No
intracranial mass, hemorrhage, or acute appearing infarct. Bony
defect on the left from previous craniotomy. Extensive paranasal
sinus disease as well as diffuse opacification of the nares and
visualize nasopharynx. Question hemorrhage in these areas given the
recent CPR.

## 2015-08-13 IMAGING — CR DG CHEST 1V PORT
1 series · 1 of 1 positions shown · non-contrast
Comparison: 11/23/2014

CLINICAL DATA: Shortness of breath.  Central venous catheter.

EXAM:
PORTABLE CHEST - 1 VIEW

[AP]
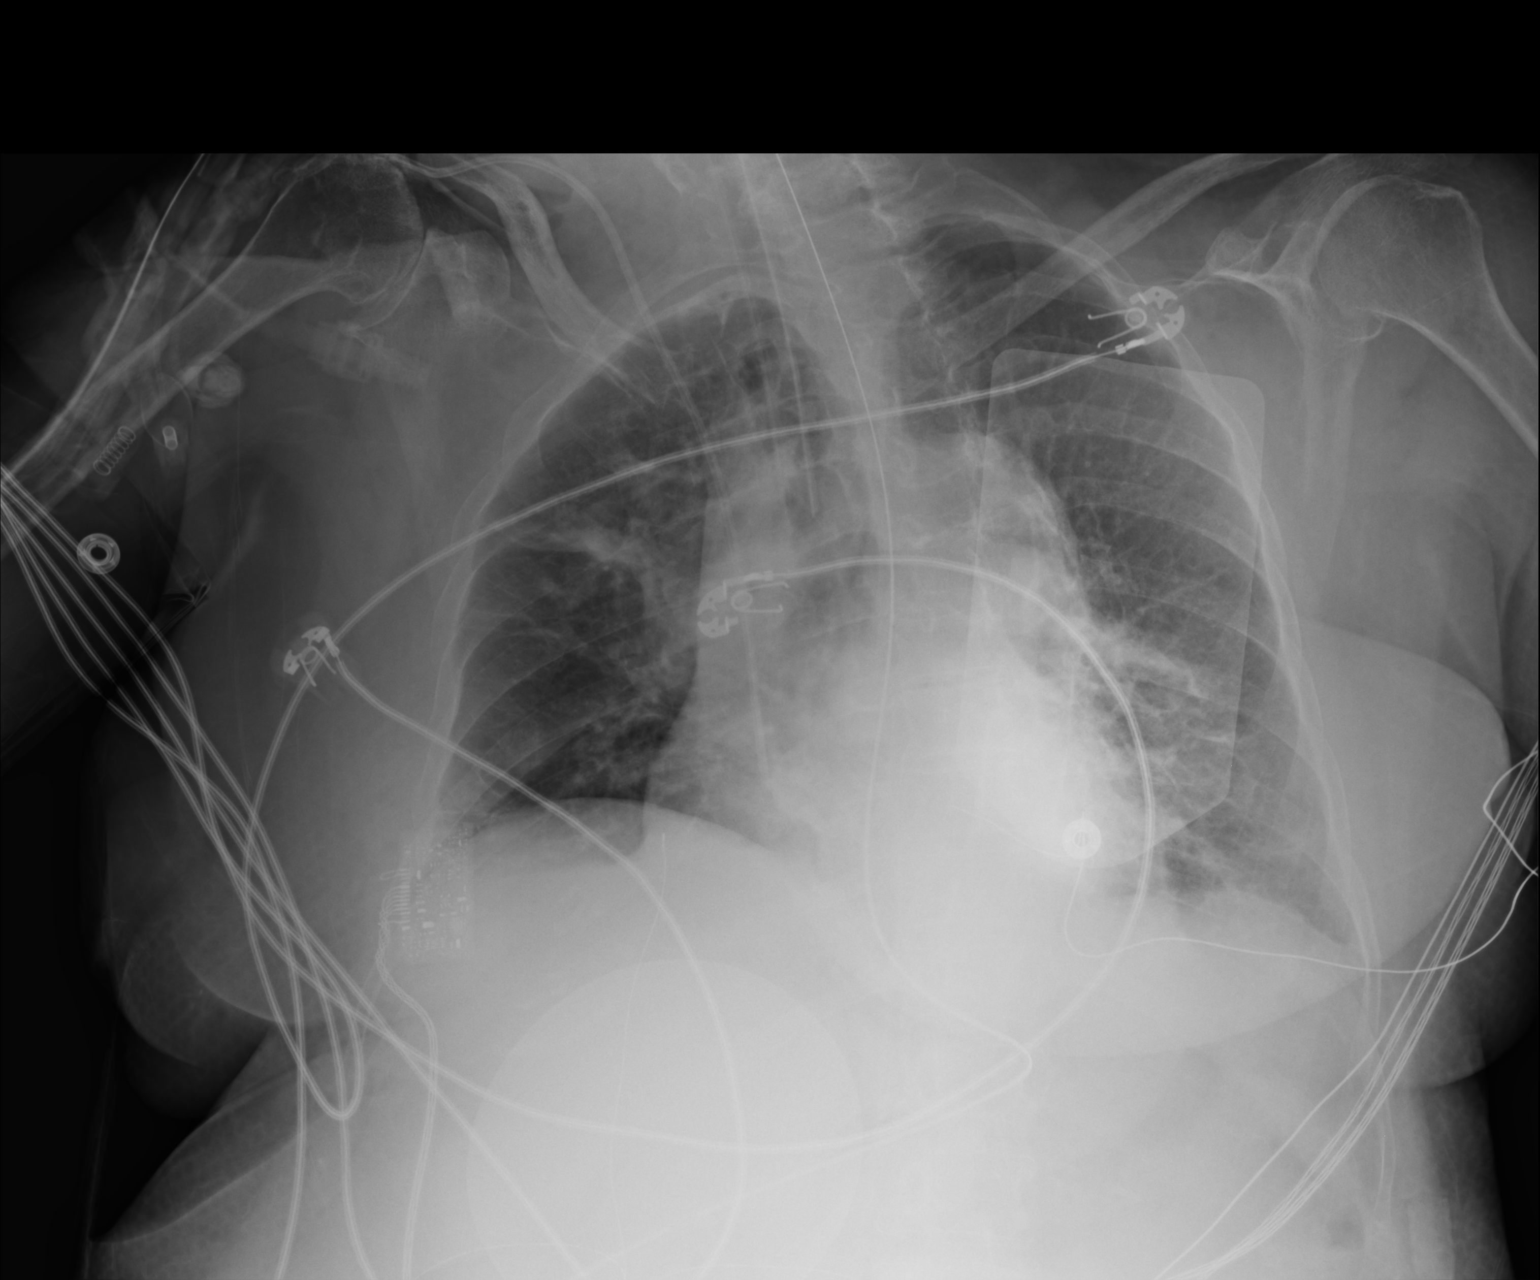

[1 of 1 positions shown; findings below may reference images not displayed]

FINDINGS: Endotracheal tube with tip measuring 3.3 cm above the carina.
Enteric tube is below the field of view but in the upper abdomen
consistent with location at least in the distal stomach. Right
central venous catheter with tip over the cavoatrial junction.
Normal heart size and pulmonary vascularity. Linear infiltration or
atelectasis in the right mid lung and left lung base. No blunting of
costophrenic angles. No pneumothorax.
IMPRESSION: Appliances appear in satisfactory location. Persistent linear
atelectasis or infiltration in the right mid lung and left lung
base.
